# Patient Record
Sex: Male | Born: 1945 | Race: White | Hispanic: No | Marital: Married | Smoking: Never smoker
Health system: Southern US, Community
[De-identification: ages and names within clinical notes are randomized; demographics above are authoritative.]

## PROBLEM LIST (undated history)

## (undated) DIAGNOSIS — R55 Syncope and collapse: Secondary | ICD-10-CM

## (undated) DIAGNOSIS — H269 Unspecified cataract: Secondary | ICD-10-CM

## (undated) DIAGNOSIS — N400 Enlarged prostate without lower urinary tract symptoms: Secondary | ICD-10-CM

## (undated) DIAGNOSIS — I509 Heart failure, unspecified: Secondary | ICD-10-CM

## (undated) DIAGNOSIS — M1990 Unspecified osteoarthritis, unspecified site: Secondary | ICD-10-CM

## (undated) DIAGNOSIS — I311 Chronic constrictive pericarditis: Secondary | ICD-10-CM

## (undated) DIAGNOSIS — I4819 Other persistent atrial fibrillation: Secondary | ICD-10-CM

## (undated) DIAGNOSIS — C649 Malignant neoplasm of unspecified kidney, except renal pelvis: Secondary | ICD-10-CM

## (undated) DIAGNOSIS — Z905 Acquired absence of kidney: Secondary | ICD-10-CM

## (undated) DIAGNOSIS — C801 Malignant (primary) neoplasm, unspecified: Secondary | ICD-10-CM

## (undated) DIAGNOSIS — K7581 Nonalcoholic steatohepatitis (NASH): Secondary | ICD-10-CM

## (undated) DIAGNOSIS — I2510 Atherosclerotic heart disease of native coronary artery without angina pectoris: Secondary | ICD-10-CM

## (undated) DIAGNOSIS — F329 Major depressive disorder, single episode, unspecified: Secondary | ICD-10-CM

## (undated) DIAGNOSIS — K769 Liver disease, unspecified: Secondary | ICD-10-CM

## (undated) DIAGNOSIS — N189 Chronic kidney disease, unspecified: Secondary | ICD-10-CM

## (undated) DIAGNOSIS — M1711 Unilateral primary osteoarthritis, right knee: Secondary | ICD-10-CM

## (undated) DIAGNOSIS — K7460 Unspecified cirrhosis of liver: Secondary | ICD-10-CM

## (undated) DIAGNOSIS — L570 Actinic keratosis: Secondary | ICD-10-CM

## (undated) DIAGNOSIS — IMO0002 Reserved for concepts with insufficient information to code with codable children: Secondary | ICD-10-CM

## (undated) DIAGNOSIS — R339 Retention of urine, unspecified: Secondary | ICD-10-CM

## (undated) DIAGNOSIS — F32A Depression, unspecified: Secondary | ICD-10-CM

## (undated) DIAGNOSIS — J189 Pneumonia, unspecified organism: Secondary | ICD-10-CM

## (undated) HISTORY — DX: Atherosclerotic heart disease of native coronary artery without angina pectoris: I25.10

## (undated) HISTORY — DX: Heart failure, unspecified: I50.9

## (undated) HISTORY — DX: Benign prostatic hyperplasia without lower urinary tract symptoms: N40.0

## (undated) HISTORY — DX: Nonalcoholic steatohepatitis (NASH): K75.81

## (undated) HISTORY — DX: Reserved for concepts with insufficient information to code with codable children: IMO0002

## (undated) HISTORY — DX: Syncope and collapse: R55

## (undated) HISTORY — PX: OTHER SURGICAL HISTORY: SHX169

## (undated) HISTORY — DX: Unspecified cirrhosis of liver: K74.60

## (undated) HISTORY — DX: Chronic constrictive pericarditis: I31.1

## (undated) HISTORY — DX: Unspecified osteoarthritis, unspecified site: M19.90

## (undated) HISTORY — DX: Major depressive disorder, single episode, unspecified: F32.9

## (undated) HISTORY — DX: Depression, unspecified: F32.A

## (undated) HISTORY — DX: Unspecified cataract: H26.9

## (undated) HISTORY — DX: Other persistent atrial fibrillation: I48.19

## (undated) HISTORY — PX: CARDIAC CATHETERIZATION: SHX172

## (undated) HISTORY — DX: Actinic keratosis: L57.0

---

## 1967-09-06 DIAGNOSIS — I311 Chronic constrictive pericarditis: Secondary | ICD-10-CM

## 1967-09-06 HISTORY — PX: PERICARDIECTOMY: SHX2214

## 1967-09-06 HISTORY — DX: Chronic constrictive pericarditis: I31.1

## 1973-09-05 DIAGNOSIS — C4441 Basal cell carcinoma of skin of scalp and neck: Secondary | ICD-10-CM

## 1973-09-05 HISTORY — DX: Basal cell carcinoma of skin of scalp and neck: C44.41

## 1993-09-05 HISTORY — PX: CHOLECYSTECTOMY: SHX55

## 1994-09-05 HISTORY — PX: CARPAL TUNNEL RELEASE: SHX101

## 1994-09-05 HISTORY — PX: CARPOMETACARPAL JOINT ARTHRODESIS: SUR50

## 1995-09-06 DIAGNOSIS — R55 Syncope and collapse: Secondary | ICD-10-CM

## 1995-09-06 HISTORY — DX: Syncope and collapse: R55

## 1999-09-06 DIAGNOSIS — K7460 Unspecified cirrhosis of liver: Secondary | ICD-10-CM

## 1999-09-06 HISTORY — DX: Unspecified cirrhosis of liver: K74.60

## 2000-01-17 ENCOUNTER — Ambulatory Visit (HOSPITAL_COMMUNITY): Admission: RE | Admit: 2000-01-17 | Discharge: 2000-01-17 | Payer: Self-pay | Admitting: Gastroenterology

## 2000-01-17 ENCOUNTER — Encounter: Payer: Self-pay | Admitting: Gastroenterology

## 2000-02-04 HISTORY — PX: ESOPHAGOGASTRODUODENOSCOPY: SHX1529

## 2000-02-04 HISTORY — PX: COLONOSCOPY: SHX174

## 2000-02-09 ENCOUNTER — Encounter: Payer: Self-pay | Admitting: Gastroenterology

## 2000-02-09 ENCOUNTER — Ambulatory Visit (HOSPITAL_COMMUNITY): Admission: RE | Admit: 2000-02-09 | Discharge: 2000-02-09 | Payer: Self-pay | Admitting: Gastroenterology

## 2000-02-09 ENCOUNTER — Encounter (INDEPENDENT_AMBULATORY_CARE_PROVIDER_SITE_OTHER): Payer: Self-pay | Admitting: Specialist

## 2000-02-16 ENCOUNTER — Other Ambulatory Visit: Admission: RE | Admit: 2000-02-16 | Discharge: 2000-02-16 | Payer: Self-pay | Admitting: Gastroenterology

## 2000-02-16 ENCOUNTER — Encounter (INDEPENDENT_AMBULATORY_CARE_PROVIDER_SITE_OTHER): Payer: Self-pay | Admitting: Specialist

## 2001-03-06 ENCOUNTER — Ambulatory Visit (HOSPITAL_BASED_OUTPATIENT_CLINIC_OR_DEPARTMENT_OTHER): Admission: RE | Admit: 2001-03-06 | Discharge: 2001-03-07 | Payer: Self-pay | Admitting: Orthopedic Surgery

## 2002-04-22 ENCOUNTER — Encounter: Payer: Self-pay | Admitting: Gastroenterology

## 2002-04-22 ENCOUNTER — Encounter: Admission: RE | Admit: 2002-04-22 | Discharge: 2002-04-22 | Payer: Self-pay | Admitting: Gastroenterology

## 2002-11-04 ENCOUNTER — Ambulatory Visit (HOSPITAL_COMMUNITY): Admission: RE | Admit: 2002-11-04 | Discharge: 2002-11-04 | Payer: Self-pay | Admitting: Gastroenterology

## 2002-11-04 ENCOUNTER — Encounter: Payer: Self-pay | Admitting: Gastroenterology

## 2004-12-15 ENCOUNTER — Ambulatory Visit: Payer: Self-pay | Admitting: Gastroenterology

## 2004-12-22 ENCOUNTER — Ambulatory Visit: Payer: Self-pay | Admitting: Internal Medicine

## 2004-12-24 ENCOUNTER — Ambulatory Visit: Payer: Self-pay | Admitting: Gastroenterology

## 2004-12-29 ENCOUNTER — Ambulatory Visit: Payer: Self-pay | Admitting: Family Medicine

## 2005-09-05 HISTORY — PX: HEMORROIDECTOMY: SUR656

## 2005-09-05 HISTORY — PX: KNEE SURGERY: SHX244

## 2006-07-06 ENCOUNTER — Ambulatory Visit: Payer: Self-pay | Admitting: Gastroenterology

## 2007-01-28 ENCOUNTER — Emergency Department: Payer: Self-pay | Admitting: Internal Medicine

## 2008-06-27 ENCOUNTER — Ambulatory Visit: Payer: Self-pay | Admitting: Cardiology

## 2008-07-09 ENCOUNTER — Ambulatory Visit: Payer: Self-pay

## 2008-07-09 ENCOUNTER — Encounter: Payer: Self-pay | Admitting: Cardiology

## 2008-07-09 ENCOUNTER — Ambulatory Visit: Payer: Self-pay | Admitting: Cardiology

## 2008-07-09 LAB — CONVERTED CEMR LAB
ALT: 33 units/L (ref 0–53)
AST: 27 units/L (ref 0–37)
Albumin: 4.2 g/dL (ref 3.5–5.2)
Alkaline Phosphatase: 34 units/L — ABNORMAL LOW (ref 39–117)
Bilirubin, Direct: 0.2 mg/dL (ref 0.0–0.3)
Cholesterol: 177 mg/dL (ref 0–200)
HDL: 34.1 mg/dL — ABNORMAL LOW (ref >39.0)
LDL Cholesterol: 120 mg/dL — ABNORMAL HIGH (ref 0–99)
Total Bilirubin: 1.4 mg/dL — ABNORMAL HIGH (ref 0.3–1.2)
Total CHOL/HDL Ratio: 5.2
Total Protein: 6.8 g/dL (ref 6.0–8.3)
Triglycerides: 114 mg/dL (ref 0–149)
VLDL: 23 mg/dL (ref 0–40)

## 2008-07-24 ENCOUNTER — Ambulatory Visit (HOSPITAL_COMMUNITY): Admission: RE | Admit: 2008-07-24 | Discharge: 2008-07-24 | Payer: Self-pay | Admitting: Orthopedic Surgery

## 2008-12-23 ENCOUNTER — Encounter: Payer: Self-pay | Admitting: Internal Medicine

## 2009-04-16 ENCOUNTER — Encounter (INDEPENDENT_AMBULATORY_CARE_PROVIDER_SITE_OTHER): Payer: Self-pay | Admitting: *Deleted

## 2009-10-22 DIAGNOSIS — R002 Palpitations: Secondary | ICD-10-CM | POA: Insufficient documentation

## 2009-10-22 DIAGNOSIS — I311 Chronic constrictive pericarditis: Secondary | ICD-10-CM | POA: Insufficient documentation

## 2009-11-10 ENCOUNTER — Ambulatory Visit: Payer: Self-pay | Admitting: Cardiology

## 2009-11-10 DIAGNOSIS — G4733 Obstructive sleep apnea (adult) (pediatric): Secondary | ICD-10-CM | POA: Insufficient documentation

## 2009-12-01 ENCOUNTER — Ambulatory Visit: Payer: Self-pay | Admitting: Cardiology

## 2009-12-01 ENCOUNTER — Encounter: Payer: Self-pay | Admitting: Cardiology

## 2009-12-01 ENCOUNTER — Ambulatory Visit: Payer: Self-pay | Admitting: Pulmonary Disease

## 2009-12-01 ENCOUNTER — Ambulatory Visit (HOSPITAL_COMMUNITY): Admission: RE | Admit: 2009-12-01 | Discharge: 2009-12-01 | Payer: Self-pay | Admitting: Cardiology

## 2009-12-01 ENCOUNTER — Ambulatory Visit: Payer: Self-pay

## 2009-12-03 ENCOUNTER — Telehealth (INDEPENDENT_AMBULATORY_CARE_PROVIDER_SITE_OTHER): Payer: Self-pay | Admitting: *Deleted

## 2009-12-08 LAB — CONVERTED CEMR LAB
BUN: 22 mg/dL (ref 6–23)
CO2: 29 meq/L (ref 19–32)
Calcium: 9 mg/dL (ref 8.4–10.5)
Chloride: 106 meq/L (ref 96–112)
Creatinine, Ser: 0.8 mg/dL (ref 0.4–1.5)
GFR calc non Af Amer: 103.59 mL/min (ref >60)
Glucose, Bld: 131 mg/dL — ABNORMAL HIGH (ref 70–99)
Magnesium: 2.4 mg/dL (ref 1.5–2.5)
Potassium: 4.2 meq/L (ref 3.5–5.1)
Sodium: 142 meq/L (ref 135–145)
TSH: 1.12 microintl units/mL (ref 0.35–5.50)

## 2009-12-09 ENCOUNTER — Ambulatory Visit: Payer: Self-pay | Admitting: Cardiology

## 2009-12-09 DIAGNOSIS — I480 Paroxysmal atrial fibrillation: Secondary | ICD-10-CM | POA: Insufficient documentation

## 2009-12-09 DIAGNOSIS — I4891 Unspecified atrial fibrillation: Secondary | ICD-10-CM

## 2009-12-21 ENCOUNTER — Ambulatory Visit (HOSPITAL_BASED_OUTPATIENT_CLINIC_OR_DEPARTMENT_OTHER): Admission: RE | Admit: 2009-12-21 | Discharge: 2009-12-21 | Payer: Self-pay | Admitting: Pulmonary Disease

## 2009-12-21 ENCOUNTER — Encounter: Payer: Self-pay | Admitting: Pulmonary Disease

## 2009-12-30 ENCOUNTER — Ambulatory Visit: Payer: Self-pay | Admitting: Internal Medicine

## 2010-01-02 ENCOUNTER — Ambulatory Visit: Payer: Self-pay | Admitting: Pulmonary Disease

## 2010-01-04 ENCOUNTER — Telehealth (INDEPENDENT_AMBULATORY_CARE_PROVIDER_SITE_OTHER): Payer: Self-pay | Admitting: *Deleted

## 2010-01-07 ENCOUNTER — Ambulatory Visit: Payer: Self-pay | Admitting: Pulmonary Disease

## 2010-01-07 DIAGNOSIS — G2581 Restless legs syndrome: Secondary | ICD-10-CM | POA: Insufficient documentation

## 2010-01-26 ENCOUNTER — Ambulatory Visit: Payer: Self-pay | Admitting: Family Medicine

## 2010-02-03 ENCOUNTER — Ambulatory Visit: Payer: Self-pay | Admitting: Internal Medicine

## 2010-04-12 ENCOUNTER — Encounter (INDEPENDENT_AMBULATORY_CARE_PROVIDER_SITE_OTHER): Payer: Self-pay | Admitting: *Deleted

## 2010-04-28 ENCOUNTER — Ambulatory Visit: Payer: Self-pay | Admitting: Internal Medicine

## 2010-04-28 DIAGNOSIS — IMO0002 Reserved for concepts with insufficient information to code with codable children: Secondary | ICD-10-CM

## 2010-04-28 DIAGNOSIS — I4892 Unspecified atrial flutter: Secondary | ICD-10-CM

## 2010-04-28 HISTORY — DX: Reserved for concepts with insufficient information to code with codable children: IMO0002

## 2010-04-28 HISTORY — DX: Unspecified atrial flutter: I48.92

## 2010-04-29 ENCOUNTER — Encounter: Payer: Self-pay | Admitting: Internal Medicine

## 2010-05-19 ENCOUNTER — Ambulatory Visit: Payer: Self-pay | Admitting: Internal Medicine

## 2010-06-04 ENCOUNTER — Ambulatory Visit: Payer: Self-pay | Admitting: Internal Medicine

## 2010-06-10 ENCOUNTER — Ambulatory Visit: Payer: Self-pay | Admitting: Internal Medicine

## 2010-06-11 LAB — CONVERTED CEMR LAB
BUN: 17 mg/dL (ref 6–23)
Basophils Absolute: 0 10*3/uL (ref 0.0–0.1)
Basophils Relative: 0.3 % (ref 0.0–3.0)
CO2: 25 meq/L (ref 19–32)
Calcium: 9 mg/dL (ref 8.4–10.5)
Chloride: 109 meq/L (ref 96–112)
Creatinine, Ser: 0.8 mg/dL (ref 0.4–1.5)
Eosinophils Absolute: 0.3 10*3/uL (ref 0.0–0.7)
Eosinophils Relative: 2.6 % (ref 0.0–5.0)
GFR calc non Af Amer: 99.12 mL/min (ref >60)
Glucose, Bld: 64 mg/dL — ABNORMAL LOW (ref 70–99)
HCT: 43.2 % (ref 39.0–52.0)
Hemoglobin: 15.4 g/dL (ref 13.0–17.0)
INR: 0.8 (ref 0.8–1.0)
Lymphocytes Relative: 26.4 % (ref 12.0–46.0)
Lymphs Abs: 2.7 10*3/uL (ref 0.7–4.0)
MCHC: 35.5 g/dL (ref 30.0–36.0)
MCV: 91 fL (ref 78.0–100.0)
Monocytes Absolute: 1 10*3/uL (ref 0.1–1.0)
Monocytes Relative: 10.2 % (ref 3.0–12.0)
Neutro Abs: 6.2 10*3/uL (ref 1.4–7.7)
Neutrophils Relative %: 60.5 % (ref 43.0–77.0)
Platelets: 190 10*3/uL (ref 150.0–400.0)
Potassium: 3.9 meq/L (ref 3.5–5.1)
Prothrombin Time: 8.5 s — ABNORMAL LOW (ref 9.7–11.8)
RBC: 4.75 M/uL (ref 4.22–5.81)
RDW: 13.3 % (ref 11.5–14.6)
Sodium: 140 meq/L (ref 135–145)
WBC: 10.2 10*3/uL (ref 4.5–10.5)
aPTT: 28.4 s (ref 21.7–28.8)

## 2010-06-15 ENCOUNTER — Ambulatory Visit: Payer: Self-pay | Admitting: Cardiology

## 2010-06-16 ENCOUNTER — Ambulatory Visit (HOSPITAL_COMMUNITY): Admission: RE | Admit: 2010-06-16 | Discharge: 2010-06-16 | Payer: Self-pay | Admitting: Cardiovascular Disease

## 2010-06-16 ENCOUNTER — Encounter: Payer: Self-pay | Admitting: Cardiology

## 2010-06-17 ENCOUNTER — Ambulatory Visit: Payer: Self-pay | Admitting: Internal Medicine

## 2010-06-17 ENCOUNTER — Ambulatory Visit (HOSPITAL_COMMUNITY): Admission: RE | Admit: 2010-06-17 | Discharge: 2010-06-18 | Payer: Self-pay | Admitting: Internal Medicine

## 2010-07-23 ENCOUNTER — Ambulatory Visit: Payer: Self-pay | Admitting: Internal Medicine

## 2010-08-07 ENCOUNTER — Telehealth: Payer: Self-pay | Admitting: Physician Assistant

## 2010-08-10 ENCOUNTER — Telehealth: Payer: Self-pay | Admitting: Internal Medicine

## 2010-08-16 ENCOUNTER — Encounter: Payer: Self-pay | Admitting: Internal Medicine

## 2010-08-19 ENCOUNTER — Ambulatory Visit: Payer: Self-pay | Admitting: Family Medicine

## 2010-08-19 DIAGNOSIS — R61 Generalized hyperhidrosis: Secondary | ICD-10-CM | POA: Insufficient documentation

## 2010-08-27 ENCOUNTER — Telehealth: Payer: Self-pay | Admitting: Internal Medicine

## 2010-08-31 ENCOUNTER — Ambulatory Visit: Payer: Self-pay | Admitting: Family Medicine

## 2010-08-31 ENCOUNTER — Telehealth: Payer: Self-pay | Admitting: Internal Medicine

## 2010-08-31 LAB — CONVERTED CEMR LAB
ALT: 48 units/L (ref 0–53)
AST: 38 units/L — ABNORMAL HIGH (ref 0–37)
Albumin: 4.2 g/dL (ref 3.5–5.2)
Alkaline Phosphatase: 43 units/L (ref 39–117)
BUN: 25 mg/dL — ABNORMAL HIGH (ref 6–23)
Basophils Absolute: 0 10*3/uL (ref 0.0–0.1)
Basophils Relative: 0.4 % (ref 0.0–3.0)
Bilirubin, Direct: 0.2 mg/dL (ref 0.0–0.3)
CO2: 26 meq/L (ref 19–32)
Calcium: 9.3 mg/dL (ref 8.4–10.5)
Chloride: 108 meq/L (ref 96–112)
Cholesterol: 186 mg/dL (ref 0–200)
Creatinine, Ser: 0.8 mg/dL (ref 0.4–1.5)
Direct LDL: 117.8 mg/dL
Eosinophils Absolute: 0.3 10*3/uL (ref 0.0–0.7)
Eosinophils Relative: 3.8 % (ref 0.0–5.0)
GFR calc non Af Amer: 97.69 mL/min (ref >60.00)
Glucose, Bld: 107 mg/dL — ABNORMAL HIGH (ref 70–99)
HCT: 42 % (ref 39.0–52.0)
HDL: 37.7 mg/dL — ABNORMAL LOW (ref >39.00)
Hemoglobin: 14.6 g/dL (ref 13.0–17.0)
Lymphocytes Relative: 22.5 % (ref 12.0–46.0)
Lymphs Abs: 1.9 10*3/uL (ref 0.7–4.0)
MCHC: 34.7 g/dL (ref 30.0–36.0)
MCV: 90.5 fL (ref 78.0–100.0)
Monocytes Absolute: 0.7 10*3/uL (ref 0.1–1.0)
Monocytes Relative: 8.7 % (ref 3.0–12.0)
Neutro Abs: 5.4 10*3/uL (ref 1.4–7.7)
Neutrophils Relative %: 64.6 % (ref 43.0–77.0)
PSA: 2.03 ng/mL (ref 0.10–4.00)
Platelets: 158 10*3/uL (ref 150.0–400.0)
Potassium: 4.4 meq/L (ref 3.5–5.1)
RBC: 4.64 M/uL (ref 4.22–5.81)
RDW: 13.8 % (ref 11.5–14.6)
Sodium: 142 meq/L (ref 135–145)
TSH: 2.17 microintl units/mL (ref 0.35–5.50)
Total Bilirubin: 1.2 mg/dL (ref 0.3–1.2)
Total CHOL/HDL Ratio: 5
Total Protein: 6.2 g/dL (ref 6.0–8.3)
Triglycerides: 203 mg/dL — ABNORMAL HIGH (ref 0.0–149.0)
VLDL: 40.6 mg/dL — ABNORMAL HIGH (ref 0.0–40.0)
WBC: 8.4 10*3/uL (ref 4.5–10.5)

## 2010-09-02 ENCOUNTER — Ambulatory Visit: Admission: RE | Admit: 2010-09-02 | Discharge: 2010-09-02 | Payer: Self-pay | Source: Home / Self Care

## 2010-09-02 LAB — CONVERTED CEMR LAB: POC INR: 1.4

## 2010-09-08 ENCOUNTER — Ambulatory Visit
Admission: RE | Admit: 2010-09-08 | Discharge: 2010-09-08 | Payer: Self-pay | Source: Home / Self Care | Attending: Internal Medicine | Admitting: Internal Medicine

## 2010-09-08 ENCOUNTER — Ambulatory Visit
Admission: RE | Admit: 2010-09-08 | Discharge: 2010-09-08 | Payer: Self-pay | Source: Home / Self Care | Attending: Family Medicine | Admitting: Family Medicine

## 2010-09-08 ENCOUNTER — Ambulatory Visit: Admit: 2010-09-08 | Payer: Self-pay

## 2010-09-08 ENCOUNTER — Ambulatory Visit: Admission: RE | Admit: 2010-09-08 | Discharge: 2010-09-08 | Payer: Self-pay | Source: Home / Self Care

## 2010-09-08 ENCOUNTER — Telehealth (INDEPENDENT_AMBULATORY_CARE_PROVIDER_SITE_OTHER): Payer: Self-pay

## 2010-09-08 LAB — CONVERTED CEMR LAB: POC INR: 1.7

## 2010-09-09 ENCOUNTER — Encounter: Payer: Self-pay | Admitting: Internal Medicine

## 2010-09-09 ENCOUNTER — Ambulatory Visit: Admission: RE | Admit: 2010-09-09 | Discharge: 2010-09-09 | Payer: Self-pay | Source: Home / Self Care

## 2010-09-09 ENCOUNTER — Encounter (HOSPITAL_COMMUNITY)
Admission: RE | Admit: 2010-09-09 | Discharge: 2010-10-05 | Payer: Self-pay | Source: Home / Self Care | Attending: Internal Medicine | Admitting: Internal Medicine

## 2010-09-09 ENCOUNTER — Ambulatory Visit: Payer: Self-pay | Admitting: Cardiology

## 2010-09-10 ENCOUNTER — Encounter: Payer: Self-pay | Admitting: Internal Medicine

## 2010-09-13 ENCOUNTER — Encounter: Payer: Self-pay | Admitting: Internal Medicine

## 2010-09-14 ENCOUNTER — Encounter (INDEPENDENT_AMBULATORY_CARE_PROVIDER_SITE_OTHER): Payer: Self-pay | Admitting: Pharmacist

## 2010-09-14 ENCOUNTER — Telehealth (INDEPENDENT_AMBULATORY_CARE_PROVIDER_SITE_OTHER): Payer: Self-pay | Admitting: *Deleted

## 2010-09-15 ENCOUNTER — Encounter: Payer: Self-pay | Admitting: Cardiology

## 2010-09-15 ENCOUNTER — Ambulatory Visit: Admit: 2010-09-15 | Payer: Self-pay

## 2010-09-15 DIAGNOSIS — I4819 Other persistent atrial fibrillation: Secondary | ICD-10-CM

## 2010-09-15 HISTORY — DX: Other persistent atrial fibrillation: I48.19

## 2010-09-15 HISTORY — PX: OTHER SURGICAL HISTORY: SHX169

## 2010-09-15 HISTORY — PX: ATRIAL FIBRILLATION ABLATION: EP1191

## 2010-09-15 LAB — CONVERTED CEMR LAB
POC INR: 2.7
Prothrombin Time: 33 s

## 2010-09-16 ENCOUNTER — Encounter: Payer: Self-pay | Admitting: Internal Medicine

## 2010-09-17 ENCOUNTER — Encounter (INDEPENDENT_AMBULATORY_CARE_PROVIDER_SITE_OTHER): Payer: Self-pay | Admitting: *Deleted

## 2010-09-21 ENCOUNTER — Ambulatory Visit: Admission: RE | Admit: 2010-09-21 | Discharge: 2010-09-21 | Payer: Self-pay | Source: Home / Self Care

## 2010-09-21 LAB — CONVERTED CEMR LAB: POC INR: 1.1

## 2010-09-22 ENCOUNTER — Encounter: Payer: Self-pay | Admitting: Internal Medicine

## 2010-09-22 ENCOUNTER — Ambulatory Visit
Admission: RE | Admit: 2010-09-22 | Discharge: 2010-09-22 | Payer: Self-pay | Source: Home / Self Care | Attending: Internal Medicine | Admitting: Internal Medicine

## 2010-09-27 ENCOUNTER — Encounter: Payer: Self-pay | Admitting: Internal Medicine

## 2010-09-28 ENCOUNTER — Ambulatory Visit: Admit: 2010-09-28 | Payer: Self-pay

## 2010-10-07 NOTE — Progress Notes (Signed)
----   Converted from flag ---- ---- 08/27/2010 4:02 PM, Janan Halter, RN, BSN wrote: pt needs to start Coumadin per Dr Charise Killian prior to afib ablation Thanks Claiborne Billings currently on Pradaxa ------------------------------  Phone Note Outgoing Call   Call placed by: Porfirio Oar PharmD,  August 27, 2010 4:21 PM Call placed to: Patient's Wife Summary of Call: Spoke with pt's wife.  Explained pt will need to switch from Pradaxa to Coumadin.  Stated pt will probably wait until beginning of next week to change.  This is okay.  Instructed to take Pradaxa and Coumadin x 3 days then just take Coumadin.  Appt make for 1 week after starting Coumadin.   Initial call taken by: Porfirio Oar PharmD,  August 27, 2010 4:24 PM    New/Updated Medications: WARFARIN SODIUM 5 MG TABS (WARFARIN SODIUM) Use as directed by Anticoagulation Clinic Prescriptions: WARFARIN SODIUM 5 MG TABS (WARFARIN SODIUM) Use as directed by Anticoagulation Clinic  #45 x 1   Entered by:   Porfirio Oar PharmD   Authorized by:   [name], MD   Signed by:   Porfirio Oar PharmD on 08/27/2010   Method used:   Electronically to        [facility] [phone]* (retail)       [address] [place],   [address]       Ph: [phone] or [phone]       Fax: [phone]   RxID:   [id number]   Appended Document:  Per Karen Kays, Dr Omelia Blackwater nurse at [facility] pt is S/C for Ablation on 09/15/10 at [facility]. Please send/ fax all INR's to her at [phone] and to EP group at [phone]

## 2010-10-07 NOTE — Assessment & Plan Note (Signed)
Summary: eval afib   Visit Type:  Follow-up Referring Provider:  Dr. Jenell Milliner Primary Provider:  Teresa Pelton   History of Present Illness: Mr Busler is a pleasant 65 yo WM with a h/o prior surgery for constrictive pericarditis and paroxysmal atrial fibrillation who presents today for Ep consultation regarding his afib.  He reports initially having atrial fibrillation in the late 1980s.  He was placed on coumadin and quinidine after evaluation at [facility].  He did well, without afib for several years.  He then stopped quinidine in the early 1990s.  He reports having increasing frequency and duration of afib.  He feels that his afib has been most noticable over the past 6 months.  He is unaware of triggers or precipitants for his afib.  He does not drink ETOH.  He has been recently tested for sleep apnea.   During his afib, he reports symptoms of nervousness, SOB, palpitations, dizziness, and fatigue.  Episodes typically last several hours.  The patient denies symptoms of chest pain, orthopnea, PND, lower extremity edema,  presyncope, syncope, or neurologic sequela. The patient is tolerating medications without difficulties and is otherwise without complaint today.   Current Medications (verified): 1)  Flomax 0.4 Mg Caps (Tamsulosin Hcl) .Marland Kitchen.. 1 Tab Two Times A Day 2)  Zoloft 50 Mg Tabs (Sertraline Hcl) .Marland Kitchen.. 1 Tab Once Daily 3)  Triazolam 0.25 Mg Tabs (Triazolam) .Marland Kitchen.. 1 Tab At Bedtime 4)  Aspirin 81 Mg Tbec (Aspirin) .... Take One Tablet By Mouth Daily 5)  Diltiazem Hcl Er Beads 240 Mg Xr24h-Cap (Diltiazem Hcl Er Beads) .Marland Kitchen.. 1 Once Daily  Allergies (verified): No Known Drug Allergies  Past History:  Past Medical History: Paroxysmal atrial fibrillation + rheumatic fever as a child CONSTRICTIVE PERICARDITIS (unknown cause) s/p pericardectomy 1969 Depression DJD BPH ?liver cirrhosis diagnosed at time of cholecystectomy recurrent syncope s/p Tilt study by Dr [name] 1997 (likely due to  dysautonomia) Gout  Past Surgical History: Pericardioectomy 1969 at [facility] he had osteoarthritis with left  knee surgery in 2007,  hemorrhoidectomy in 2007, cholecystectomy in 1995,  right thumb surgery for osteoarthritis  Family History: Reviewed history from 10/22/2009 and no changes required.  Positive for his father having a heart attack at age  73.   Social History: Pt lives in [place] with spouse.  He does not smoke.   He does not drink.   He enjoys walking on a regular basis.   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    Review of Systems       All systems are reviewed and negative except as listed in the HPI.   Vital Signs:  Patient profile:   65 year old male Height:      67 inches Weight:      169 pounds BMI:     26.56 Pulse rate:   64 / minute BP sitting:   120 / 70  (left arm)  Vitals Entered By: Margaretmary Bayley CMA (December 30, 2009 2:46 PM)  Physical Exam  General:  Well developed, well nourished, in no acute distress. Head:  normocephalic and atraumatic Eyes:  PERRLA/EOM intact; conjunctiva and lids normal. Mouth:  Teeth, gums and palate normal. Oral mucosa normal. Neck:  Neck supple, no JVD. No masses, thyromegaly or abnormal cervical nodes. Lungs:  Clear bilaterally to auscultation and percussion. Heart:  Non-displaced PMI, chest non-tender; regular rate and rhythm, S1, S2 without murmurs, rubs or gallops. Carotid upstroke normal,  no bruit. Normal abdominal aortic size, no bruits. Femorals normal pulses, no bruits. Pedals normal pulses. No edema, no varicosities. Abdomen:  Bowel sounds positive; abdomen soft and non-tender without masses, organomegaly, or hernias noted. No hepatosplenomegaly. Msk:  Back normal, normal gait. Muscle strength and tone normal. Pulses:  pulses normal in all 4 extremities Extremities:  No clubbing or cyanosis. Neurologic:  Alert and oriented x 3. Skin:  Intact without lesions or  rashes. Cervical Nodes:  no significant adenopathy Psych:  Normal affect.   Stress Echocardiogram  Procedure date:  07/09/2008  Findings:        CONCLUSIONS    SUMMARY   Duration of exercise was 9 min and 15 sec. Target heart rate was   achieved. There was resting hypertension with a hypertensive blood   pressure response to stress. There was no chest pain during stress.   The stress ECG was negative for ischemia. Estimated left ventricular   ejection fraction was in the range of 55 % to 65 %. There was no   diagnostic evidence for left ventricular regional wall motion   abnormalities at peak stress.    ---------------------------------------------------------------    Prepared and Electronically Authenticated by    Rozann Lesches M.D.   Confirmed July 09, 2008 16:54:03   [place] of Abdomen  Procedure date:  01/17/2000  Findings:      [place] [employer]  COMPLETE ABDOMINAL ULTRASOUND:   NO PRIOR STUDIES.   THE AORTA DEMONSTRATES A NORMAL TAPERING COURSE.  LIVER   MEASURES 13.1 CM IN LENGTH AND THE SPLEEN MEASURES 14 CM   IN LENGTH.  NO INTRAHEPATIC BILE DUCT DILATATION.  THE   PATIENT HAS HAD A PRIOR CHOLECYSTECTOMY.  COMMON BILE DUCT   MEASURES 3.7 MM, WHICH IS WITHIN NORMAL LIMITS.  NO   DEFINITE ASCITES IS IDENTIFIED.   THE PANCREAS APPEARS UNREMARKABLE.   RIGHT KIDNEY MEASURES 10.6 CM IN GREATEST LENGTH.  LEFT   KIDNEY MEASURES 12.2 CM IN GREATEST LENGTH.  NO EVIDENCE   OF RENAL STONES, MASSES OR HYDRONEPHROSIS.  THE LIVER IS   SOMEWHAT ECHOGENIC, LIKELY REFLECTING FATTY INFILTRATION.   IMPRESSION:  1.  SLIGHTLY ECHOGENIC LIVER, LIKELY   SECONDARY TO DIFFUSE FATTY INFILTRATION.   2.  I DO NOT DEMONSTRATE A NODULE AT THE LIVER CONTOUR AT   THIS TIME.  NO DEFINITE VARICES.   [place] of Abdomen  Procedure date:  01/17/2000  Findings:       ABDOMINAL VASCULAR  ULTRASOUND:   MAIN PORTAL VEIN MEASURES 11.9 CM IN DIAMETER.  THERE IS   HEPATOPETAL FLOW,  AND NO EVIDENCE OF THROMBUS.  FLOW RATE   IS 20 CM/SEC. IN THE PROXIMAL AND MID PORTAL VEIN AND 20.8   CM/SEC. IN THE DISTAL MAIN PORTAL VEIN.  RIGHT AND LEFT   PORTAL VEINS DEMONSTRATE EXPECTED HEPATOPETAL FLOW.   SPLENIC VEIN IS VISUALIZED AND DEMONSTRATES ANTEGRADE   FLOW.  SPLENIC VEIN HILUM APPEARS NORMAL.  NO VARICES OR   ASCITES IDENTIFIED.   THE RIGHT, LEFT AND MIDDLE HEPATIC VEINS DEMONSTRATE   HEPATOFUGAL FLOW AND TRIPHASIC WAVE FORMS.   COMMON HEPATIC ARTERY AND IVC APPEAR UNREMARKABLE.   IMPRESSION:   NO ULTRASONOGRAPHIC EVIDENCE OF THROMBUS OR VARICES.  FLOW   SIGNAL IN THE PORTAL AND HEPATIC VENOUS SYSTEMS APPEARS   WITHIN NORMAL LIMITS.   TRANSCRIBED DATE:  RXA  BWT  Read TG:[id number] D.   Janeece Fitting, M.D.  01/17/00   Tilt Table Test  Procedure date:  12/05/1994  Findings:      Impression: Positive head-up tilt table test with a vasodepressor and cardioinhibitory component requiring isoproterenol.  Jolyn Nap, MD    Echocardiogram  Procedure date:  12/01/2009  Findings:       Study Conclusions    - Left ventricle: The cavity size was normal. Wall thickness was     normal. Systolic function was normal. The estimated ejection     fraction was 60%. Wall motion was normal; there were no regional     wall motion abnormalities. Features are consistent with a     pseudonormal left ventricular filling pattern, with concomitant     abnormal relaxation and increased filling pressure (grade 2     diastolic dysfunction).   - Aortic valve: There was no stenosis.   - Mitral valve: Mild regurgitation.   - Left atrium: The atrium was moderately dilated. LA volume 75 mL.   - Pulmonary arteries: PA peak pressure: 38m Hg (S).   - Inferior vena cava: The vessel was normal in size; the     respirophasic diameter changes were in the normal range (= 50%);     findings are consistent with normal central venous pressure.    Impressions:    - Normal LV size and systolic function, EF 637% Moderate diastolic     dysfunction. Moderate left atrial enlargement by atrial volume.     There is no interventricular interdependence noted and the IVC is     not dilated, which make constrictive pericarditis less likely. The     RV appears normal in size and systolic function. No pulmonary     hypertension.   Transthoracic echocardiography. M-mode, complete 2D, spectral   Doppler, and color Doppler. Height: Height: 170.2cm. Height: 67in.   Weight: Weight: 77.6kg. Weight: 170.6lb. Body mass index: BMI:   26.8kg/m^2. Body surface area: BSA: 1.867m. Blood pressure: 130/80.   Patient status: Outpatient. [facility]       Impression & Recommendations:  Problem # 1:  ATRIAL FIBRILLATION (ICD-427.31) Mr LoHaslamas sypmtomatic paroxysmal atrial fibrillation.  Therapeutic strategies for afib including medicine and ablation were discussed in detail with the patient today. Risk, benefits, and alternatives to EP study and radiofrequency ablation for afib were also discussed in detail today.  At this point, we will try medical therapy.  He will continue cardizem for rate control.  In addition, we will add flecainide 10069mwo times a day.  He will return in 4 wks.  If flecainide has been effective, we will then plan GXT myoview.  The patients CHADS2 score is 0.  He is appropriately anticoagulated with ASA 325m53mily in accordance with AHA/ACC guidelines. Given his h/o rheumatic fever, constrictive pericarditis, and moderate LA enlargement, our ability to maintain sinus rhythm longterm may be limited.  Problem # 2:  OBSTRUCTIVE SLEEP APNEA (ICD-327.23) results of sleep study are pending treatment of sleep apnea will be important to maintaining sinus rhythm  Patient Instructions: 1)  Your physician has recommended you make the following change in your medication: Start flecainide 100mg44m times a day . 2)  Your  physician recommends that you schedule a follow-up appointment in: 4 weeks with Dr AllreRayann Hemancriptions: FLECAINIDE ACETATE 100 MG TABS (FLECAINIDE ACETATE) one by mouth two times a day  #60 x 6   Entered by:   KellyJanan Halter BSN  Authorized by:   [name], MD   Signed by:   Janan Halter, RN, BSN on 12/30/2009   Method used:   Electronically to        [facility] [place] (retail)       [address] [place], [place]  [address]       Ph: [phone]       Fax: [phone]   RxID:   [id number]

## 2010-10-07 NOTE — Progress Notes (Signed)
Summary: Pt wife request call  Phone Note Call from Patient Call back at [phone]   Caller: Spouse/ Patsy Summary of Call: Pt wife request call Initial call taken by: Delsa Sale,  August 27, 2010 10:51 AM  Follow-up for Phone Call        will order the test for after Christmas Janan Halter, RN, BSN  August 27, 2010 4:01 PM

## 2010-10-07 NOTE — Progress Notes (Signed)
Summary: Nuc. Pre-Procedure  Phone Note Outgoing Call Call back at [facility] Phone [phone]   Call placed by: Irean Hong, RN,  September 08, 2010 3:28 PM Summary of Call: Left message with information on Myoview Information Sheet (see scanned document for details).      Nuclear Med Background Indications for Stress Test: Evaluation for Ischemia  Indications Comments: Pending ablation 09/15/10 (AFIB) by Dr. Drema Pry [phone].  History: Ablation, Echo  History Comments: 07/09/08 Stress Echo: NEG, EF=55-65%. 10/11 Echo:EF=55%. 10/11 Ablation for atrial Flutter. Hx. AFIB.  Symptoms: Fatigue    Nuclear Pre-Procedure Cardiac Risk Factors: Family History - CAD, Hypertension, Lipids Height (in): 67  Nuclear Med Study Referring MD:  Allred

## 2010-10-07 NOTE — Letter (Signed)
Summary: [facility]   Imported By: Marilynne Drivers 09/20/2010 15:16:04  _____________________________________________________________________  External Attachment:    Type:   Image     Comment:   External Document

## 2010-10-07 NOTE — Assessment & Plan Note (Signed)
Summary: CPX/DLO   Vital Signs:  Patient profile:   65 year old male Weight:      178.25 pounds Temp:     97.4 degrees F oral Pulse rate:   68 / minute Pulse rhythm:   regular BP sitting:   106 / 60  (left arm) Cuff size:   large  Vitals Entered By: Sydell Axon LPN (September 08, 2010 11:55 AM) CC: 30 Minute checkup, had a colonoscopy about 3 years ago per patient and wife  Vision Screening:Left eye with correction: 20 / 30 Right eye with correction: 20 / 25 Both eyes with correction: 20 / 25       25db HL: Left  500 hz: 25db 1000 hz: 25db 2000 hz: No Response 4000 hz: No Response Right  500 hz: 25db 1000 hz: 25db 2000 hz: No Response 4000 hz: No Response    History of Present Illness: Pty here with wife for Comp Exam. He is now on Coumadin altho subtherapeutic. He is looking to have ablation done next Wed, the second one he has had. This will be done at [facility]. He will have a TEE done as well.  He still has congestion at night with laying down. He continues with coughing and clearing his throat all the time. Nasal congestion the worse.  He asked about Cialis.  He continues to have heart palps an has night sweats.  He is bloated by evening..he has lots of gas as well.   Preventive Screening-Counseling & Management  Alcohol-Tobacco     Alcohol drinks/day: 0     Smoking Status: never  Caffeine-Diet-Exercise     Caffeine use/day: 1  Rare Coca cola     Does Patient Exercise: yes     Type of exercise: walking and tennis     Exercise (avg: min/session): <30 of walking and 2 hrs with tennis.  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 1 Tab Two Times A Day 2)  Diltiazem Hcl Er Beads 240 Mg Xr24h-Cap (Diltiazem Hcl Er Beads) .Marland Kitchen.. 1 By Mouth Two Times A Day 3)  Requip 0.5 Mg  Tabs (Ropinirole Hcl) .... As Directed 4)  Zoloft 100 Mg Tabs (Sertraline Hcl) .... Take One By Mouth Daily 5)  Pradaxa 150 Mg Caps (Dabigatran Etexilate Mesylate) .... One By Mouth Two Times A Day 6)  Metoprolol Tartrate 25 Mg Tabs (Metoprolol Tartrate) .... Take One Tablet By Mouth Three Times Daily 7)  Ambien 5 Mg Tabs (Zolpidem Tartrate) .... One By Mouth At Bedtime 8)  Warfarin Sodium 5 Mg Tabs (Warfarin Sodium) .... Use As Directed By Anticoagulation Clinic  Current Medications (verified): 1)  Flomax 0.4 Mg Caps (Tamsulosin Hcl) .Marland Kitchen.. 1 Tab Two Times A Day 2)  Diltiazem Hcl Er Beads 240 Mg Xr24h-Cap (Diltiazem Hcl Er Beads) .Marland Kitchen.. 1 By Mouth Two Times A Day 3)  Requip 0.5 Mg  Tabs (Ropinirole Hcl) .... As Directed 4)  Zoloft 100 Mg Tabs (Sertraline Hcl) .... Take One By Mouth Daily 5)  Metoprolol Tartrate 25 Mg Tabs (Metoprolol Tartrate) .... Take  One Tablet By Mouth Three Times Daily 6)  Warfarin Sodium 5 Mg Tabs (Warfarin Sodium) .... Use As Directed By Anticoagulation Clinic  Allergies: No Known Drug Allergies  Past History:  Past Medical History: Last updated: 07/23/2010 Persistent atrial fibrillation typical appearing atrial flutter dx 04/28/10 s/p CTI ablation 10/11 + rheumatic fever as a child CONSTRICTIVE PERICARDITIS (unknown cause) s/p pericardectomy 1969 Depression DJD BPH ?liver cirrhosis diagnosed at time of cholecystectomy recurrent syncope s/p Tilt study by Dr Graciela Husbands 1997 (likely due to dysautonomia) Gout  Past Surgical History: Last updated: 12/30/2009 Pericardioectomy [facility] at [facility] he had osteoarthritis with left  knee surgery in 2007,  hemorrhoidectomy in 2007, cholecystectomy in  1995,  right thumb surgery for osteoarthritis  Family History: Last updated: 09/08/2010 Father dec 52 MI (Etoh and smoker) Mother A 62 Htn Sister A 71 Brother A 59 Positive for his father having a heart attack at age  3.   Social History: Last updated: 12/30/2009 Pt lives in [place] with spouse.  He does not smoke.   He does not drink.   He enjoys walking on a regular basis.   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    Risk Factors: Alcohol Use: 0 (09/08/2010) Caffeine Use: 1  Rare Coca cola (09/08/2010) Exercise: yes (09/08/2010)  Risk Factors: Smoking Status: never (09/08/2010)  Family History: Father dec 52 MI (Etoh and smoker) Mother A 31 Htn Sister A 40 Brother A 59 Positive for his father having a heart attack at age  65.   Social History: Smoking Status:  never Caffeine use/day:  1  Rare Coca cola Does Patient Exercise:  yes  Review of Systems General:  Complains of fatigue and sweats; denies chills, fever, weakness, and weight loss; see HPI. Eyes:  Denies blurring, discharge, and eye pain; wears glasses and early cataracts. ENT:  Complains of decreased hearing; denies ear discharge and earache. CV:  Complains of fainting, fatigue, palpitations, and swelling of hands; denies chest pain or discomfort and swelling of feet; swelling of face and abd also.  Has h/o cardiac syncope.Marland Kitchen Resp:  Complains of shortness of breath; denies cough and wheezing; mild probbalby cardiac.. GI:  Complains of diarrhea and nausea; denies abdominal pain, bloody stools, change in bowel habits, constipation, dark tarry stools, excessive appetite, gas, hemorrhoids, indigestion, loss of appetite, vomiting, vomiting blood, and yellowish skin color; mild diarrhea and nausea. Has signif hemms.. GU:  Complains of nocturia and urinary frequency; denies discharge and dysuria; takes Flomax.. MS:  Complains of joint pain and low back pain; denies muscle aches,  cramps, and stiffness. Derm:  Complains of dryness and itching; denies rash. Neuro:  Denies numbness, poor balance, tingling, and tremors.  Physical Exam  General:  Well-developed,well-nourished,in no acute distress; alert,appropriate and cooperative throughout examination, does not seem  congested. Head:  Normocephalic and atraumatic without obvious abnormalities. No apparent alopecia but mild  balding. Sinuses NT. Eyes:  Conjunctiva clear bilaterally.  Ears:  External ear exam shows no significant lesions or deformities.  Otoscopic examination reveals clear canals, tympanic membranes are intact bilaterally without bulging, retraction, inflammation or discharge. Hearing is grossly normal bilaterally. Nose:  External nasal examination shows no deformity or inflammation. Nasal mucosa are pink and moist without lesions or exudates. Swollen membranes on the left. Mouth:  Oral mucosa and oropharynx without lesions or exudates.  Teeth in good repair. Neck:  Neck supple, no JVD. No masses, thyromegaly or abnormal cervical nodes. Chest  Wall:  No deformities, masses, tenderness or gynecomastia noted. Breasts:  No masses or gynecomastia noted Lungs:  Clear bilaterally to auscultation and percussion. Heart:  Non-displaced PMI, chest non-tender, irregular rate and rhythm, S1, S2 without murmurs, rubs or gallops. Carotid upstroke normal, no bruit. Normal abdominal aortic size, no bruits. Femorals normal pulses, no bruits. Pedals normal pulses. No edema, no varicosities. Abdomen:  Bowel sounds positive,abdomen soft and non-tender without masses, organomegaly or hernias noted. Minimally tender over the RUQ, no bruits heard. Rectal:   Normal sphincter tone. No rectal masses or tenderness. G neg. Mildly tender and inflamed small ext hemms. Genitalia:  Testes bilaterally descended without nodularity, tenderness or masses. No scrotal masses or lesions. No penis lesions or urethral discharge. Prostate:  Prostate  gland firm and smooth, no enlargement, nodularity, tenderness, mass, asymmetry or induration. 20 gms. Msk:  No deformity or scoliosis noted of thoracic or lumbar spine.   Pulses:  R and L carotid,radial,femoral,dorsalis pedis and posterior tibial pulses are full and equal bilaterally Extremities:  No clubbing, cyanosis, edema, or deformity noted with normal full range of motion of all joints.   Neurologic:  No cranial nerve deficits noted. Station and gait are normal. Sensory, motor and coordinative functions appear intact. Skin:  Intact without suspicious lesions or rashes Cervical Nodes:  No lymphadenopathy noted Inguinal Nodes:  No significant adenopathy Psych:  Cognition and judgment appear intact. Alert and cooperative with normal attention span and concentration. No apparent delusions, illusions, hallucinations   Impression & Recommendations:  Problem # 1:  PREVENTIVE HEALTH CARE (ICD-V70.0) Assessment Comment Only  I have personally reviewed the Medicare Annual Wellness questionnaire and have noted 1.   The patient's medical and social history 2.   Their use of alcohol, tobacco or illicit drugs 3.   Their current medications and supplements 4.   The patient's functional ability including ADL's, fall risks, home safety risks and hearing or visual             impairment. Has known hearing impairment. Discussed seeing Audiometry. 5.   Diet and physical activities 6.   Evidence for depression or mood disorders   Orders: Welcome to [employer], Physical [phone])  Problem # 2:  SPECIAL SCREENING MALIGNANT NEOPLASM OF PROSTATE (ICD-V76.44) Assessment: Unchanged Stable exam and PSA.  Problem # 3:  NIGHT SWEATS (ICD-780.8) Assessment: Improved Slightly improved with better palp control.   Problem # 4:  RESTLESS LEGS SYNDROME (ICD-333.94) Assessment: Unchanged Does not have real problems with this and stopped Requip as it was not making a difference.  Problem # 5:  ATRIAL  FIBRILLATION (ICD-427.31) Assessment: Unchanged  To have ablation done next week, presuming Coumadin therapeutic. Will need TEE prior according to note. Discussed. His updated medication list for this problem includes:    Diltiazem Hcl Er Beads 240 Mg Xr24h-cap (Diltiazem hcl er beads) .Marland Kitchen... 1 by mouth two times a day    Metoprolol Tartrate 25 Mg Tabs (Metoprolol tartrate) .Marland Kitchen... Take one tablet by mouth three times daily    Warfarin Sodium 5 Mg Tabs (Warfarin sodium) ..... Use as directed by anticoagulation clinic  Reviewed the following: PT: 8.5 (06/10/2010)   INR: 0.8 ratio (06/10/2010) Coumadin Dose (weekly): 35 mg (09/08/2010) Prior Coumadin Dose (weekly): 35 mg (09/08/2010) Next Protime: 09/15/2010 (dated on 09/08/2010)  Problem # 6:  OBSTRUCTIVE SLEEP APNEA (ICD-327.23) Assessment: Unchanged Sleeping fitfully but sounds like more from congestion at night than from restless legs.Discussed simple trmts to help with congestion. See instructions.  Complete  Medication List: 1)  Flomax 0.4 Mg Caps (Tamsulosin hcl) .Marland Kitchen.. 1 tab two times a day 2)  Diltiazem Hcl Er Beads 240 Mg Xr24h-cap (Diltiazem hcl er beads) .Marland Kitchen.. 1 by mouth two times a day 3)  Requip 0.5 Mg Tabs (Ropinirole hcl) .... As directed 4)  Zoloft 100 Mg Tabs (Sertraline hcl) .... Take one by mouth daily 5)  Metoprolol Tartrate 25 Mg Tabs (Metoprolol tartrate) .... Take one tablet by mouth three times daily 6)  Warfarin Sodium 5 Mg Tabs (Warfarin sodium) .... Use as directed by anticoagulation clinic  Other Orders: Audiometry [phone]) Vision Screening ([address]) Prescription Created Electronically [phone])  Patient Instructions: 1)  Cont Guaifenesin 2)  Take Tyl 500mg  2 three times a day  3)  Use nasal saline as discussed. 4)  Start Zyrtec 10mg  (Cetirizine) OTC at night. 5)  Try Eucerin Cream for dry skin. 6)  Needs immunization updating after ablation. Prescriptions: DILTIAZEM HCL ER BEADS 240 MG XR24H-CAP (DILTIAZEM HCL  ER BEADS) 1 by mouth two times a day  #60 x 3   Entered and Authorized by:   Shaune Leeks MD   Signed by:   Shaune Leeks MD on 09/08/2010   Method used:   Electronically to        [facility] [phone]* (retail)       [address] [place], [place]  [address]       Ph: [phone]       Fax: [phone]   RxID:   [id number]    Orders Added: 1)  Audiometry [92552] 2)  Vision Screening [phone] 3)  Welcome to Medicare, Physical [G0402] 4)  Prescription Created Electronically [phone]    Current Allergies (reviewed today): No known allergies

## 2010-10-07 NOTE — Medication Information (Signed)
Summary: Coumadin Clinic  Anticoagulant Therapy  Managed by: Bethena Midget, RN, BSN Referring MD: Hillis Range, MD PCP: Laurita Quint Supervising MD: Cassell Clement, MD Indication 1: Atrial Fibrillation Lab Used: [facility]  [place] Site: [address] PT 33.0 INR POC 2.7 INR RANGE 2.0-3.0     Recent/future hospitalizations: yes       Details: pt pending ablation at [facility] on 09/15/10      Comments: INR was 3.0 on 09/14/10 from pre-op labs [facility] faxed results.   Allergies: 1)  Penicillin  Anticoagulation Management History:      His anticoagulation is being managed by telephone today.  Negative risk factors for bleeding include an age less than 63 years old.  The bleeding index is 'low risk'.  Negative CHADS2 values include Age > 22 years old.  His last INR was 0.8 ratio.  Prothrombin time is 33.0.  Anticoagulation responsible provider: Cassell Clement, MD.  INR POC: 2.7.    Anticoagulation Management Assessment/Plan:      The patient's current anticoagulation dose is Warfarin sodium 5 mg tabs: Use as directed by Anticoagulation Clinic.  The target INR is 2.0-3.0.  The next INR is due 09/21/2010.  Anticoagulation instructions were given to spouse.  Results were reviewed/authorized by Bethena Midget, RN, BSN.  He was notified by Bethena Midget, RN, BSN.         Prior Anticoagulation Instructions: INR 1.7 Coumadin 6mg  taoday  Then Coumadin 5mg  = 1 tab each day   Current Anticoagulation Instructions: 09/17/10:  Spoke with wife and she states that pt received Lovenox on Monday and Tuesday per [facility]. Ablation was on Wed, Discharged on Thurs. [facility] discharged pt home per wife on 2.5mg s daily and recheck on Tues., thus appt made.

## 2010-10-07 NOTE — Progress Notes (Signed)
  Recieved Request for Records from [facility] to [facility] for processing [facility]  December 03, 2009 11:03 AM

## 2010-10-07 NOTE — Assessment & Plan Note (Signed)
Summary: Cardiology Nuclear Testing  Nuclear Med Background Indications for Stress Test: Evaluation for Ischemia  Indications Comments: Pending ablation 09/15/10 by Dr. Florene Route at [facility] [phone].  History: Ablation, Echo, GXT, Heart Catheterization  History Comments: 10/11 Echo:EF=55%; 10/11 Ablation for atrial flutter; h/o afib.  Symptoms: Chest Pressure, Chest Tightness, DOE, Fatigue, Palpitations, Rapid HR  Symptoms Comments: Last episode of CP:2 weeks ago.   Nuclear Pre-Procedure Cardiac Risk Factors: Family History - CAD, Hypertension, Lipids Caffeine/Decaff Intake: None NPO After: 7:00 PM Lungs: Clear.  O2 Sat 97% on RA. IV 0.9% NS with Angio Cath: 20g     IV Site: R Hand IV Started by: Irven Baltimore, RN Chest Size (in) 42     Height (in): 67 Weight (lb): 172 BMI: 27.04 Tech Comments: Held metoprolol this am.  Nuclear Med Study 1 or 2 day study:  1 day     Stress Test Type:  Treadmill/Lexiscan Reading MD:  Dorris Carnes, MD     Referring MD:  [name], MD Resting Radionuclide:  Technetium 71mTetrofosmin     Resting Radionuclide Dose:  11.0 mCi  Stress Radionuclide:  Technetium 912metrofosmin     Stress Radionuclide Dose:  33.0 mCi   Stress Protocol Exercise Time (min):  2:00 min     Max HR:  136 bpm     Predicted Max HR:  15514pm  Max Systolic BP: 92 mm Hg     Percent Max HR:  87.18 %Rate Pressure Product:  [id number]: 0.4 mg   Stress Test Technologist:  ShValetta FullerCMA-N     Nuclear Technologist:  ToAnnye RuskCNMT  Rest Procedure  Myocardial perfusion imaging was performed at rest 45 minutes following the intravenous administration of Technetium 9918mtrofosmin.  Stress Procedure  The patient received IV Lexiscan 0.4 mg over 15-seconds with concurrent low level exercise and then Technetium 66m51mrofosmin was injected at 30-seconds.  There were no significant changes with Lexiscan.  Quantitative spect images were obtained after a 45 minute  delay.  QPS Raw Data Images:  Images were motion corrected.  Soft tissue (diaphragm) underlies heart. Stress Images:  Normal homogeneous uptake in all areas of the myocardium.  Note RV is prominent. Rest Images:  Normal homogeneous uptake in all areas of the myocardium. Subtraction (SDS):  No evidence of ischemia. Transient Ischemic Dilatation:  0.90  (Normal <1.22)  Lung/Heart Ratio:  0.32  (Normal <0.45)  Quantitative Gated Spect Images QGS cine images:  not gated   Overall Impression  Exercise Capacity: Lexiscan with low level exercise BP Response: Normal blood pressure response. Clinical Symptoms: Mild chest pressure ECG Impression: No significant ST segment change suggestive of ischemia. Overall Impression: Normal perfusion.  NO ischemia or scar.  Images not gated.  Note RV appears prominent.  Appended Document: Cardiology Nuclear Testing please inform pt of results and [facility] Document: Cardiology Nuclear Testing sent to Dr BahnOmelia Blackwater

## 2010-10-07 NOTE — Procedures (Signed)
Summary: Holter and Event  Holter and Event   Imported By: Erle Crocker 01/06/2010 08:47:13  _____________________________________________________________________  External Attachment:    Type:   Image     Comment:   External Document

## 2010-10-07 NOTE — Assessment & Plan Note (Signed)
Summary: consult for possible osa   Copy to:  Dr. Jenell Milliner Primary Provider/Referring Provider:  Teresa Pelton  CC:  Sleep Consult.  Marland Kitchen  History of Present Illness: The pt is a 65y/o male who I have been asked to see for possible osa.  He has been noted to have loud snoring, pauses in his breathing during sleep, as well as gasping episodes according to his wife.  He typically goes to bed btw 11-12am, and arises at 9am to start his day.  He has frequent awakenings during the night, and describes nonrestorative sleep in the am's.  He does have sleep pressure with periods of inactivity during the day such as reading and watching tv.  He takes afternoon naps everday, but does not feel refreshed afterwards.  He has no issues with sleepiness while driving.  Of note, his weight is neutral over the past 2 years, and his epworth score is elevated at 12.  Current Medications (verified): 1)  Flomax 0.4 Mg Caps (Tamsulosin Hcl) .Marland Kitchen.. 1 Tab Two Times A Day 2)  Zoloft 50 Mg Tabs (Sertraline Hcl) .Marland Kitchen.. 1 Tab Once Daily 3)  Triazolam 0.25 Mg Tabs (Triazolam) .Marland Kitchen.. 1 Tab At Bedtime  Allergies (verified): No Known Drug Allergies  Past History:  Past Medical History: Reviewed history from 10/22/2009 and no changes required. CORONARY ARTERY DISEASE, PREMATURE, FAMILY HX (ICD-V17.3) PALPITATIONS (ICD-785.1) CONSTRICTIVE PERICARDITIS (ICD-423.2)  Past Surgical History: Reviewed history from 10/22/2009 and no changes required. Other than his heart, he had osteoarthritis with left  knee surgery in 2007,  hemorrhoidectomy in 2007, cholecystectomy in 1995,  right thumb surgery for osteoarthritis as well.   Family History: Reviewed history from 10/22/2009 and no changes required.  Positive for his father having a heart attack at age  28.   Social History: Reviewed history from 10/22/2009 and no changes required.  He does not smoke.   He does not drink.   He enjoys walking on a regular basis.   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   He is married.  2 children  Review of Systems       The patient complains of shortness of breath at rest, irregular heartbeats, anxiety, depression, and joint stiffness or pain.  The patient denies shortness of breath with activity, productive cough, non-productive cough, coughing up blood, chest pain, acid heartburn, indigestion, loss of appetite, weight change, abdominal pain, difficulty swallowing, sore throat, tooth/dental problems, headaches, nasal congestion/difficulty breathing through nose, sneezing, itching, ear ache, hand/feet swelling, rash, change in color of mucus, and fever.    Vital Signs:  Patient profile:   65 year old male Height:      67 inches Weight:      174.25 pounds BMI:     27.39 O2 Sat:      97 % on Room air Temp:     97.4 degrees F oral Pulse rate:   67 / minute BP sitting:   114 / 60  (left arm) Cuff size:   regular  Vitals Entered By: Raymondo Band RN (December 01, 2009 9:15 AM)  O2 Flow:  Room air CC: Sleep Consult.   Comments Medications reviewed with patient Daytime contact number verified with patient. Raymondo Band RN  December 01, 2009 9:16 AM    Physical Exam  General:  wd male in nad Eyes:  PERRLA and EOMI.   Nose:  mild septal deviation to left with mild narrowing Mouth:  moderate elongation of soft palate and uvula.  Mild soft tissue narrowing.   Neck:  no jvd, tmg, LN Lungs:  clear to auscultation Heart:  rrr, no mrg Abdomen:  soft and nontender, bs+ Extremities:  no edema, pulses intact distally no cyanosis Neurologic:  alert and oriented, moves all 4.   Impression & Recommendations:  Problem # 1:  OBSTRUCTIVE SLEEP APNEA (ICD-327.23) the pt's history is very suggestive of osa.  His wife notes loud snoring with pauses, and he has nonrestorative sleep.  He has definite abnormal sleepiness during day with inactivity.  He does not have the typical body habitus for osa, but does have a smallish  mandible that is recessed.  I think there is enough suspicion to justify proceeding with a sleep study.  I have also discussed with him the CV effects of untreated osa.  Other Orders: Consultation Level IV (22026) Sleep Disorder Referral (Sleep Disorder)  Patient Instructions: 1)  will set up for sleep study, and arrange f/u once results are available.

## 2010-10-07 NOTE — Progress Notes (Signed)
Summary: question re meds and appt/stress test  Phone Note Call from Patient Call back at [phone]   Caller: Patient Reason for Call: Talk to Nurse Summary of Call: pt [facility] called and stated they wanted the pt to see the dr on thursday and to get the stress done on thursday as well. pt has question re meds as well. Initial call taken by: Regan Lemming,  August 31, 2010 3:49 PM  Follow-up for Phone Call        will change  stress test  to 10/03/09 at 8:00am  Will let pt know tomorow Janan Halter, RN, BSN  August 31, 2010 5:40 PM Pt states he is scheduled for his ablation on 09/15/10 Janan Halter, RN, BSN  August 31, 2010 6:35 PM Spoke with Mariann Laster at [facility] the only thing that needs to be done on Thurs is his INR  We will keep his stress test on 09/09/10   Janan Halter, RN, BSN  September 01, 2010 10:12 AM  Additional Follow-up for Phone Call Additional follow up Details #1::        Need to set up PFT's w/ [facility] per Dr Omelia Blackwater  on same order as stress test Janan Halter, RN, BSN  September 01, 2010 10:17 AM

## 2010-10-07 NOTE — Assessment & Plan Note (Signed)
Summary: ROV AFIB ./CY PER DR Sarika Baldini   Visit Type:  rov Referring Provider:  Dr. Valera Castle Primary Provider:  Laurita Quint  CC:  pt states he felt like has been racing on and off..some dizzines/lightheadness...sob during heart racing...denies any edema.  History of Present Illness: Ernest Bell returns today for his palpitations and presyncope.  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  His life net monitor demonstrated paroxysmal atrial fibrillation of 270 beats per minute.  His happening about twice a week. Yesterday it happened all day. He has had to lay down to avoid getting lightheaded and dizzy.  He has no known drug allergies.  He was told by the surgeon at the time of his pericardiectomy that he had calcification that might cause problems in the future with his heart rhythm.  By CHADS2 criteria he is low risk for stroke.  Current Medications (verified): 1)  Flomax 0.4 Mg Caps (Tamsulosin Hcl) .Marland Kitchen.. 1 Tab Two Times A Day 2)  Zoloft 50 Mg Tabs (Sertraline Hcl) .Marland Kitchen.. 1 Tab Once Daily 3)  Triazolam 0.25 Mg Tabs (Triazolam) .Marland Kitchen.. 1 Tab At Bedtime  Allergies (verified): No Known Drug Allergies  Past History:  Past Surgical History: Last updated: 10/22/2009 Other than his heart, he had osteoarthritis with left  knee surgery in 2007,  hemorrhoidectomy in 2007, cholecystectomy in 1995,  right thumb surgery for osteoarthritis as well.   Review of Systems       negative other than history of present illness  Vital Signs:  Patient profile:   65 year old male Height:      67 inches Weight:      168 pounds Pulse rate:   62 / minute Pulse rhythm:   regular BP sitting:   126 / 76  (left arm) Cuff size:   large  Vitals Entered By: Danielle Rankin, CMA (December 09, 2009 3:26 PM)  Physical Exam  General:  Well developed, well nourished, in no acute distress. Head:  normocephalic and atraumatic Eyes:  PERRLA/EOM  intact; conjunctiva and lids normal. Chest Ernest Bell:  no deformities or breast masses noted Lungs:  Clear bilaterally to auscultation and percussion. Heart:  Non-displaced PMI, chest non-tender; regular rate and rhythm, S1, S2 without murmurs, rubs or gallops. Carotid upstroke normal, no bruit. Normal abdominal aortic size, no bruits. Femorals normal pulses, no bruits. Pedals normal pulses. No edema, no varicosities. Msk:  Back normal, normal gait. Muscle strength and tone normal. Pulses:  pulses normal in all 4 extremities Extremities:  No clubbing or cyanosis. Neurologic:  Alert and oriented x 3. Skin:  Intact without lesions or rashes. Psych:  Normal affect.   Problems:  Medical Problems Added: 1)  Dx of Atrial Fibrillation  (ICD-427.31)  Impression & Recommendations:  Problem # 1:  ATRIAL FIBRILLATION (ICD-427.31) Assessment New  He clearly has symptomatic paroxysmal atrial fibrillation. He has a low risk for thromboembolic stroke. Recent 2-D echocardiogram was fairly benign except for mild mitral regurgitation and a moderately dilated left atrium. He probably has some calcification scar tissue from his previous constrictive pericarditis and pericardiectomy.  I will start him on a rate slowing drug with diltiazem extended release 240 mg a day. Will start aspirin 325 mg per day. I'll arrange for him to receive a definitive consultation and treatment from Dr. Johney Frame of our electrophysiology division. All questions of his and his daughter answered.  His updated medication list for this problem includes:    Ecotrin  325 Mg Tbec (Aspirin) .Marland Kitchen... 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) Assessment: Improved  Patient Instructions: 1)  Your physician recommends that you schedule a follow-up appointment in: NEXT AVAILABLE WITH DR Johney Frame 2)  Your physician has recommended you make the following change in your medication: START DILTIAZEM ER 240 MG EVERY DAY 3)  ASPIRIN  325 MG EVERY DAY Prescriptions: DILTIAZEM HCL ER BEADS 240 MG XR24H-CAP (DILTIAZEM HCL ER BEADS) 1 once daily  #30 x 11   Entered by:   Scherrie Bateman, LPN   Authorized by:   Gaylord Shih, MD, [facility]   Signed by:   Scherrie Bateman, LPN on 16/06/9603   Method used:   Electronically to        [facility] [phone]* (retail)       [address] [place], [place]  [address]       Ph: [phone]       Fax: [phone]   RxID:   [id number]

## 2010-10-07 NOTE — Medication Information (Signed)
Summary: pending ablation/sp/ ok per tiffany myoview at 8:30/SL  Anticoagulant Therapy  Managed by: Freddrick March, RN, BSN Referring MD: Rayann Heman PCP: Teresa Pelton Supervising MD: Johnsie Cancel MD, Collier Salina Indication 1: Atrial Fibrillation Lab Used: [facility] [place] Site: [place] INR POC 1.4 INR RANGE 2.0-3.0  Dietary changes: no    Health status changes: no    Bleeding/hemorrhagic complications: no    Recent/future hospitalizations: no    Any changes in medication regimen? yes       Details: Changing from Pradaxa to Coumadin.    Recent/future dental: no  Any missed doses?: no       Is patient compliant with meds? yes      Comments: Last dose of Pradaxa 3 days ago, started on Coumadin 2 days ago 16m daily.    Allergies: No Known Drug Allergies  Anticoagulation Management History:      The patient comes in today for his initial visit for anticoagulation therapy.  Negative risk factors for bleeding include an age less than 617years old.  The bleeding index is 'low risk'.  Negative CHADS2 values include Age > 738years old.  His last INR was 0.8 ratio.  Anticoagulation responsible provider: NJohnsie CancelMD, PCollier Salina  INR POC: 1.4.  Cuvette Lot#: [id number]  Exp: 10/2011.    Anticoagulation Management Assessment/Plan:      The patient's current anticoagulation dose is Warfarin sodium 5 mg tabs: Use as directed by Anticoagulation Clinic.  The target INR is 2.0-3.0.  The next INR is due 09/08/2010.  Results were reviewed/authorized by EFreddrick March RN, BSN.  He was notified by EFreddrick MarchRN.         Current Anticoagulation Instructions: INR 1.4  Start taking 512mdaily except 2.75m68mn Saturday and Sunday.  Recheck on Wednesday.

## 2010-10-07 NOTE — Letter (Signed)
Summary: Appointment - Reminder 2  [facility], Main Office  [address]   [place], [place] [address]   Phone: [phone]  Fax: [phone]     April 16, 2009 MRN: [id number]   Ernest Bell [address] [place], [place]  [address]   Dear [name],  Our records indicate that it is time to schedule a follow-up appointment.  Dr.WALL recommended that you follow up with [place] in OCTOBER 2010. It is very important that we reach you to schedule this appointment. We look forward to participating in your health care needs. Please contact us at the number listed above at your earliest convenience to schedule your appointment.  If you are unable to make an appointment at this time, give [place] a call so we can update our records.     Sincerely, GESILA,DAVIS  Glass blower/designer

## 2010-10-07 NOTE — Letter (Signed)
Summary: ELectrophysiology/Ablation Procedure Instructions  [facility] [place]  [address] [place]   [place], [place] [address]   Phone: [phone]  Fax: [phone]     Electrophysiology/Ablation Procedure Instructions    You are scheduled for a(n) ablation on 06/17/10 at 7:30am with Dr. Rayann Heman.  1.  Please come to the [place] [facility] at 5:30am on the day of your procedure.  2.  Come prepared to stay overnight.   Please bring your insurance cards and a list of your medications.  3.  Come to the [place] office on 06/10/10  for lab work.  .  You do not have to be fasting.  4.  Do not have anything to eat or drink after midnight the night before your procedure.  5.  Do NOT take these medications for the morning of your procedure unless otherwise instructed: Flomax,Diltiazem,Metoprolol.  All of your remaining medications may be taken with a small amount of water.  6.  Educational material received:   Ablation   * Occasionally, EP studies and ablations can become lengthy.  Please make your family aware of this before your procedure starts.  Average time ranges from 2-8 hours for EP studies/ablations.  Your physician will locate your family after the procedure with the results.  * If you have any questions after you get home, please call the office at [phone]. Leonia Reader  TEE--06/16/10 with Dr Johnsie Cancel  on 06/16/10 at 11:00am  Check in at Short Stay at [facility] at 10:00am  Nothing to eat or drink after midnight the night prior to the procedure  You must have someone drive you home

## 2010-10-07 NOTE — Progress Notes (Signed)
Summary: need to sched ov with KC [id number]  ---- Converted from flag ---- ---- 01/04/2010 9:17 AM, Arman Filter LPN wrote: pt needs ov with kc to discuss sleep study results. ------------------------------  [id number].   Aundra Millet Calyn Rubi LPN  Jan 04, 453 9:26 AM   [facility].  Aundra Millet Tiari Andringa LPN  Jan 06, 980 3:37 PM   pt scheduled to see [facility] 01-07-2010 at 9:15am.  Arman Filter LPN  Jan 06, 1913 1:45 PM

## 2010-10-07 NOTE — Letter (Signed)
Summary: Nadara Eaton letter  [place] [facility]  [address] [place], [place] [address]   Phone: [phone]  Fax: [phone]       04/12/2010 MRN: [id number]  BASHEER MOLCHAN [address] [place], [place]  [address]  Dear Mr. LAURIE,  [facility], and [facility] announce the retirement of Arta Silence, M.D., from full-time practice at the [facility] office effective March 04, 2010 and his plans of returning part-time.  It is important to Dr. Hetty Ely and to our practice that you understand that [facility] has seven physicians in our office for your health care needs.  We will continue to offer the same exceptional care that you have today.    Dr. Hetty Ely has spoken to many of you about his plans for retirement and returning part-time in the fall.   We will continue to work with you through the transition to schedule appointments for you in the office and meet the high standards that [place] is committed to.   Again, it is with great pleasure that we share the news that Dr. Hetty Ely will return to [facility] in October of 2011 with a reduced schedule.    If you have any questions, or would like to request an appointment with one of our physicians, please call us at [phone] and press the option for Scheduling an appointment.  We take pleasure in providing you with excellent patient care and look forward to seeing you at your next office visit.  Our [facility] Physicians are:  Tillman Abide, M.D. Laurita Quint, M.D. Roxy Manns, M.D. Kerby Nora, M.D. Hannah Beat, M.D. Ruthe Mannan, M.D. We proudly welcomed Raechel Ache, M.D. and Eustaquio Boyden, M.D. to the practice in July/August 2011.  Sincerely,  [facility]

## 2010-10-07 NOTE — Assessment & Plan Note (Signed)
Summary: 8 [email or web address]   Visit Type:  Follow-up Referring Provider:  Dr. Jenell Milliner Primary Provider:  Teresa Pelton   History of Present Illness: The patient presents today for routine electrophysiology followup. He reports doing very well since his afib ablation at [facility] 09/15/10.  He denies procedure related complications.  He is maintaining sinus at this point.  The patient denies symptoms of palpitations, chest pain, shortness of breath, orthopnea, PND, lower extremity edema, dizziness, presyncope, syncope, or neurologic sequela. The patient is tolerating medications without difficulties and is otherwise without complaint today.   Current Medications (verified): 1)  Flomax 0.4 Mg Caps (Tamsulosin Hcl) .Marland Kitchen.. 1 Tab Two Times A Day 2)  Zoloft 100 Mg Tabs (Sertraline Hcl) .... Take One By Mouth Daily 3)  Metoprolol Tartrate 25 Mg Tabs (Metoprolol Tartrate) .... Take One Tablet By Mouth Twice Times Daily 4)  Warfarin Sodium 5 Mg Tabs (Warfarin Sodium) .... Use As Directed By Anticoagulation Clinic 5)  Diltiazem Hcl 30 Mg Tabs (Diltiazem Hcl) .... Take Only If Hr >100 6)  Flecainide Acetate 50 Mg Tabs (Flecainide Acetate) .... Take 1 Tab Two Times A Day 7)  Prilosec 20 Mg Cpdr (Omeprazole) .... Take 1 Two Times A Day For 30 Days.  Allergies (verified): 1)  Penicillin  Past History:  Past Medical History: Persistent atrial fibrillation s/p PVI (Cryo) at [facility] 09/15/10 typical appearing atrial flutter dx 04/28/10 s/p CTI ablation 10/11 CONSTRICTIVE PERICARDITIS (unknown cause) s/p pericardectomy 1969 Depression DJD BPH ?liver cirrhosis diagnosed at time of cholecystectomy recurrent syncope s/p Tilt study by Dr [name] 1997 (likely due to dysautonomia) Gout  Past Surgical History: [facility]ardioectomy [facility] at [facility] he had osteoarthritis with left  knee surgery in 2007,  hemorrhoidectomy in 2007, cholecystectomy in 1995,  right thumb surgery for osteoarthritis afib  ablation (Cryo) at [facility] 09/15/10 CTI ablatioin at [facility] 04/28/10  Social History: Reviewed history from 12/30/2009 and no changes required. Pt lives in [place] with spouse.  He does not smoke.   He does not drink.   He enjoys walking on a regular basis.   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    Vital Signs:  Patient profile:   65 year old male Height:      67 inches Weight:      174 pounds Pulse rate:   68 / minute BP sitting:   110 / 78  (left arm)  Vitals Entered By: Talbert Nan, CMA (September 22, 2010 2:41 PM)  Physical Exam  General:  Well developed, well nourished, in no acute distress. Head:  normocephalic and atraumatic Eyes:  PERRLA/EOM intact; conjunctiva and lids normal. Mouth:  Teeth, gums and palate normal. Oral mucosa normal. Neck:  supple Lungs:  Clear bilaterally to auscultation and percussion. Heart:  RRR, no m/r/g Abdomen:  Bowel sounds positive; abdomen soft and non-tender without masses, organomegaly, or hernias noted. No hepatosplenomegaly. Msk:  Back normal, normal gait. Muscle strength and tone normal. Extremities:  No clubbing or cyanosis. mild ecchymosis L groin, no bruits Neurologic:  Alert and oriented x 3. CNII-XII intact, strength/sensation are intact Skin:  Intact without lesions or rashes.  small L groin ecchymosis Psych:  Normal affect.   EKG  Procedure date:  09/22/2010  Findings:      sinus bradycardia 59 bpm, PR 180, QRS 76, Qtc 447, nonspecific ST/T changes  Impression & Recommendations:  Problem # 1:  ATRIAL FIBRILLATION (ICD-427.31) doing well s/p ablation unfortunately  his INR has become quite labile.  His INR yesterday was 1.1  This is quite concerning only 1 week post ablation.  I will therefore stop coumadin today and restart pradaxa 124m two times a day. He will follow-up as scheduled with Dr BOmelia Blackwater  I will see him again in 3 months.  Patient Instructions: 1)  Your physician  wants you to follow-up in: 3 months with  DrAllred  You will receive a reminder letter in the mail two months in advance. If you don't receive a letter, please call our office to schedule the follow-up appointment. 2)  Your physician has recommended you make the following change in your medication: stop Coumadin and restart Pradaxa 1559mtwo times a day Prescriptions: PRADAXA 150 MG CAPS (DABIGATRAN ETEXILATE MESYLATE) one by mouth bid  #60 x 3   Entered by:   KeJanan HalterRN, BSN   Authorized by:   JaThompson GrayerMD   Signed by:   KeJanan HalterRN, BSN on 09/22/2010   Method used:   Electronically [facility]        [facility]       [address]  [id number]     Ph: [phone]     Fax: [phone] RxID:   [id number]

## 2010-10-07 NOTE — Miscellaneous (Signed)
  Clinical Lists Changes  Medications: Removed medication of DILTIAZEM HCL ER BEADS 240 MG XR24H-CAP (DILTIAZEM HCL ER BEADS) 1 by mouth two times a day Added new medication of DILTIAZEM HCL 30 MG TABS (DILTIAZEM HCL) Take only if HR >100 Added new medication of FLECAINIDE ACETATE 50 MG TABS (FLECAINIDE ACETATE) Take 1 tab two times a day Added new medication of PRILOSEC 20 MG CPDR (OMEPRAZOLE) Take 1 two times a day for 30 days.

## 2010-10-07 NOTE — Progress Notes (Signed)
  Phone Note Call from Patient   Summary of Call: pt's wife called today...since recent D/C'ing of her husband's flecainide, his HR has increased to as high as 130 range. pt otherwise asymptomatic...in fact, he went hunting earlier this AM.  I advised her to increase his Lopressor to 25 mg three times a day, starting this afternoon. she is to call me tomorrow, if his HR is persistently elevated, or he becomes symptomatic. Initial call taken by: Nelida Meuse, PA-C,  August 07, 2010 3:19 PM

## 2010-10-07 NOTE — Assessment & Plan Note (Signed)
Summary: 2wk f/u ok per kelly to wait until 05-19-10 use np slot/sl   Visit Type:  2 wk f/u Referring Provider:  Dr. Jenell Milliner Primary Provider:  Teresa Pelton  CC:  afib.  History of Present Illness: Ernest Bell continues to have symptomatic atrial arrhythmias.  He reports SOB, fatigue, and decreased exercise tolerance.  This is much worse than when I saw him several weeks ago.  He feels that he is "out of rhythm" most of the time.  He denies CP, presyncope, or syncope.  Current Medications (verified): 1)  Flomax 0.4 Mg Caps (Tamsulosin Hcl) .Marland Kitchen.. 1 Tab Two Times A Day 2)  Bayer Aspirin 325 Mg Tabs (Aspirin) .... Take 1 Tablet By Mouth Once A Day 3)  Diltiazem Hcl Er Beads 240 Mg Xr24h-Cap (Diltiazem Hcl Er Beads) .Marland Kitchen.. 1 By Mouth Two Times A Day 4)  Flecainide Acetate 100 Mg Tabs (Flecainide Acetate) .... One By Mouth Two Times A Day 5)  Requip 0.5 Mg  Tabs (Ropinirole Hcl) .... As Directed 6)  Zoloft 100 Mg Tabs (Sertraline Hcl) .... Take One By Mouth Daily  Allergies (verified): No Known Drug Allergies  Past History:  Past Medical History: Persistent atrial fibrillation typical appearing atrial flutter dx 04/28/10 + rheumatic fever as a child CONSTRICTIVE PERICARDITIS (unknown cause) s/p pericardectomy 1969 Depression DJD BPH ?liver cirrhosis diagnosed at time of cholecystectomy recurrent syncope s/p Tilt study by Dr [name] 1997 (likely due to dysautonomia) Gout  Past Surgical History: Reviewed history from 12/30/2009 and no changes required. Pericardioectomy 1969 at [facility] he had osteoarthritis with left  knee surgery in 2007,  hemorrhoidectomy in 2007, cholecystectomy in 1995,  right thumb surgery for osteoarthritis  Family History: Reviewed history from 10/22/2009 and no changes required.  Positive for his father having a heart attack at age  36.   Social History: Reviewed history from 12/30/2009 and no changes required. Pt lives in [place]  with spouse.  He does not smoke.   He does not drink.   He enjoys walking on a regular basis.   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    Review of Systems       All systems are reviewed and negative except as listed in the HPI.   Vital Signs:  Patient profile:   65 year old male Height:      67 inches Weight:      177 pounds BMI:     27.82 Pulse rate:   144 / minute Pulse rhythm:   irregular BP sitting:   112 / 72  (left arm) Cuff size:   large  Vitals Entered By: Julaine Hua, CMA (May 19, 2010 11:49 AM)  Physical Exam  General:  Well developed, well nourished, in no acute distress. Head:  normocephalic and atraumatic Eyes:  PERRLA/EOM intact; conjunctiva and lids normal. Mouth:  Teeth, gums and palate normal. Oral mucosa normal. Neck:  supple, JVP 8cm Lungs:  Clear bilaterally to auscultation and percussion. Heart:  iRRR, no m/r/g Abdomen:  Bowel sounds positive; abdomen soft and non-tender without masses, organomegaly, or hernias noted. No hepatosplenomegaly. Msk:  Back normal, normal gait. Muscle strength and tone normal. Pulses:  pulses normal in all 4 extremities Extremities:  No clubbing or cyanosis. Neurologic:  Alert and oriented x 3. Skin:  Intact without lesions or rashes. Cervical Nodes:  no significant adenopathy Psych:  Normal affect.   EKG  Procedure date:  05/19/2010  Findings:  afib,  V rate 140s the atrial rhythm at times appears organized, but is likely afib and not atrial flutter today  Impression & Recommendations:  Problem # 1:  ATRIAL FIBRILLATION (ICD-427.31) The patient has symptomatic persistent afib and typical appearing atrial flutter.  He  has failed medical therapy with flecainide and cardizem.  He has a h/o rheumatic fever as a child,and moderate LA enlargement.  Therapeutic strategies for afib and atrial flutter including medicine and ablation were discussed in detail with the patient today.  Risk, benefits, and alternatives to EP study and radiofrequency ablation were also discussed in detail today. These risks include but are not limited to stroke, bleeding, vascular damage, tamponade, perforation, damage to the esophagus, lungs, and other structures, pulmonary vein stenosis, worsening renal function, and death. The patient understands these risk and wishes to consider ablation.  We will initiate pradaxa 13m  two times a day today.  Once his INR has been therapeutic for 4 weeks, we will proceed with ablation. I have added metoprolol today for rhythm control and will have the patient return for follow-up in 2 weeks.  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  Catheter ablation is a medical procedure used to treat some cardiac arrhythmias (irregular heartbeats). During catheter ablation, a long, thin, flexible tube is put into a blood vessel in your groin (upper thigh), or neck. This tube is called an ablation catheter. It is then guided to your heart through the blood vessel. Radiofrequency waves destroy small areas of heart tissue where abnormal heartbeats may cause an arrhythmia to start.  Please see the instruction sheet given to you today. Prescriptions: METOPROLOL TARTRATE 25 MG TABS (METOPROLOL TARTRATE) Take one tablet by mouth twice a day  #60 x 3   Entered by:   KelJanan HalterN, BSN   Authorized by:   JamThompson GrayerD   Signed by:   KelJanan HalterN, BSN on 05/19/2010   Method used:    [facility]        [id number]       [address]  [id number]    Ph: [phone]    Fax: [phone]:  :   [id number] 150 MG CAPS (DABIGATRAN ETEXILATE MESYLATE) one by mouth two times a day  #60 x 3   Entered by:   KelJanan HalterN, BSN   Authorized by:   JamThompson GrayerD   Signed by:   KelJanan HalterN, BSN on 05/19/2010   Method used:   [facility]ronically [facility]        [facility]       [address]    [place]  [id number]    Ph: [phone]  Fax: [phone]   RxID:   [id number]

## 2010-10-07 NOTE — Assessment & Plan Note (Signed)
Summary: 2wk f/u/sl   Visit Type:  Follow-up Referring Provider:  Dr. Jenell Milliner Primary Provider:  Teresa Pelton   History of Present Illness: Ernest Bell is a pleasant 65 yo WM with a h/o prior surgery for constrictive pericarditis and paroxysmal atrial fibrillation who presents today for Ep follow-up of his afib and recently discovered atrial flutter.  He reports initially having atrial fibrillation in the late 1980s.  He was placed on coumadin and quinidine after evaluation at [facility].  He did well, without afib for several years.  He then stopped quinidine in the early 1990s.  He reports having increasing frequency and duration of afib.  He feels that his afib has been most noticable over the past 6 months.  He is unaware of triggers or precipitants for his afib.  He does not drink ETOH.  He has been recently tested for sleep apnea.   He has recently failed medical therapy with flecainide. During his afib, he reports symptoms of nervousness, SOB, palpitations, dizziness, and fatigue.  Episodes typically last several hours.  The patient denies symptoms of chest pain, orthopnea, PND, lower extremity edema,  presyncope, syncope, or neurologic sequela. The patient is tolerating medications without difficulties and is otherwise without complaint today.   Current Medications (verified): 1)  Flomax 0.4 Mg Caps (Tamsulosin Hcl) .Marland Kitchen.. 1 Tab Two Times A Day 2)  Diltiazem Hcl Er Beads 240 Mg Xr24h-Cap (Diltiazem Hcl Er Beads) .Marland Kitchen.. 1 By Mouth Two Times A Day 3)  Flecainide Acetate 100 Mg Tabs (Flecainide Acetate) .... One By Mouth Two Times A Day 4)  Requip 0.5 Mg  Tabs (Ropinirole Hcl) .... As Directed 5)  Zoloft 100 Mg Tabs (Sertraline Hcl) .... Take One By Mouth Daily 6)  Pradaxa 150 Mg Caps (Dabigatran Etexilate Mesylate) .... One By Mouth Two Times A Day 7)  Metoprolol Tartrate 25 Mg Tabs (Metoprolol Tartrate) .... Take One Tablet By Mouth Twice A Day  Allergies (verified): No Known Drug  Allergies  Past History:  Past Medical History: Reviewed history from 05/19/2010 and no changes required. Persistent atrial fibrillation typical appearing atrial flutter dx 04/28/10 + rheumatic fever as a child CONSTRICTIVE PERICARDITIS (unknown cause) s/p pericardectomy 1969 Depression DJD BPH ?liver cirrhosis diagnosed at time of cholecystectomy recurrent syncope s/p Tilt study by Dr [name] 1997 (likely due to dysautonomia) Gout  Past Surgical History: Reviewed history from 12/30/2009 and no changes required. Pericardioectomy 1969 at [facility] he had osteoarthritis with left  knee surgery in 2007,  hemorrhoidectomy in 2007, cholecystectomy in 1995,  right thumb surgery for osteoarthritis  Social History: Reviewed history from 12/30/2009 and no changes required. Pt lives in [place] with spouse.  He does not smoke.   He does not drink.   He enjoys walking on a regular basis.   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    Review of Systems       All systems are reviewed and negative except as listed in the HPI.   Vital Signs:  Patient profile:   65 year old male Height:      67 inches Weight:      177 pounds BMI:     27.82 Pulse rate:   80 / minute BP sitting:   90 / 70  (right arm)  Vitals Entered By: Margaretmary Bayley CMA (June 04, 2010 10:54 AM)  Physical Exam  General:  Well developed, well nourished, in no acute distress. Head:  normocephalic  and atraumatic Eyes:  PERRLA/EOM intact; conjunctiva and lids normal. Mouth:  Teeth, gums and palate normal. Oral mucosa normal. Neck:  supple, JVP 8cm Lungs:  Clear bilaterally to auscultation and percussion. Heart:  iRRR, no m/r/g Abdomen:  Bowel sounds positive; abdomen soft and non-tender without masses, organomegaly, or hernias noted. No hepatosplenomegaly. Msk:  Back normal, normal gait. Muscle strength and tone normal. Pulses:  pulses normal in all 4  extremities Extremities:  No clubbing or cyanosis. Neurologic:  Alert and oriented x 3. Skin:  Intact without lesions or rashes. Cervical Nodes:  no significant adenopathy Psych:  Normal affect.   Echocardiogram  Procedure date:  12/01/2009  Findings:       Study Conclusions    - Left ventricle: The cavity size was normal. Wall thickness was     normal. Systolic function was normal. The estimated ejection     fraction was 60%. Wall motion was normal; there were no regional     wall motion abnormalities. Features are consistent with a     pseudonormal left ventricular filling pattern, with concomitant     abnormal relaxation and increased filling pressure (grade 2     diastolic dysfunction).   - Aortic valve: There was no stenosis.   - Mitral valve: Mild regurgitation.   - Left atrium: The atrium was moderately dilated. LA volume 75 mL.   - Pulmonary arteries: PA peak pressure: 21m Hg (S).   - Inferior vena cava: The vessel was normal in size; the     respirophasic diameter changes were in the normal range (= 50%);     findings are consistent with normal central venous pressure.   Impressions:    - Normal LV size and systolic function, EF 677% Moderate diastolic     dysfunction. Moderate left atrial enlargement by atrial volume.     There is no interventricular interdependence noted and the IVC is     not dilated, which make constrictive pericarditis less likely. The     RV appears normal in size and systolic function. No pulmonary     hypertension.   Transthoracic echocardiography. M-mode, complete 2D, spectral   Doppler, and color Doppler. Height: Height: 170.2cm. Height: 67in.   Weight: Weight: 77.6kg. Weight: 170.6lb. Body mass index: BMI:   26.8kg/m^2. Body surface area: BSA: 1.856m. Blood pressure: 130/80.   Patient status: Outpatient. Location: [facility]     Signed by CaJulaine HuaCMA on 12/09/2009 at 8:32  AM  ________________________________________________________________________     Sleep Study  Procedure date:  12/21/2009  Findings:       OXYGEN DATA:  There was O2 desaturation as low as 84% transiently with   the patient's obstructive events.      CARDIAC DATA:  Rare PVC noted, but no clinically significant arrhythmia   seen.      MOVEMENT-PARASOMNIA:  The patient was found to have large numbers of leg   jerks with only a rare arousal or awakening.  There were no obvious   behavioral abnormalities.      IMPRESSIONS-RECOMMENDATIONS:   1. Mild obstructive sleep apnea/hypopnea syndrome with an apnea-       hypopnea index of 14 events per hour and oxygen desaturation as low       as 84%.  Treatment for this degree of sleep apnea can include a       trial of weight loss alone, upper airway surgery, dental appliance,       and also continuous  positive airway pressure.  Clinical       correlation is suggested.   2. Rare premature ventricular contraction noted, but no clinically       significant arrhythmia seen.               Kathee Delton, MD,FCCP    Impression & Recommendations:  Problem # 1:  ATRIAL FIBRILLATION (ICD-427.31) The patient has symptomatic persistent afib and typical appearing atrial flutter.  He  has failed medical therapy with flecainide, quinidine, and cardizem.  He has a h/o rheumatic fever as a child,and moderate LA enlargement.  Therapeutic strategies for afib and atrial flutter including medicine and ablation were discussed in detail with the patient and his spouse today. Risk, benefits, and alternatives to EP study and radiofrequency ablation were also discussed in detail today. These risks include but are not limited to stroke, bleeding, vascular damage, tamponade, perforation, damage to the esophagus, lungs, and other structures, pulmonary vein stenosis, worsening renal function, and death. The patient understands these risk and wishes to proceed with  ablation.  We will proceed to ablation at the next available time.  Problem # 2:  OBSTRUCTIVE SLEEP APNEA (ICD-327.23)  stable  Problem # 3:  CONSTRICTIVE PERICARDITIS (ICD-423.2)  remote stable

## 2010-10-07 NOTE — Letter (Signed)
Summary: Referral letter/[facility]  Referral letter/[facility]   Imported By: Lester Hermiston 01/26/2009 07:53:23  _____________________________________________________________________  External Attachment:    Type:   Image     Comment:   External Document

## 2010-10-07 NOTE — Assessment & Plan Note (Signed)
Summary: f2y   Visit Type:  2 yr f/u Primary Provider:  Laurita Quint  CC:  chest discomfort/sob...denies any edema.  History of Present Illness: Ernest Bell comes in today for increased palpitations. These occur spontaneously, feel like his heart skips a beat that has a heartbeat, and associated with some dull chest discomfort. They can last up to half an hour.  They're not related to exertion. He has had some dizzy spells but no syncope.  Notably, he had a stress echocardiogram in November of 2009. This was unremarkable with good exercise tolerance, EF of 65%, no evidence of ischemia.  He is not a caffeine user. He exercises on a regular basis. He uses no other stimulants. He is not a smoker.  He has not had recent blood work.  His wife describes gurgling in his sleep and he stops breathing. In going on for a long time "" It has not been formally evaluated.  Current Medications (verified): 1)  Flomax 0.4 Mg Caps (Tamsulosin Hcl) .Marland Kitchen.. 1 Tab Two Times A Day 2)  Zoloft 50 Mg Tabs (Sertraline Hcl) .Marland Kitchen.. 1 Tab Once Daily 3)  Wellbutrin 75 Mg Tabs (Bupropion Hcl) .Marland Kitchen.. 1 Tab Once Daily 4)  Triazolam 0.25 Mg Tabs (Triazolam) .Marland Kitchen.. 1 Tab At Bedtime  Allergies (verified): No Known Drug Allergies  Past History:  Past Medical History: Last updated: 10/22/2009 CORONARY ARTERY DISEASE, PREMATURE, FAMILY HX (ICD-V17.3) PALPITATIONS (ICD-785.1) CONSTRICTIVE PERICARDITIS (ICD-423.2)  Past Surgical History: Last updated: 10/22/2009 Other than his heart, he had osteoarthritis with left  knee surgery in 2007,  hemorrhoidectomy in 2007, cholecystectomy in 1995,  right thumb surgery for osteoarthritis as well.   Family History: Last updated: 10/22/2009  Positive for his father having a heart attack at age  7.   Social History: Last updated: 10/22/2009  He does not smoke.   He does not drink.   He enjoys walking on a regular basis.   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   He  is married.   Review of Systems       negative other than history of present illness  Vital Signs:  Patient profile:   65 year old male Height:      67 inches Weight:      171 pounds BMI:     26.88 Pulse rate:   71 / minute Pulse rhythm:   irregular BP sitting:   122 / 80  (left arm) Cuff size:   large  Vitals Entered By: Danielle Rankin, CMA (November 10, 2009 4:20 PM)  Physical Exam  General:  Well developed, well nourished, in no acute distress. Head:  normocephalic and atraumatic Eyes:  PERRLA/EOM intact; conjunctiva and lids normal. Neck:  Neck supple, no JVD. No masses, thyromegaly or abnormal cervical nodes. Chest Shamia Uppal:  stabile median sternotomy Lungs:  Clear bilaterally to auscultation and percussion. Heart:  nondisplaced PMI, regular rate and rhythm, normal S1-S2 Abdomen:  Bowel sounds positive; abdomen soft and non-tender without masses, organomegaly, or hernias noted. No hepatosplenomegaly. Msk:  Back normal, normal gait. Muscle strength and tone normal. Pulses:  pulses normal in all 4 extremities Extremities:  No clubbing or cyanosis. Neurologic:  Alert and oriented x 3. Skin:  Intact without lesions or rashes. Psych:  Normal affect.   EKG  Procedure date:  11/10/2009  Findings:      normal sinus rhythm, normal EKG  Impression & Recommendations:  Problem # 1:  PALPITATIONS (ICD-785.1) Assessment Deteriorated  His palpitations have  increased. They sound like premature beats but would need to rule out any Atrial arrhythmia. We will obtain an event recorder. We will obtain a set of electrolytes, magnesium, and TSH. Also obtain a 2-D echocardiogram to followup on LV  systolic function and any evidence of diastolic dysfunction with his history of constrictive pericarditis  Orders: EKG w/ Interpretation (93000) Echocardiogram (Echo) Event (Event)  Problem # 2:  CORONARY ARTERY DISEASE, PREMATURE, FAMILY HX (ICD-V17.3) Assessment: Unchanged  Problem # 3:   CONSTRICTIVE PERICARDITIS (ICD-423.2) Assessment: Improved  Problem # 4:  OBSTRUCTIVE SLEEP APNEA (ICD-327.23) Assessment: New  Orders: Pulmonary Referral (Pulmonary)  Patient Instructions: 1)  Your physician recommends that you schedule a follow-up appointment in: YEAR WITH DR Norton Bivins 2)  Your physician recommends that you return for lab work OZ:HYQM MAG TSH 785.1 DAY OF ECHO AND MONITOR 3)  Your physician recommends that you continue on your current medications as directed. Please refer to the Current Medication list given to you today. 4)  Your physician has requested that you have an echocardiogram.  Echocardiography is a painless test that uses sound waves to create images of your heart. It provides your doctor with information about the size and shape of your heart and how well your heart's chambers and valves are working.  This procedure takes approximately one hour. There are no restrictions for this procedure. 5)  Your physician has recommended that you wear an event monitor.  Event monitors are medical devices that record the heart's electrical activity. Doctors most often use these monitors to diagnose arrhythmias. Arrhythmias are problems with the speed or rhythm of the heartbeat. The monitor is a small, portable device. You can wear one while you do your normal daily activities. This is usually used to diagnose what is causing palpitations/syncope (passing out). 6)  You have been referred toPULMONARY TO R/O SLEEP APNEA

## 2010-10-07 NOTE — Letter (Signed)
Summary: [facility] New Patient Report   [facility] Elect[facility]iology New Patient Report   Imported By: Sallee Provencal 09/07/2010 12:22:53  _____________________________________________________________________  External Attachment:    Type:   Image     Comment:   External Document

## 2010-10-07 NOTE — Assessment & Plan Note (Signed)
Summary: sinus infection/alc   Vital Signs:  Patient profile:   65 year old male Weight:      176.25 pounds Temp:     98.2 degrees F oral Pulse rate:   64 / minute Pulse rhythm:   regular BP sitting:   100 / 60  (left arm) Cuff size:   large  Vitals Entered By: Sydell Axon LPN (August 19, 2010 2:47 PM) CC: ? Sinus infection, head is stopped up at night, cough and sinus drainage   History of Present Illness: Pt here for sinus problems. He is constantly clearing his throat and can't breathe through nose at night. He has tried something over the counter.  He has been to [facility] He does not sneeze much and has no itchy eyes. He also has night sweats, was told at [facility] to ask his  PCD. He denies fever or chills, headache, ear pain, ST. He does have runny nose and cough, principally at night, productive of white to clear sputum. He has not been seen here in at least 3 years except for congestion in May.Marland Kitchen He recently had Ablation which did not work and will be repeated.  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 1 Tab Two Times A Day 2)  Diltiazem Hcl Er Beads 240 Mg Xr24h-Cap (Diltiazem Hcl Er Beads) .Marland Kitchen.. 1 By Mouth Two Times A Day 3)  Flecainide Acetate 100 Mg Tabs (Flecainide Acetate) .... One By Mouth Two Times A Day 4)  Requip 0.5 Mg  Tabs (Ropinirole Hcl) .... As Directed 5)  Zoloft 100 Mg Tabs (Sertraline Hcl) .... Take One By Mouth Daily 6)  Pradaxa 150 Mg Caps (Dabigatran Etexilate Mesylate) .... One By Mouth Two Times A Day 7)  Metoprolol Tartrate 25 Mg Tabs (Metoprolol Tartrate) .... Take One Tablet By Mouth Three Times Daily 8)  Ambien 5 Mg  Tabs (Zolpidem Tartrate) .... One By Mouth At Bedtime  Current Medications (verified): 1)  Flomax 0.4 Mg Caps (Tamsulosin Hcl) .Marland Kitchen.. 1 Tab Two Times A Day 2)  Diltiazem Hcl Er Beads 240 Mg Xr24h-Cap (Diltiazem Hcl Er Beads) .Marland Kitchen.. 1 By Mouth Two Times A Day 3)  Requip 0.5 Mg  Tabs (Ropinirole Hcl) .... As Directed 4)  Zoloft 100 Mg Tabs (Sertraline Hcl) .... Take One By Mouth Daily 5)  Pradaxa 150 Mg Caps (Dabigatran Etexilate Mesylate) .... One By Mouth Two Times A Day 6)  Metoprolol Tartrate 25 Mg Tabs (Metoprolol Tartrate) .... Take One Tablet By Mouth Three Times Daily 7)  Ambien 5 Mg Tabs (Zolpidem Tartrate) .... One By Mouth At Bedtime  Allergies: No Known Drug Allergies  Physical Exam  General:  Well-developed,well-nourished,in no acute distress; alert,appropriate and cooperative throughout examination, does not seem  congested. Head:  Normocephalic and atraumatic without obvious abnormalities. No apparent alopecia but mild  balding. Sinuses NT. Eyes:  Conjunctiva clear bilaterally.  Ears:  External ear exam shows no significant lesions or deformities.  Otoscopic examination reveals clear canals, tympanic membranes are intact bilaterally without bulging, retraction, inflammation or discharge. Hearing is grossly normal bilaterally. Nose:  External nasal examination shows no deformity or inflammation. Nasal mucosa are pink and moist without lesions  or exudates. Mouth:  Oral mucosa and oropharynx without lesions or exudates.  Teeth in good repair. Neck:  Neck supple, no JVD. No masses, thyromegaly or abnormal cervical nodes. Lungs:  Clear bilaterally to auscultation and percussion. Heart:  Non-displaced PMI, chest non-tender; regular rate and rhythm, S1, S2 without murmurs, rubs or gallops. Carotid upstroke normal, no bruit. Normal abdominal aortic size, no bruits. Femorals normal pulses, no bruits. Pedals normal pulses. No edema, no varicosities.   Impression &  Recommendations:  Problem # 1:  SINUSITIS - ACUTE-NOS (ICD-461.9) Assessment New Sounds like chronic inflammation. No S/S of bacterial infection. See instructions. His tonsils are larger than average for age (most 65 year old tonsils are atrophied to nothing) but are not big enough to cause sxs in my opinion.  Problem # 2:  NIGHT SWEATS (ICD-780.8) Assessment: New Will check CBC and testosterone level   Problem # 3:  RESTLESS LEGS SYNDROME (ICD-333.94) Assessment: Unchanged On Requip. Continue.  Problem # 4:  ATRIAL FIBRILLATION (ICD-427.31) Assessment: Unchanged  To have repeat Ablation. The following medications were removed from the medication list:    Flecainide Acetate 100 Mg Tabs (Flecainide acetate) ..... One by mouth two times a day His updated medication list for this problem includes:    Diltiazem Hcl Er Beads 240 Mg Xr24h-cap (Diltiazem hcl er beads) .Marland Kitchen... 1 by mouth two times a day    Metoprolol Tartrate 25 Mg Tabs (Metoprolol tartrate) .Marland Kitchen... Take one tablet by mouth three times daily  Reviewed the following: PT: 8.5 (06/10/2010)   INR: 0.8 ratio (06/10/2010)  Complete Medication List: 1)  Flomax 0.4 Mg Caps (Tamsulosin hcl) .Marland Kitchen.. 1 tab two times a day 2)  Diltiazem Hcl Er Beads 240 Mg Xr24h-cap (Diltiazem hcl er beads) .Marland Kitchen.. 1 by mouth two times a day 3)  Requip 0.5 Mg Tabs (Ropinirole hcl) .... As directed 4)  Zoloft 100 Mg Tabs (Sertraline hcl) .... Take one by mouth daily 5)  Pradaxa 150 Mg Caps (Dabigatran etexilate mesylate) .... One by mouth two times a day 6)  Metoprolol Tartrate 25 Mg Tabs (Metoprolol tartrate) .... Take one tablet by mouth three times daily 7)  Ambien 5 Mg Tabs (Zolpidem tartrate) .... One by mouth at bedtime  Patient Instructions: 1)  RTC for Comp Exam...use 2 appts first week of New Year, labs prior. 2)  Take Guaifenesin by going to [facility] or [facility] and getting MUCOUS RELIEF EXPECTORANT (400mg ), take 11/2 tabs by mouth  AM and NOON. 3)  Drink lots of fluids anytime taking Guaifenesin.  4)  Take Aleve 2 tabs after brfst and supper. 5)  If still having discharge after two weeks, add Claritin, Allegra or Zyrtec (generic fine) 6)  He asked about B12...will check via lab. 7)  Has night sweats and will check CBC for exam when scheduled.   Orders Added: 1)  Est. Patient Level III [16109]    Current Allergies (reviewed today): No known allergies

## 2010-10-07 NOTE — Assessment & Plan Note (Signed)
Summary: Congestion, cough, ST /lsf   Vital Signs:  Patient profile:   65 year old male Height:      67 inches Weight:      170.75 pounds Temp:     98.2 degrees F oral Pulse rate:   84 / minute Pulse rhythm:   regular BP sitting:   120 / 80  (left arm) Cuff size:   regular  Vitals Entered By: Sydell Axon LPN (Jan 26, 2010 2:33 PM) CC: Productive cough/yellow, chest congestion and sore throat   History of Present Illness: Pt here for congestion and coughing which began Sat. He has no fever but some chills, no ear pain or headache, he has rhinitis that is yellowish, ST, he has cough that is productive of yellow sputum, no SOB, no V, some nausea. He has taken Nyquil.  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 1 Tab Two Times A Day 2)  Zoloft 50 Mg Tabs (Sertraline Hcl) .Marland Kitchen.. 1 Tab Once Daily 3)  Triazolam 0.25 Mg Tabs (Triazolam) .Marland Kitchen.. 1 Tab At Bedtime 4)  Bayer Aspirin 325 Mg Tabs (Aspirin) .... Take 1 Tablet By Mouth Once A Day 5)  Diltiazem Hcl Er Beads 240 Mg Xr24h-Cap (Diltiazem Hcl Er Beads) .Marland Kitchen.. 1 Once Daily 6)  Flecainide Acetate 100 Mg Tabs (Flecainide Acetate) .... One By Mouth Two Times A Day 7)  Requip 0.5 Mg  Tabs (Ropinirole Hcl) .... As Directed  Allergies: No Known Drug Allergies  Physical Exam  General:  Well-developed,well-nourished,in no acute distress; alert,appropriate and cooperative throughout examination, minimally congested. Head:  Normocephalic and atraumatic without obvious abnormalities. No apparent alopecia but mild  balding. Sinuses slightly tender max. Eyes:  Conjunctiva clear bilaterally.  Ears:  External ear  exam shows no significant lesions or deformities.  Otoscopic examination reveals clear canals, tympanic membranes are intact bilaterally without bulging, retraction, inflammation or discharge. Hearing is grossly normal bilaterally. Nose:  External nasal examination shows no deformity or inflammation. Nasal mucosa are pink and moist without lesions or exudates.Mild inflamm bilat. Mouth:  Oral mucosa and oropharynx without lesions or exudates.  Teeth in good repair. Neck:  Neck supple, no JVD. No masses, thyromegaly or abnormal cervical nodes. Lungs:  Clear bilaterally to auscultation and percussion. Heart:  Non-displaced PMI, chest non-tender; regular rate and rhythm, S1, S2 without murmurs, rubs or gallops. Carotid upstroke normal, no bruit. Normal abdominal aortic size, no bruits. Femorals normal pulses, no bruits. Pedals normal pulses. No edema, no varicosities.   Impression & Recommendations:  Problem # 1:  URI (ICD-465.9) Assessment New  See instructions. His updated medication list for this problem includes:    Bayer Aspirin 325 Mg Tabs (Aspirin) .Marland Kitchen... Take 1 tablet by mouth once a day  Instructed on symptomatic treatment. Call if symptoms persist or worsen.   Complete Medication List: 1)  Flomax 0.4 Mg Caps (Tamsulosin hcl) .Marland Kitchen.. 1 tab two times a day 2)  Bayer Aspirin 325 Mg Tabs (Aspirin) .... Take 1 tablet by mouth once a day 3)  Diltiazem Hcl Er Beads 240 Mg Xr24h-cap (Diltiazem hcl er beads) .Marland Kitchen.. 1 once daily 4)  Flecainide Acetate 100 Mg Tabs (Flecainide acetate) .Marland KitchenMarland KitchenMarland Kitchen  One by mouth two times a day 5)  Requip 0.5 Mg Tabs (Ropinirole hcl) .... As directed 6)  Zoloft 100 Mg Tabs (Sertraline hcl) .... Take one by mouth daily 7)  Amoxicillin 500 Mg Caps (Amoxicillin) .... 2 tabs by mouth two times a day  Other Orders: Tdap => 42yrs IM (16109) Admin 1st Vaccine (60454)  Patient Instructions: 1)  Take Guaifenesin by [facility] to [facility] or [facility] and getting MUCOUS  RELIEF EXPECTORANT (400mg ), take 11/2 tabs by mouth AM and NOON. 2)  Drink lots of fluids anytime taking Guaifenesin.  3)  Keep lozenge in mouth while awake. 4)  Take Tyl Es 2 tabs three times a day. 5)  If worse or no impr by end of next week, start Anmox 6)  Tdap today. Prescriptions: AMOXICILLIN 500 MG CAPS (AMOXICILLIN) 2 tabs by mouth two times a day  #40 x 0   Entered and Authorized by:   Shaune Leeks MD   Signed by:   Shaune Leeks MD on 01/26/2010   Method used:   Print then Give to Patient   RxID:   [id number]   Current Allergies (reviewed today): No known allergies    Tetanus/Td Vaccine    Vaccine Type: Tdap    Site: left deltoid    Mfr: GlaxoSmithKline    Dose: 0.5 ml    Route: IM    Given by: Sydell Axon LPN    Exp. Date: 11/28/2011    Lot #: [id number]    VIS given: 07/24/07 version given Jan 26, 2010.

## 2010-10-07 NOTE — Medication Information (Signed)
Summary: rov/sp  Anticoagulant Therapy  Managed by: Leota Sauers, PharmD, BCPS, CPP Referring MD: Allred PCP: Laurita Quint Supervising MD: Cassell Clement, MD Indication 1: Atrial Fibrillation Lab Used: [facility] [place] Site: [address] INR POC 1.7 INR RANGE 2.0-3.0  Dietary changes: no    Health status changes: no    Bleeding/hemorrhagic complications: no    Recent/future hospitalizations: yes       Details: plan for an ablation in near future at [facility]  Any changes in medication regimen? no    Recent/future dental: no  Any missed doses?: no       Is patient compliant with meds? yes       Current Medications (verified): 1)  Flomax 0.4 Mg Caps (Tamsulosin Hcl) .Marland Kitchen.. 1 Tab Two Times A Day 2)  Diltiazem Hcl Er Beads 240 Mg Xr24h-Cap (Diltiazem Hcl Er Beads) .Marland Kitchen.. 1 By Mouth Two Times A Day 3)  Requip 0.5 Mg  Tabs (Ropinirole Hcl) .... As Directed 4)  Zoloft 100 Mg Tabs (Sertraline Hcl) .... Take One By Mouth Daily 5)  Pradaxa 150 Mg Caps (Dabigatran Etexilate Mesylate) .... One By Mouth Two Times A Day 6)  Metoprolol Tartrate 25 Mg Tabs (Metoprolol Tartrate) .... Take One Tablet By Mouth Three Times Daily 7)  Ambien 5 Mg Tabs (Zolpidem Tartrate) .... One By Mouth At Bedtime 8)  Warfarin Sodium 5 Mg Tabs (Warfarin Sodium) .... Use As Directed By Anticoagulation Clinic  Allergies (verified): No Known Drug Allergies  Anticoagulation Management History:      The patient is taking warfarin and comes in today for a routine follow up visit.  Negative risk factors for bleeding include an age less than 83 years old.  The bleeding index is 'low risk'.  Negative CHADS2 values include Age > 35 years old.  His last INR was 0.8 ratio.  Anticoagulation responsible provider: Cassell Clement, MD.  INR POC: 1.7.  Exp: 10/2011.    Anticoagulation Management Assessment/Plan:      The patient's current anticoagulation dose is Warfarin sodium 5 mg tabs: Use as directed by  Anticoagulation Clinic.  The target INR is 2.0-3.0.  The next INR is due 09/15/2010.  Results were reviewed/authorized by Leota Sauers, PharmD, BCPS, CPP.         Prior Anticoagulation Instructions: INR 1.4  Start taking 5mg  daily except 2.5mg  on Saturday and Sunday.  Recheck on Wednesday.    Current Anticoagulation Instructions: INR 1.7 Coumadin 6mg  taoday  Then Coumadin 5mg  = 1 tab each day

## 2010-10-07 NOTE — Medication Information (Signed)
Summary: rov/tm  Anticoagulant Therapy  Managed by: Porfirio Oar, PharmD Referring MD: [name], MD PCP: Teresa Pelton Supervising MD: Stanford Breed MD, Aaron Edelman Indication 1: Atrial Fibrillation Lab Used: [facility]  [place] Site: [facility] INR POC 1.1 INR RANGE 2.0-3.0  Dietary changes: no    Health status changes: no    Bleeding/hemorrhagic complications: no    Recent/future hospitalizations: yes       Details: Ablation 1/11  Any changes in medication regimen? no    Recent/future dental: no  Any missed doses?: no       Is patient compliant with meds? yes       Allergies: 1)  Penicillin  Anticoagulation Management History:      The patient is taking warfarin and comes in today for a routine follow up visit.  Negative risk factors for bleeding include an age less than 27 years old.  The bleeding index is 'low risk'.  Negative CHADS2 values include Age > 80 years old.  His last INR was 0.8 ratio.  Anticoagulation responsible provider: Stanford Breed MD, Aaron Edelman.  INR POC: 1.1.  Cuvette Lot#: [id number].  Exp: 09/2011.    Anticoagulation Management Assessment/Plan:      The patient's current anticoagulation dose is Warfarin sodium 5 mg tabs: Use as directed by Anticoagulation Clinic.  The target INR is 2.0-3.0.  The next INR is due 09/28/2010.  Anticoagulation instructions were given to spouse.  Results were reviewed/authorized by Porfirio Oar, PharmD.  He was notified by Nilda Simmer, PharmD Candidate .         Prior Anticoagulation Instructions: 09/17/10:  Spoke with wife and she states that pt received Lovenox on Monday and Tuesday per [facility]. Ablation was on Wed, Discharged on Thurs. [facility] discharged pt home per wife on 2.30ms daily and recheck on Tues., thus appt made.   Current Anticoagulation Instructions: INR 1.1  Coumadin 5 mg tablets - Resume 1 tablet per day

## 2010-10-07 NOTE — Medication Information (Signed)
Summary: Coumadin Clinic  Anticoagulant Therapy  Managed by: Inactive Referring MD: [name], MD PCP: Teresa Pelton Supervising MD: Stanford Breed MD, Aaron Edelman Indication 1: Atrial Fibrillation Lab Used: [facility]  [place] Site: [place] INR RANGE 2.0-3.0          Comments: on 09/22/10 Dr Rayann Heman stopped coumadin.   Allergies: 1)  Penicillin  Anticoagulation Management History:      Negative risk factors for bleeding include an age less than 43 years old.  The bleeding index is 'low risk'.  Negative CHADS2 values include Age > 83 years old.  His last INR was 0.8 ratio.  Anticoagulation responsible provider: Stanford Breed MD, Aaron Edelman.  Exp: 09/2011.    Anticoagulation Management Assessment/Plan:      The target INR is 2.0-3.0.  The next INR is due 09/28/2010.  Anticoagulation instructions were given to spouse.  Results were reviewed/authorized by Inactive.         Prior Anticoagulation Instructions: INR 1.1  Coumadin 5 mg tablets - Resume 1 tablet per day  Appended Document: Coumadin Clinic pt now on pradaxa 140m two times a day

## 2010-10-07 NOTE — Assessment & Plan Note (Signed)
Summary: eph/post ablation/amber   Visit Type:  Follow-up Referring Provider:  Dr. Jenell Milliner Primary Provider:  Teresa Pelton   History of Present Illness: Mr Lady is a pleasant 65 yo WM with a h/o rheumatic fever and subsequent constrictive pericarditis requiring pericardiectomy who presents for follow-up after his recent atrial flutter ablation.  He states that post CTI ablation, he remained in sinus for 3 days.  He unfortunately, returned to afib.  He continues to have fatigue during episodes of afib.  He denies symptoms of chest pain, shortness of breath, orthopnea, PND, lower extremity edema, dizziness, presyncope, syncope, or neurologic sequela.  He denies complications post ablation.  The patient is tolerating medications without difficulties and is otherwise without complaint today.   Current Medications (verified): 1)  Flomax 0.4 Mg Caps (Tamsulosin Hcl) .Marland Kitchen.. 1 Tab Two Times A Day 2)  Diltiazem Hcl Er Beads 240 Mg Xr24h-Cap (Diltiazem Hcl Er Beads) .Marland Kitchen.. 1 By Mouth Two Times A Day 3)  Flecainide Acetate 100 Mg Tabs (Flecainide Acetate) .... One By Mouth Two Times A Day 4)  Requip 0.5 Mg  Tabs (Ropinirole Hcl) .... As Directed 5)  Zoloft 100 Mg Tabs (Sertraline Hcl) .... Take One By Mouth Daily 6)  Pradaxa 150 Mg Caps (Dabigatran Etexilate Mesylate) .... One By Mouth Two Times A Day 7)  Metoprolol Tartrate 25 Mg Tabs (Metoprolol Tartrate) .... Take One Tablet By Mouth Twice A Day  Allergies (verified): No Known Drug Allergies  Past History:  Past Medical History: Persistent atrial fibrillation typical appearing atrial flutter dx 04/28/10 s/p CTI ablation 10/11 + rheumatic fever as a child CONSTRICTIVE PERICARDITIS (unknown cause) s/p pericardectomy 1969 Depression DJD BPH ?liver cirrhosis diagnosed at time of cholecystectomy recurrent syncope s/p Tilt study by Dr [name] 1997 (likely due to dysautonomia) Gout  Past Surgical History: Reviewed history from 12/30/2009 and  no changes required. Pericardioectomy [facility] at [facility] he had osteoarthritis with left  knee surgery in 2007,  hemorrhoidectomy in 2007, cholecystectomy in 1995,  right thumb surgery for osteoarthritis  Family History: Reviewed history from 10/22/2009 and no changes required.  Positive for his father having a heart attack at age  39.   Social History: Reviewed history from 12/30/2009 and no changes required. Pt lives in [place] with spouse.  He does not smoke.   He does not drink.   He enjoys walking on a regular basis.   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    Review of Systems       All systems are reviewed and negative except as listed in the HPI.   The patient has also been having significant difficulty sleeping over the past few nights.  He states that he has been unable to fall asleep until 4am the past few nights.  Vital Signs:  Patient profile:   65 year old male Height:      67 inches Weight:      177 pounds BMI:     27.82 Pulse rate:   105 / minute BP sitting:   112 / 75  (right arm)  Vitals Entered By: Margaretmary Bayley CMA (July 23, 2010 11:54 AM)  Physical Exam  General:  Well developed, well nourished, in no acute distress. Head:  normocephalic and atraumatic Eyes:  PERRLA/EOM intact; conjunctiva and lids normal. Mouth:  Teeth, gums and palate normal. Oral mucosa normal. Neck:  supple, JVP 8cm Lungs:  Clear bilaterally to auscultation and percussion. Heart:  iRRR, no m/r/g Abdomen:  Bowel sounds positive; abdomen soft and non-tender without masses, organomegaly, or hernias noted. No hepatosplenomegaly. Msk:  Back normal, normal gait. Muscle strength and tone normal. Pulses:  pulses normal in all 4 extremities Extremities:  No clubbing or cyanosis. Neurologic:  Alert and oriented x 3. Skin:  Intact without lesions or rashes. Psych:  Normal affect.   Echocardiogram  Procedure date:   12/01/2009  Findings:       Study Conclusions    - Left ventricle: The cavity size was normal. Wall thickness was     normal. Systolic function was normal. The estimated ejection     fraction was 60%. Wall motion was normal; there were no regional     wall motion abnormalities. Features are consistent with a     pseudonormal left ventricular filling pattern, with concomitant     abnormal relaxation and increased filling pressure (grade 2     diastolic dysfunction).   - Aortic valve: There was no stenosis.   - Mitral valve: Mild regurgitation.   - Left atrium: The atrium was moderately dilated. LA volume 75 mL.   - Pulmonary arteries: PA peak pressure: 43m Hg (S).   - Inferior vena cava: The vessel was normal in size; the     respirophasic diameter changes were in the normal range (= 50%);     findings are consistent with normal central venous pressure.   Impressions:    - Normal LV size and systolic function, EF 600% Moderate diastolic     dysfunction. Moderate left atrial enlargement by atrial volume.     There is no interventricular interdependence noted and the IVC is     not dilated, which make constrictive pericarditis less likely. The     RV appears normal in size and systolic function. No pulmonary     hypertension.   Transthoracic echocardiography. M-mode, complete 2D, spectral   Doppler, and color Doppler. Height: Height: 170.2cm. Height: 67in.   Weight: Weight: 77.6kg. Weight: 170.6lb. Body mass index: BMI:   26.8kg/m^2. Body surface area: BSA: 1.848m. Blood pressure: 130/80.   Patient status: Outpatient. Location: [facility]     Signed by CaJulaine HuaCMA on 12/09/2009 at 8:32 AM  ________________________________________________________________________     Sleep Study  Procedure date:  12/21/2009  Findings:       OXYGEN DATA:  There was O2 desaturation as low as 84% transiently with   the patient's obstructive events.      CARDIAC DATA:  Rare PVC  noted, but no clinically significant arrhythmia   seen.      MOVEMENT-PARASOMNIA:  The patient was found to have large numbers of leg   jerks with only a rare arousal or awakening.  There were no obvious   behavioral abnormalities.      IMPRESSIONS-RECOMMENDATIONS:   1. Mild obstructive sleep apnea/hypopnea syndrome with an apnea-       hypopnea index of 14 events per hour and oxygen desaturation as low       as 84%.  Treatment for this degree of sleep apnea can include a       trial of weight loss alone, upper airway surgery, dental appliance,       and also continuous positive airway pressure.  Clinical       correlation is suggested.   2. Rare premature ventricular contraction noted, but no clinically       significant arrhythmia seen.  EKG  Procedure date:  07/24/2010  Findings:      afib,  V rates 105, nonspecific ST/T changes  Impression & Recommendations:  Problem # 1:  ATRIAL FIBRILLATION (ICD-427.31) Mr Corp presents today for EP follow-up after his recent cavo tricuspid isthmus ablation for atrial flutter.  He has unfortunately returned to afib despite medical therapy with flecainide.  He has also previously failed medical therapy with quinidine and AV nodal agents. Therapeutic strategies for afib including medicine and ablation were discussed in detail with the patient today.  I have offered tikosyn vs afib ablation.  I had initially planned pulmonary vein ablation at the time of his atrial flutter ablation, however, he was found to have a distorted left atrium and abnormal fossa ovale position.  I therefore felt it most prudent to not performe afib ablation at that time. Given his moderate LA enlargment, prior pericarditis with pericardiectomy, and also his h/o rheumatic fever, I am concerned that his anticipated success rate from ablation would be rather low and would almost certainly require multiple procedures.  After a long discussion about options  today, the patient and I agree that he should see Dr Saddie Benders at [facility] for his opinion and expertise with similar patients.  I will therefore make arrangements for the patient to be seen by Dr Omelia Blackwater at the next available time.  Problem # 2:  RESTLESS LEGS SYNDROME (ICD-333.94) The patient has known RLS and has been having progressive difficulty with sleep.  I have given him a 30 day trial of ambient but have encouraged him to follow-up with his PCP for further management of insomnia.  We discussed lifestyle modification to improve sleep also today.  In addition, I have reviewed the above sleep study from 4/11.  Problem # 3:  OBSTRUCTIVE SLEEP APNEA (ICD-327.23) mild by sleep study 4/11  Other Orders: Misc. Referral (Misc. Ref)  Patient Instructions: 1)  Your physician recommends that you schedule a follow-up appointment in: 8 weeks with Dr Rayann Heman 2)  You have been referred to Dr Saddie Benders at [facility] for afib ablation 3)  Givin 30 pills of Ambien take one nightly as needed  NO REFILLS Prescriptions: AMBIEN 5 MG TABS (ZOLPIDEM TARTRATE) one by mouth at bedtime  #30 x 0   Entered by:   Janan Halter, RN, BSN   Authorized by:   [name], MD   Signed by:   Janan Halter, RN, BSN on 07/23/2010   Method used:   Print then Give to Patient   RxID:   [id number]

## 2010-10-07 NOTE — Assessment & Plan Note (Signed)
Summary: per check out/ok per kelly/saf   Visit Type:  Follow-up Referring Provider:  Dr. Jenell Milliner Primary Provider:  Teresa Pelton   History of Present Illness: The patient presents today for routine electrophysiology followup. He reports doing very well since last being seen in our clinic.  His afib has significantly improved with flecainide.  He previoulsy had near daily afib, but has had only 1 30 minute episode of afib since last being seen in our clinic. The patient denies symptoms of palpitations, chest pain, shortness of breath, orthopnea, PND, lower extremity edema, dizziness, presyncope, syncope, or neurologic sequela. The patient is tolerating medications without difficulties and is otherwise without complaint today.   Current Medications (verified): 1)  Flomax 0.4 Mg Caps (Tamsulosin Hcl) .Marland Kitchen.. 1 Tab Two Times A Day 2)  Bayer Aspirin 325 Mg Tabs (Aspirin) .... Take 1 Tablet By Mouth Once A Day 3)  Diltiazem Hcl Er Beads 240 Mg Xr24h-Cap (Diltiazem Hcl Er Beads) .Marland Kitchen.. 1 Once Daily 4)  Flecainide Acetate 100 Mg Tabs (Flecainide Acetate) .... One By Mouth Two Times A Day 5)  Requip 0.5 Mg  Tabs (Ropinirole Hcl) .... As Directed 6)  Zoloft 100 Mg Tabs (Sertraline Hcl) .... Take One By Mouth Daily  Allergies (verified): No Known Drug Allergies  Past History:  Past Medical History: Reviewed history from 12/30/2009 and no changes required. Paroxysmal atrial fibrillation + rheumatic fever as a child CONSTRICTIVE PERICARDITIS (unknown cause) s/p pericardectomy 1969 Depression DJD BPH ?liver cirrhosis diagnosed at time of cholecystectomy recurrent syncope s/p Tilt study by Dr [name] 1997 (likely due to dysautonomia) Gout  Past Surgical History: Reviewed history from 12/30/2009 and no changes required. Pericardioectomy 1969 at [facility] he had osteoarthritis with left  knee surgery in 2007,  hemorrhoidectomy in 2007, cholecystectomy in 1995,  right thumb  surgery for osteoarthritis  Social History: Reviewed history from 12/30/2009 and no changes required. Pt lives in [place] with spouse.  He does not smoke.   He does not drink.   He enjoys walking on a regular basis.   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    Vital Signs:  Patient profile:   65 year old male Height:      67 inches Weight:      171 pounds BMI:     26.88 Pulse rate:   68 / minute BP sitting:   102 / 60  (left arm)  Vitals Entered By: Margaretmary Bayley CMA (February 03, 2010 10:08 AM)  Physical Exam  General:  Well developed, well nourished, in no acute distress. Head:  normocephalic and atraumatic Eyes:  PERRLA/EOM intact; conjunctiva and lids normal. Mouth:  Teeth, gums and palate normal. Oral mucosa normal. Neck:  supple, JVP 8cm Lungs:  Clear bilaterally to auscultation and percussion. Heart:  Non-displaced PMI, chest non-tender; regular rate and rhythm, S1, S2 without murmurs, rubs or gallops. Carotid upstroke normal, no bruit. Normal abdominal aortic size, no bruits. Femorals normal pulses, no bruits. Pedals normal pulses. No edema, no varicosities. Abdomen:  Bowel sounds positive; abdomen soft and non-tender without masses, organomegaly, or hernias noted. No hepatosplenomegaly. Msk:  Back normal, normal gait. Muscle strength and tone normal. Pulses:  pulses normal in all 4 extremities Extremities:  No clubbing or cyanosis. Neurologic:  Alert and oriented x 3.   EKG  Procedure date:  02/03/2010  Findings:      sinus rhythm 70 bpm, otherwise normal ekg  Impression &  Recommendations:  Problem # 1:  ATRIAL FIBRILLATION (ICD-427.31) doing well with flecainide will obtain a GXT to evaluate for exercise induced arrhythmias continue cardizem and ASA 330m daily (CHADs2 is 0)  Pt has h/o dysautonomia.  I have encouraged adequate hydration.  May consider finasteride over flomax if he has further symptoms of dysautonomia.  Will  defer this decision to PCP at this time.  Other Orders: Treadmill (Treadmill)  Patient Instructions: 1)  Your physician recommends that you schedule a follow-up appointment in: 4-6 weeks GXT for Flecainide with Dr ARayann Heman2)  Your physician has requested that you have an exercise tolerance test.  For further information please visit [email or web address]  Please also follow instruction sheet, as given.

## 2010-10-07 NOTE — Progress Notes (Signed)
  12,Stress,Echo faxed to [facility] @ [phone] [facility]  September 14, 2010 12:59 PM

## 2010-10-07 NOTE — Assessment & Plan Note (Signed)
Summary: rov to review sleep study   Copy to:  Dr. Valera Castle Primary Provider/Referring Provider:  Laurita Quint  CC:  Pt is here for a f/u appt to discuss sleep study results.  .  History of Present Illness: the pt comes in today for discussion of his recent sleep study.  He was found to have mild osa, with AHI of 14/hr, and desat to  84%.  He was also found to have large numbers of leg jerks, but very few resulted in sleep disruption.  I have reviewed the study with him in detail, and answered all of his questions.    Current Medications (verified): 1)  Flomax 0.4 Mg Caps (Tamsulosin Hcl) .Marland Kitchen.. 1 Tab Two Times A Day 2)  Zoloft 50 Mg Tabs (Sertraline Hcl) .Marland Kitchen.. 1 Tab Once Daily 3)  Triazolam 0.25 Mg Tabs (Triazolam) .Marland Kitchen.. 1 Tab At Bedtime 4)  Bayer Aspirin 325 Mg Tabs (Aspirin) .... Take 1 Tablet By Mouth Once A Day 5)  Diltiazem Hcl Er Beads 240 Mg Xr24h-Cap (Diltiazem Hcl Er Beads) .Marland Kitchen.. 1 Once Daily 6)  Flecainide Acetate 100 Mg Tabs (Flecainide Acetate) .... One By Mouth Two Times A Day  Allergies (verified): No Known Drug Allergies  Vital Signs:  Patient profile:   65 year old male Height:      67 inches Weight:      175 pounds BMI:     27.51 O2 Sat:      98 % on Room air Temp:     97.6 degrees F oral Pulse rate:   124 / minute BP sitting:   104 / 70  (left arm) Cuff size:   regular  Vitals Entered By: Arman Filter LPN (Jan 07, 8118 9:50 AM)  O2 Flow:  Room air CC: Pt is here for a f/u appt to discuss sleep study results.   Comments Medications reviewed with patient Arman Filter LPN  Jan 07, 1477 9:54 AM    Physical Exam  General:  wd male in nad   Impression & Recommendations:  Problem # 1:  OBSTRUCTIVE SLEEP APNEA (ICD-327.23) the pt does have mild osa, but it is unclear how much this is contributing to his symptoms vs. his leg movements.  The severity of his sleep apnea is not a CV risk factor for him, and his level of desaturation is minimal and  transient.  I have asked him to work on modest weight loss.  He can consider treatment of his osa with cpap or dental appliance if he does not respond to treatment of his leg movements.  Time spent with pt today was  Problem # 2:  RESTLESS LEGS SYNDROME (ICD-333.94) the pt has a very large number of leg jerks noted on his sleep study, but it is unclear whether this represents a primary movement disorder of sleep.  Given his sleep issues, I would like to emperically treat with a dopamine agonist and see how he does.  This may be more of an issue for him than his mild sleep disordered breathing.  Medications Added to Medication List This Visit: 1)  Bayer Aspirin 325 Mg Tabs (Aspirin) .... Take 1 tablet by mouth once a day 2)  Requip 0.5 Mg Tabs (Ropinirole hcl) .... As directed  Patient Instructions: 1)  start requip 0.5mg  one after dinner each night for the first week, then increase to 2 tabs after dinner. 2)  please call me in 3 weeks with response to the requip.  If  still not happy with sleep or your alertness during the day, would treat with cpap or dental appliance. 3)  work on weight loss.  Prescriptions: REQUIP 0.5 MG  TABS (ROPINIROLE HCL) as directed  #60 x 6   Entered and Authorized by:   Barbaraann Share MD   Signed by:   Barbaraann Share MD on 01/07/2010   Method used:   Print then Give to Patient   RxID:   [id number] REQUIP 0.5 MG  TABS (ROPINIROLE HCL) as directed  #60 x 6   Entered and Authorized by:   Barbaraann Share MD   Signed by:   Barbaraann Share MD on 01/07/2010   Method used:   Print then Give to Patient   RxID:   [id number]   Appended Document: Orders Update    Clinical Lists Changes  Orders: Added new Service order of Est. Patient Level III (44010) - Signed

## 2010-10-07 NOTE — Progress Notes (Signed)
Summary: calling about medication  Phone Note Call from Patient Call back at Home Phone [phone]   Caller: Spouse/patsy Summary of Call: Pt wife calling regarding pt medication Initial call taken by: Delsa Sale,  August 10, 2010 11:35 AM  Follow-up for Phone Call        see note from Beverly, RN, BSN  August 10, 2010 3:07 PM    New/Updated Medications: METOPROLOL TARTRATE 25 MG TABS (METOPROLOL TARTRATE) Take one tablet by mouth three times daily Prescriptions: METOPROLOL TARTRATE 25 MG TABS (METOPROLOL TARTRATE) Take one tablet by mouth three times daily  #90 x 3   Entered by:   Janan Halter, RN, BSN   Authorized by:   [name], MD   Signed by:   Janan Halter, RN, BSN on 08/10/2010   Method used:   Electronically to        [facility] [place] (retail)       [address] [place], [place]  [address]       Ph: [phone]       Fax: [phone]   RxID:   [id number]

## 2010-10-21 NOTE — Letter (Signed)
Summary: [facility] for [facility] for Atrial Fibrillation   Imported By: Sallee Provencal 10/13/2010 15:08:38  _____________________________________________________________________  External Attachment:    Type:   Image     Comment:   External Document

## 2010-10-21 NOTE — Consult Note (Signed)
Summary: Electrophysiology New Patient   Electrophysiology New Patient   Imported By: Sallee Provencal 10/14/2010 10:02:33  _____________________________________________________________________  External Attachment:    Type:   Image     Comment:   External Document

## 2010-10-27 NOTE — Letter (Signed)
Summary: [facility] - Discharge Instructions  [facility] - Discharge Instructions   Imported By: Marilynne Drivers 10/18/2010 15:12:39  _____________________________________________________________________  External Attachment:    Type:   Image     Comment:   External Document

## 2010-11-12 ENCOUNTER — Encounter: Payer: Self-pay | Admitting: Family Medicine

## 2010-11-17 LAB — MRSA PCR SCREENING: MRSA by PCR: NEGATIVE

## 2010-11-22 ENCOUNTER — Encounter: Payer: Self-pay | Admitting: Family Medicine

## 2010-11-25 ENCOUNTER — Ambulatory Visit (INDEPENDENT_AMBULATORY_CARE_PROVIDER_SITE_OTHER): Payer: Medicare Other | Admitting: Family Medicine

## 2010-11-25 ENCOUNTER — Encounter: Payer: Self-pay | Admitting: Family Medicine

## 2010-11-25 DIAGNOSIS — R61 Generalized hyperhidrosis: Secondary | ICD-10-CM

## 2010-11-25 DIAGNOSIS — I4891 Unspecified atrial fibrillation: Secondary | ICD-10-CM

## 2010-11-25 LAB — CBC
HCT: 41.8 % (ref 39.0–52.0)
Hemoglobin: 14.6 g/dL (ref 13.0–17.0)
MCH: 30.5 pg (ref 26.0–34.0)
MCHC: 34.9 g/dL (ref 30.0–36.0)
MCV: 87.4 fL (ref 78.0–100.0)
Platelets: 183 10*3/uL (ref 150–400)
RBC: 4.78 MIL/uL (ref 4.22–5.81)
RDW: 13.4 % (ref 11.5–15.5)
WBC: 7.9 10*3/uL (ref 4.0–10.5)

## 2010-11-25 NOTE — Progress Notes (Signed)
  Subjective:    Patient ID: Ernest Bell, male    DOB: 07-Mar-1946, 65 y.o.   MRN: [id number]  HPI Pt here with his wife. He has been seen and treated at [facility] for arrythmias with EP studies and ablation tx for Afib, from which he has reportedly done well. On his last exam he had bloodwork done which showed an elevated total bilirubin, all other liver parameters nml..  Review of Systems Noncontributory except per HPI     Objective:   Physical Exam GEN: nad, alert and oriented HEENT: mucous membranes moist NECK: supple w/o LA CV: rrr.  no murmur PULM: ctab, no inc wob ABD: soft, +bs EXT: no edema SKIN: no acute rash        Assessment & Plan:  Hyperbilirubinema

## 2010-11-26 ENCOUNTER — Encounter: Payer: Self-pay | Admitting: Family Medicine

## 2010-11-26 LAB — HEPATIC FUNCTION PANEL
ALT: 32 U/L (ref 0–53)
AST: 27 U/L (ref 0–37)
Albumin: 4.3 g/dL (ref 3.5–5.2)
Alkaline Phosphatase: 52 U/L (ref 39–117)
Bilirubin, Direct: 0.2 mg/dL (ref 0.0–0.3)
Total Bilirubin: 1.8 mg/dL — ABNORMAL HIGH (ref 0.3–1.2)
Total Protein: 6.4 g/dL (ref 6.0–8.3)

## 2010-11-26 NOTE — Assessment & Plan Note (Signed)
New finding on last set of labs at [facility]. He had no changes so wonder if this is some kind of lab error. Will repeat CBC, Hepatic and now fractionate the Bili result. Await labs. May need referral to GI for eval if again returns high.

## 2010-11-26 NOTE — Assessment & Plan Note (Signed)
Less of a problem at this point. Will continue to follow.

## 2010-11-26 NOTE — Assessment & Plan Note (Signed)
Afib well controlled since his ablation. Is looking to get off Flecainide. Feels well but still has his fatigue he has had since his Afib started.

## 2010-11-26 NOTE — Patient Instructions (Signed)
Bloodwork today and await results. May need referral to GI.

## 2010-12-01 NOTE — Progress Notes (Signed)
Addended by: Laurita Quint on: 12/01/2010 01:00 PM   Modules accepted: Orders

## 2010-12-02 NOTE — Letter (Signed)
Summary: Dr.William Cockfield,[facility],Note  Dr.William Cockfield,[facility],Note   Imported By: Beau Fanny 11/19/2010 11:25:43  _____________________________________________________________________  External Attachment:    Type:   Image     Comment:   External Document  Appended Document: Dr.William Cockfield,[facility],Note    Clinical Lists Changes  Observations: Added new observation of PAST SURG HX: Pericardioectomy 1969 at [facility] he had osteoarthritis with left  knee surgery in 2007,  hemorrhoidectomy in 2007, cholecystectomy in 1995,  right thumb surgery for osteoarthritis CTI ablatioin at [facility] 04/28/10 afib ablation (Cryo) at [facility] 09/15/10 (11/22/2010 11:07)       Past Surgical History:    Pericardioectomy 1969 at [facility]    he had osteoarthritis with left  knee surgery in 2007,     hemorrhoidectomy in 2007,    cholecystectomy in 1995,     right thumb surgery for osteoarthritis    CTI ablatioin at [facility] 04/28/10    afib ablation (Cryo) at [facility] 09/15/10

## 2011-01-18 NOTE — Assessment & Plan Note (Signed)
[place] OFFICE NOTE   HAAKON, TITSWORTH                       MRN:          [id number]  DATE:06/27/2008                            DOB:          1946/01/25    [name] is a 65 year old gentleman that I have not seen for  followup in about 4 years.   I last saw on May 31, 2004.  He has a history of constrictive  pericarditis status post pericardial stripping at [facility] in 1966.  Echocardiogram here has been stable with no evidence of any scar tissue  restrictive physiology.  He also had a history of neurocardiogenic  syncope with both of vasodepressor and cardioinhibitory response by tilt  table, December 05, 1994.  He responded beautifully to Zoloft per Ernest Landry  Bell's recommendation.   He has a history of tachy palpitations particularly when he lies down,  he is extra fatigue, lays on his left side.   His biggest complaint was about 3 weeks ago while walking around the  golf with his grandson.  He began to have some chest tightness and some  weakness.  He went over and got into the shade.  He felt better.  He has  had some chest tightness off and on for the last several months, but  mostly not with exertion.   He has never had his cholesterol checked that he knows of.  He does not  smoke, do not have diabetes.   PAST MEDICAL HISTORY:  Other than the above, he has no history of dye  allergy.  He has no allergy to other medications.  He does not smoke.  He does not drink.   He enjoys walking on a regular basis.   MEDICATIONS:  1. Flomax 0.4 two per day.  2. Zoloft 50 mg a day.  3. Voltaren gel daily.  4. Xanax 0.5 mg t.i.d.  5. Depakote 750 mg a day.   SURGICAL HISTORY:  Other than his heart, he had osteoarthritis with left  knee surgery in 2007, hemorrhoidectomy in 2007, cholecystectomy in 1995,  right thumb surgery for osteoarthritis as well.   FAMILY HISTORY:  Positive for  his father having a heart attack at age  39.   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   He is married.   REVIEW OF SYSTEMS:  He has a history of clinical depression, non-  alcoholic cirrhosis, follow with Dr. Verl Bell, portal vein  restriction or clot, and short-term memory loss.   Rest of his review of systems are negative in the HPI.   PHYSICAL EXAMINATION:  VITAL SIGNS:  His blood pressure is 130/70, his  pulse 67 and regular, his weight is 173, height 5 feet 6-1/2 inches.  HEENT:  Unremarkable.  He has a beard.  Carotid upstrokes were equal  bilaterally without bruits.  There is no sign of elevated JVP with  normal venous waves and collapse with inspiration.  Thyroid is not  enlarged.  Trachea is midline.  LUNGS:  Clear to auscultation and percussion.  HEART:  Nondisplaced PMI, normal S1 and S2.  There is no pericardial  knock, no right ventricular lift.  ABDOMINAL:  Soft, good bowel sounds, no midline bruit.  No hepatomegaly.  EXTREMITIES:  No cyanosis, clubbing, or edema.  Pulses are present.  NEURO:  Intact.  SKIN:  Unremarkable.   EKG is essentially normal.   [name] seems to be doing well from his previous cardiac and  cardiovascular history, however, I am concerned about his exertional  tightness on the golf course with presyncope.   PLAN:  1. Exercise rest stress echocardiogram.  2. Check fasting lipids and a CMP on the day of his stress test.     Ernest Moores C. Verl Blalock, MD, [facility]  Electronically Signed    TCW/MedQ  DD: 06/27/2008  DT: 06/28/2008  Job #: [id number]   cc:   Ernest Charon, MD

## 2011-01-21 NOTE — Assessment & Plan Note (Signed)
[place] OFFICE NOTE   Ernest Bell, Ernest Bell                       MRN:          [id number]  DATE:07/06/2006                            DOB:          Dec 08, 1945    Ernest Bell has rectal pain, refractory to sitz baths and Preparation-H.  He has  2 loose bowel movements a day but otherwise seems to be doing well.  He  really denies any other GI or hepatobiliary complaints, except for the  continued mentation problems.   He has a long history of constrictive pericarditis, chronic passive  congestion of his liver without evidence of significant cirrhosis.  He  recently underwent a thorough laboratory workup in April 2006 which showed  normal stool fat exams, normal serum ammonia, CBC, prothrombin time, iron  studies, thyroid function tests, B-12, and liver function tests.  Serum  albumin was 3.9 g%.   Empiric treatment in the past with pancreatic extracts have not helped his  diarrhea.  He has had no real anorexia or weight loss.  Last CT scan of  the abdomen in April 2006 showed pericardial calcification, a 6 mm right  lower lobe nodule, and a regular outline of the liver with some atrophy, no  hepatic mass.  His mid spleen was mildly enlarged.   Exam today shows, my exam, his mental status to be clear.  I cannot appreciate hepatosplenomegaly, abdominal masses, tenderness, or  ascites.  Weight today was 177 pounds, which is his normal weight, and blood pressure  is 120/72 and pulse was 60 and regular.  Examination of his rectum showed some thrombosed, inflamed external  hemorrhoids.   ASSESSMENT:  1. Thrombosed external hemorrhoids that will need surgical excision.  2. 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  3. Calcific pericarditis.  4. Status post cholecystectomy.  5. Mild dementia of unexplained etiology.  6. Chronic benzodiazepine use for chronic anxiety and  depression along      with chronic Depakote ER 500 mg b.i.d. use.   RECOMMENDATIONS:  1. Referral to surgery for lancing of his hemorrhoids.  2. Repeat basic laboratory screening.  3. We will send a letter to his primary care physician and he probably      should have at least repeat chest x-ray or CT scan of his chest for his      previous pulmonary nodule.     Loralee Pacas. [name], MD, [name], [place]  Electronically Signed    DRP/MedQ  DD: 07/06/2006  DT: 07/06/2006  Job #: [id number]   cc:   Dr. Durel Salts C. Wall, MD, [facility]

## 2011-01-21 NOTE — Op Note (Signed)
Ernest Bell. Ernest Bell  Patient:    Ernest, Bell                       MRN: [id number] Proc. Date: 03/06/01 Adm. Date:  [id number] Attending:  Sypher, Ernest Bell CC:         Ernest Bell, M.D. Ernest Bell  Ernest Bell, M.D. [facility]  Anesthesia Department   Operative Report  PREOPERATIVE DIAGNOSES: 1. Chronic Ernest Bell stage III carpometacarpal arthritis, right thumb. 2. Chronic right carpal tunnel syndrome.  POSTOPERATIVE DIAGNOSES: 1. Chronic Ernest Bell stage III carpometacarpal arthritis, right thumb. 2. Chronic right carpal tunnel syndrome.  PROCEDURES: 1. Excision of right trapezium with synovectomy and soft tissue interposition    arthroplasty/suspensionplasty. 2. Palmaris longus intermetacarpal ligament reconstruction. 3. Through separate incision, right carpal tunnel release.  SURGEON:  Ernest Bell. Sypher, Ernest Bell., M.D.  ASSISTANT:  Ernest Bell, P.A.  ANESTHESIA:  Axillary block supervised by the anesthesiologist, Ernest Bell. Ernest Bell, M.D.  INDICATIONS:  Ernest Bell is a 65 year old man who has had chronic right hand numbness and pain at the base of his thumb.  In 1997 he was evaluated for possible carpal tunnel syndrome and had electrodiagnostic studies performed by Dr. Rosiland Bell, which confirmed bilateral median neuropathy.  He returns at this time for hand surgery consult due to chronic pain at the base of his thumb.  Clinical examination revealed signs of severe CMC arthritis with Ernest Bell stage III changes on his plain films.  He continued to show signs of carpal tunnel syndrome.  We recommended proceeding with reconstruction of his right thumb with soft tissue interposition arthroplasty and intermetacarpal ligament reconstruction with a free tendon graft and right carpal tunnel release.  Prior to surgery, questions were invited and answered.  DESCRIPTION OF PROCEDURE:  Demorio Seeley was brought to the operating room and placed in the  supine position upon the operating table.  Following axillary block in the holding area, anesthesia was satisfactory in the right arm.  The arm was prepped with Betadine soap and solution and sterilely draped. Ernest Bell 1 g was administered as an IV prophylactic antibiotic.  Following exsanguination of the limb with an Ernest Bell bandage, the arterial tourniquet was inflated to 220 mmHg.  The procedure commenced with a short incision in the line of the ring finger in the palm.  Subcutaneous tissues were carefully divided, revealing the palmar fascia.  This was split longitudinally to reveal the common sensory branch of the median nerve.  This was followed back to the transverse carpal ligament, which was carefully isolated from the median nerve.  The ligament was released on its ulnar border, extending into the distal forearm.  This widely opened the carpal canal.  No masses or other predicaments were noted.  The wound was then repaired with intradermal 3-0 Prolene suture.  Attention was then directed to the base of the thumb.  A curvilinear incision was fashioned directly over the [facility] joint.  Subcutaneous tissues were carefully divided, identifying the interval between the abductor pollicis longus and extensor pollicis brevis tendons.  The trapezium was exposed subperiosteally with an osteotome, followed by morcellization and piecemeal removal of the entire trapezium.  Care was taken to preserve the flexor carpi radialis tendon.  Care was also taken to identify the radial artery dorsally and protect it throughout dissection.  A complete synovectomy was performed, with removal of multiple loose bodies.  The palmaris longus was then harvested through a short transverse palmar incision at  the distal palmar crease with a Ernest Bell tendon stripper.  This was cleared of all soft tissues.  Drill holes were created through the base of the index metacarpal distal to the articular facet for the  thumb metacarpal and to the base of the thumb metacarpal at a 45 degree angle from 1 cm distal to the articular facet on the dorsal surface of the metacarpal to the central portion of the articular surface.  The palmaris longus tendon was then looped over the cortex of the index metacarpal and both tails brought into the cavity created by trapezium resection.  One tendon slip was placed deep to the radial artery and the second through the drill hole.  Both were brought through the bottom of the thumb metacarpal to recreate an intermetacarpal ligament.  One tendon was then sutured to the dorsal incision of the abductor pollicis longus with Ernest Bell weave technique and multiple interrupted sutures of 3-0 Ethibond.  The tendon was then tensioned to properly suspend the thumb.  The free tail that was exiting from the dorsal surface of the thumb metacarpal was then brought back through the abductor pollicis longus tendon slip and secured with sutures of 3-0 Ethibond, followed by passage of the tendon through the index metacarpal and anchoring of the tendon to the extensor carpi radialis brevis dorsally with a Ernest Bell weave.  This led to a very satisfactory suspensionplasty of the thumb metacarpal.  The thumb metacarpal was then pinned to the index and long finger metacarpals with a 0.045 inch Kirschner wire, maintaining proper height of the suspensionplasty.  The wounds were thoroughly lavaged with sterile saline, followed by repair of the wounds with intradermal 3-0 Prolene and Steri-Strips.  The tourniquet was released, and a voluminous gauze dressing was applied with a thumb spica splint and a volar plaster splint maintaining the wrist in a neutral position. There were no apparent complications. DD:  03/06/01 TD:  03/06/01 Job: [id number]

## 2011-07-06 DIAGNOSIS — N400 Enlarged prostate without lower urinary tract symptoms: Secondary | ICD-10-CM | POA: Insufficient documentation

## 2011-07-06 DIAGNOSIS — Z9289 Personal history of other medical treatment: Secondary | ICD-10-CM | POA: Insufficient documentation

## 2011-07-06 DIAGNOSIS — Z87898 Personal history of other specified conditions: Secondary | ICD-10-CM | POA: Insufficient documentation

## 2011-07-06 DIAGNOSIS — Z8679 Personal history of other diseases of the circulatory system: Secondary | ICD-10-CM | POA: Insufficient documentation

## 2011-07-06 DIAGNOSIS — Z9889 Other specified postprocedural states: Secondary | ICD-10-CM | POA: Insufficient documentation

## 2011-07-26 ENCOUNTER — Ambulatory Visit (INDEPENDENT_AMBULATORY_CARE_PROVIDER_SITE_OTHER): Payer: Medicare Other | Admitting: Family Medicine

## 2011-07-26 ENCOUNTER — Encounter: Payer: Self-pay | Admitting: Family Medicine

## 2011-07-26 DIAGNOSIS — Z23 Encounter for immunization: Secondary | ICD-10-CM

## 2011-07-26 DIAGNOSIS — R202 Paresthesia of skin: Secondary | ICD-10-CM

## 2011-07-26 DIAGNOSIS — K6289 Other specified diseases of anus and rectum: Secondary | ICD-10-CM

## 2011-07-26 DIAGNOSIS — R209 Unspecified disturbances of skin sensation: Secondary | ICD-10-CM

## 2011-07-26 DIAGNOSIS — R17 Unspecified jaundice: Secondary | ICD-10-CM

## 2011-07-26 NOTE — Patient Instructions (Addendum)
Refer to surgery for rectal pain/coccyx pain. ?Dr Daphine Deutscher? Look into Zostavax (shingles shot).  Pneumovax today. Zostavax in one month if he is going to get it.

## 2011-07-26 NOTE — Progress Notes (Signed)
Addended by: Arta Silence on: 07/26/2011 03:15 PM   Modules accepted: Orders

## 2011-07-26 NOTE — Progress Notes (Signed)
Addended by: Ellamae Sia on: 07/26/2011 02:56 PM   Modules accepted: Orders

## 2011-07-26 NOTE — Assessment & Plan Note (Signed)
Recheck level today. 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

## 2011-07-26 NOTE — Assessment & Plan Note (Signed)
Could be from chronic back pain. Could be from rectal fistula affecting the local nerves radiating to the feet. Could be B12 deficiency. Will check today via lab. Will report labs via phone tree.

## 2011-07-26 NOTE — Assessment & Plan Note (Signed)
Rectal exam signif for 12 o'clock pain with rectal exam, no fluctulance or swelling felt. Will have him seen by surgery for eval.
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
## 2011-07-27 LAB — CBC WITH DIFFERENTIAL/PLATELET
Basophils Absolute: 0 10*3/uL (ref 0.0–0.1)
Basophils Relative: 0.5 % (ref 0.0–3.0)
Eosinophils Absolute: 0.2 10*3/uL (ref 0.0–0.7)
Eosinophils Relative: 2.6 % (ref 0.0–5.0)
HCT: 42.2 % (ref 39.0–52.0)
Hemoglobin: 14.5 g/dL (ref 13.0–17.0)
Lymphocytes Relative: 23.8 % (ref 12.0–46.0)
Lymphs Abs: 2 10*3/uL (ref 0.7–4.0)
MCHC: 34.5 g/dL (ref 30.0–36.0)
MCV: 92.3 fl (ref 78.0–100.0)
Monocytes Absolute: 0.7 10*3/uL (ref 0.1–1.0)
Monocytes Relative: 8.6 % (ref 3.0–12.0)
Neutro Abs: 5.4 10*3/uL (ref 1.4–7.7)
Neutrophils Relative %: 64.5 % (ref 43.0–77.0)
Platelets: 171 10*3/uL (ref 150.0–400.0)
RBC: 4.57 Mil/uL (ref 4.22–5.81)
RDW: 12.9 % (ref 11.5–14.6)
WBC: 8.3 10*3/uL (ref 4.5–10.5)

## 2011-07-27 LAB — HEPATIC FUNCTION PANEL
ALT: 27 U/L (ref 0–53)
AST: 24 U/L (ref 0–37)
Albumin: 4.3 g/dL (ref 3.5–5.2)
Alkaline Phosphatase: 60 U/L (ref 39–117)
Bilirubin, Direct: 0.2 mg/dL (ref 0.0–0.3)
Total Bilirubin: 2.3 mg/dL — ABNORMAL HIGH (ref 0.3–1.2)
Total Protein: 6.6 g/dL (ref 6.0–8.3)

## 2011-07-29 LAB — VITAMIN B12: Vitamin B-12: 361 pg/mL (ref 211–911)

## 2011-08-18 ENCOUNTER — Ambulatory Visit (INDEPENDENT_AMBULATORY_CARE_PROVIDER_SITE_OTHER): Payer: Medicare Other | Admitting: Surgery

## 2011-08-23 ENCOUNTER — Telehealth (INDEPENDENT_AMBULATORY_CARE_PROVIDER_SITE_OTHER): Payer: Self-pay

## 2011-08-23 ENCOUNTER — Telehealth: Payer: Self-pay | Admitting: Internal Medicine

## 2011-08-23 NOTE — Telephone Encounter (Signed)
Patient called and stated that he was referred to Dr. Daphine Deutscher @ [facility] and he doesn't have an appointment until jan. 11 and also they stated that he may need a MRI  And so he is still in a lot of pain and wanted to know what to do.

## 2011-08-23 NOTE — Telephone Encounter (Signed)
Could you please see if someone in Dr Ermalene Searing office could see this pt in the next few days. 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 He is not scheduled to be seen by Dr Daphine Deutscher until mid January.

## 2011-08-23 NOTE — Telephone Encounter (Signed)
error 

## 2011-08-24 ENCOUNTER — Other Ambulatory Visit: Payer: Self-pay | Admitting: Family Medicine

## 2011-08-24 DIAGNOSIS — K6289 Other specified diseases of anus and rectum: Secondary | ICD-10-CM

## 2011-08-25 NOTE — Telephone Encounter (Signed)
FYI, Dr Chauncey Cruel, Dr Hassell Done will see the Mr Matheson this Friday 08/26/2011 at 2:30pm!

## 2011-08-25 NOTE — Telephone Encounter (Signed)
Thank you :)

## 2011-08-26 ENCOUNTER — Ambulatory Visit (INDEPENDENT_AMBULATORY_CARE_PROVIDER_SITE_OTHER): Payer: Medicare Other | Admitting: Surgery

## 2011-08-26 ENCOUNTER — Encounter (INDEPENDENT_AMBULATORY_CARE_PROVIDER_SITE_OTHER): Payer: Self-pay | Admitting: Surgery

## 2011-08-26 VITALS — BP 122/72 | HR 60 | Temp 97.8°F | Resp 18 | Ht 66.0 in | Wt 168.4 lb

## 2011-08-26 DIAGNOSIS — K6289 Other specified diseases of anus and rectum: Secondary | ICD-10-CM

## 2011-08-26 NOTE — Progress Notes (Signed)
Ernest Bell comes in with a several month history of perirectal pain. He denies any bleeding or pain with BMs. He says it hurts to sit on his bottom.  Anal exam revealed the presence of some high external/internal hemorrhoids which are not prolapsed. I did an anoscopy on him and I can see these hemorrhoids but they don't appear to be thrombosed and there really looked to be that bad. I did not see a fissure. On palpation of the peri-anal region the left side seemed to be more full and also more tender to deep palpation. I could not say he doesn't have a neoplastic process.  Will order MR of pelvis. We'll see back after MRI has been completed

## 2011-08-26 NOTE — Progress Notes (Signed)
Addended by: Latricia Heft on: 08/26/2011 04:29 PM   Modules accepted: Orders

## 2011-08-26 NOTE — Patient Instructions (Signed)
Have MR of pelvis and return to see Dr. Hassell Done

## 2011-08-26 NOTE — Progress Notes (Signed)
Addended by: Latricia Heft on: 08/26/2011 04:21 PM   Modules accepted: Orders

## 2011-09-05 ENCOUNTER — Ambulatory Visit
Admission: RE | Admit: 2011-09-05 | Discharge: 2011-09-05 | Disposition: A | Discharge status: Home / Self Care | Payer: Medicare Other | Source: Ambulatory Visit | Attending: Surgery | Admitting: Surgery

## 2011-09-05 DIAGNOSIS — K6289 Other specified diseases of anus and rectum: Secondary | ICD-10-CM

## 2011-09-05 MED ORDER — GADOBENATE DIMEGLUMINE 529 MG/ML IV SOLN
15.0000 mL | Freq: Once | INTRAVENOUS | Status: AC | PRN
  Administered 2011-09-05: 15 mL via INTRAVENOUS

## 2011-09-16 ENCOUNTER — Ambulatory Visit (INDEPENDENT_AMBULATORY_CARE_PROVIDER_SITE_OTHER): Payer: Medicare Other | Admitting: Surgery

## 2011-10-19 ENCOUNTER — Encounter: Payer: Self-pay | Admitting: Family Medicine

## 2011-10-19 ENCOUNTER — Ambulatory Visit (INDEPENDENT_AMBULATORY_CARE_PROVIDER_SITE_OTHER): Payer: Medicare Other | Admitting: Family Medicine

## 2011-10-19 DIAGNOSIS — H5789 Other specified disorders of eye and adnexa: Secondary | ICD-10-CM

## 2011-10-19 DIAGNOSIS — J40 Bronchitis, not specified as acute or chronic: Secondary | ICD-10-CM | POA: Insufficient documentation

## 2011-10-19 DIAGNOSIS — H538 Other visual disturbances: Secondary | ICD-10-CM

## 2011-10-19 MED ORDER — AZITHROMYCIN 250 MG PO TABS: ORAL_TABLET | ORAL | Status: AC

## 2011-10-19 NOTE — Assessment & Plan Note (Addendum)
One enlarged/inflammed blood vessel lateral left bulbar conjunctiva. Treat with continuous lubricating eye drops. snellen ok

## 2011-10-19 NOTE — Assessment & Plan Note (Signed)
Given preceded by prior URTI infection, will cover for bacterial sinusitis with azithromycin. Lungs clear today, good O2 sat. Red flags to return discussed. Push fluids and rest, continue OTC remedies.

## 2011-10-19 NOTE — Progress Notes (Signed)
  Subjective:    Patient ID: Ernest Bell, male    DOB: 16-Dec-1945, 66 y.o.   MRN: [id number]  HPI CC: congestion  6d h/o chest congestion, coughing productive of clear/yellow mucous, ST.  Was sick for 10 days prior to this second illness.  (felt better for 4 days).  Also left eye staying red.  Recently changed glasses.  Mild blurry vision on left.  Snellen 20/40 R, L, B.  Feels sand gritty sensation left eye.  So far has tried visine, renew lubricating eye drops, OTC cough med, OTC alka seltzer cold tablets.  No fever, chills, abd pain, n/v, HA, ear pain or tooth pain.  No chest pain or SOB.  No sick contacts at home,  No smokers at home.  No h/o asthma, COPD.  H/o persistent afib s/p ablation, recently d/c flecainide and pradaxa by [facility].  Has f/u with them 12/2011.  Review of Systems Per HPI    Objective:   Physical Exam  Constitutional: He appears well-developed and well-nourished. No distress.  HENT:  Head: Normocephalic and atraumatic.  Right Ear: Hearing, tympanic membrane, external ear and ear canal normal.  Left Ear: Hearing, tympanic membrane, external ear and ear canal normal.  Nose: Nose normal. No mucosal edema or rhinorrhea. Right sinus exhibits no maxillary sinus tenderness and no frontal sinus tenderness. Left sinus exhibits no maxillary sinus tenderness and no frontal sinus tenderness.  Mouth/Throat: Uvula is midline, oropharynx is clear and moist and mucous membranes are normal. No oropharyngeal exudate, posterior oropharyngeal edema, posterior oropharyngeal erythema or tonsillar abscesses.  Eyes: EOM and lids are normal. Pupils are equal, round, and reactive to light. No foreign body present in the right eye. No foreign body present in the left eye. Right conjunctiva is not injected. Right conjunctiva has no hemorrhage. Left conjunctiva is injected. Left conjunctiva has no hemorrhage. No scleral icterus.         L bulbar conjunctival injection more on lateral side    Neck: Normal range of motion. Neck supple.  Cardiovascular: Normal rate, regular rhythm, normal heart sounds and intact distal pulses.   No murmur heard. Pulmonary/Chest: Effort normal and breath sounds normal. No respiratory distress. He has no wheezes. He has no rales.  Lymphadenopathy:    He has no cervical adenopathy.  Skin: Skin is warm and dry. No rash noted.      Assessment & Plan:

## 2011-10-19 NOTE — Patient Instructions (Addendum)
Sounds like you have developed bronchitis. Use medication as prescribed: zpack. Push fluids and plenty of rest. May use simple mucinex to break up and mobilize mjucous - drink plenty of water with this. For left eye -you do have larger blood vessel which may be irritating eye.  Continue regular use of lubricating eye drops and should improve with time.  Update me if worsening. Please return if you are not improving as expected, or if you have high fevers (>101.5) or difficulty swallowing or worsening productive cough. Call clinic with questions.  Good to see you today.

## 2011-11-14 ENCOUNTER — Telehealth: Payer: Self-pay | Admitting: Pulmonary Disease

## 2011-11-14 NOTE — Telephone Encounter (Signed)
Spoke to this company and told them they would need to speak with our medical records department since the pt is no longer being seen here at our office for his sleep problems--per the lady i spoke with the pt is not interested in using a cpap machine and she wanted a copy of the pt's old sleep study and again i advised pt to go through medical records and also told her she would need a signed release from the pt to proceed with this request  i also gave her the phone number to contact medical records directly

## 2011-12-12 ENCOUNTER — Encounter: Payer: Self-pay | Admitting: Pulmonary Disease

## 2011-12-12 ENCOUNTER — Ambulatory Visit (INDEPENDENT_AMBULATORY_CARE_PROVIDER_SITE_OTHER): Payer: Medicare Other | Admitting: Pulmonary Disease

## 2011-12-12 VITALS — BP 140/72 | HR 71 | Temp 97.8°F | Ht 67.0 in | Wt 167.2 lb

## 2011-12-12 DIAGNOSIS — G2581 Restless legs syndrome: Secondary | ICD-10-CM

## 2011-12-12 DIAGNOSIS — G4733 Obstructive sleep apnea (adult) (pediatric): Secondary | ICD-10-CM

## 2011-12-12 NOTE — Assessment & Plan Note (Signed)
The patient continues to have significant snoring, witnessed apneas during the night, and daytime sleepiness that did not improve with treatment for restless leg syndrome.  He would like to treat his known mild sleep apnea more aggressively, and I discussed with him a dental appliance versus CPAP.  He would like to start with CPAP and see how he responds. I will set the patient up on cpap at a moderate pressure level to allow for desensitization, and will troubleshoot the device over the next 4-6weeks if needed.  The pt is to call me if having issues with tolerance.  Will then optimize the pressure once patient is able to wear cpap on a consistent basis.

## 2011-12-12 NOTE — Assessment & Plan Note (Signed)
The patient currently is not on any dopamine agonist, and does not feel that his legs are bothering his sleep.

## 2011-12-12 NOTE — Patient Instructions (Signed)
Will start you on cpap as a trial.  Please call if having tolerance issues. followup with me in 5 weeks.

## 2011-12-12 NOTE — Progress Notes (Signed)
  Subjective:    Patient ID: Ernest Bell, male    DOB: 11-30-1945, 66 y.o.   MRN: [id number]  HPI The patient comes in today for reassessment of his obstructive sleep apnea.  He has not been seen since 2011, where he was found to have mild OSA and also the suggestion of RLS.  He was treated with a dopamine agonist, but did not see a big difference.  He has been off this medication, and doesn't feel that his legs are bothering him currently.   Review of Systems  Constitutional: Negative for fever and unexpected weight change.  HENT: Negative for ear pain, nosebleeds, congestion, sore throat, rhinorrhea, sneezing, trouble swallowing, dental problem, postnasal drip and sinus pressure.   Eyes: Negative for redness and itching.  Respiratory: Negative for cough, chest tightness, shortness of breath and wheezing.   Cardiovascular: Negative for palpitations and leg swelling.  Gastrointestinal: Negative for nausea and vomiting.  Genitourinary: Negative for dysuria.  Musculoskeletal: Negative for joint swelling.  Skin: Negative for rash.  Neurological: Negative for headaches.  Hematological: Does not bruise/bleed easily.  Psychiatric/Behavioral: Negative for dysphoric mood. The patient is not nervous/anxious.        Objective:   Physical Exam Well-developed male in no acute distress Nose without purulence or discharge noted Chest clear to auscultation Cardiac exam with regular rate and rhythm Lower extremities without edema, no cyanosis Awake, but appears sleepy, moves all 4 extremities.       Assessment & Plan:

## 2011-12-16 NOTE — Progress Notes (Signed)
Close per Dr.  Glori Bickers

## 2011-12-21 ENCOUNTER — Telehealth: Payer: Self-pay | Admitting: Pulmonary Disease

## 2011-12-21 NOTE — Telephone Encounter (Signed)
Pt was seen by [facility] on 12/12/11 and order was sent to [facility] to start pt up on cpap.  Pt calling today stating they still haven't received a call from [facility] regarding set up of cpap.  PCCs, please advise.  Thank you!!

## 2011-12-22 NOTE — Telephone Encounter (Signed)
Called and spoke with [place] at [facility]. She is checking on status of order and will return my call. Rhonda J Cobb

## 2011-12-22 NOTE — Telephone Encounter (Signed)
lmom for pt with rhonda's info, also stated we would forward to dr clance and await his response once he answers we will contact patient.

## 2011-12-22 NOTE — Telephone Encounter (Signed)
Spoke with [place]. Per Medicare guidelines, Medicare will not accept a sleep study older than one year. Patient will either have to repeat sleep study or home study. Please advise.

## 2011-12-23 NOTE — Telephone Encounter (Signed)
Ernest Bell, please review this with [place] Latino.  We need to stop this silly back and forth over rules that don't exist.

## 2011-12-23 NOTE — Telephone Encounter (Signed)
Since when?? That is ridiculous. Please contact another dme and ask them the same question.

## 2011-12-23 NOTE — Telephone Encounter (Signed)
Per [facility] there is no expiration date on sleep studies with Medicare. D/C order with [facility] and sent order to [facility] to provide s9 escape/auto with h/h at 8 cm, mask of choice.  Called and spoke with pt's wife and advised her that sleep study would not have to be repeated. According to [facility], they would be able to furnish CPAP off of the 2011 sleep study and that referral has been faxed to [facility]. Wife advised that [facility] should contact them today or Monday to arrange set up. If she had any additional concerns or problems to please give [place] a call.

## 2011-12-23 NOTE — Telephone Encounter (Signed)
[place] with [facility] and she will check with Billing and Reimbursement specialist and return my call.

## 2011-12-26 NOTE — Telephone Encounter (Signed)
Will sign off and print copy for [employer].

## 2012-01-24 ENCOUNTER — Ambulatory Visit: Payer: Medicare Other | Admitting: Pulmonary Disease

## 2012-01-25 ENCOUNTER — Ambulatory Visit: Payer: Medicare Other | Admitting: Pulmonary Disease

## 2012-04-19 ENCOUNTER — Other Ambulatory Visit (HOSPITAL_COMMUNITY): Payer: Self-pay | Admitting: Psychiatry

## 2012-04-19 DIAGNOSIS — F0390 Unspecified dementia without behavioral disturbance: Secondary | ICD-10-CM

## 2012-04-27 ENCOUNTER — Ambulatory Visit (HOSPITAL_COMMUNITY)
Admission: RE | Admit: 2012-04-27 | Discharge: 2012-04-27 | Disposition: A | Discharge status: Home / Self Care | Payer: Medicare Other | Source: Ambulatory Visit | Attending: Psychiatry | Admitting: Psychiatry

## 2012-04-27 DIAGNOSIS — G319 Degenerative disease of nervous system, unspecified: Secondary | ICD-10-CM | POA: Insufficient documentation

## 2012-04-27 DIAGNOSIS — G939 Disorder of brain, unspecified: Secondary | ICD-10-CM | POA: Insufficient documentation

## 2012-04-27 DIAGNOSIS — F0390 Unspecified dementia without behavioral disturbance: Secondary | ICD-10-CM | POA: Insufficient documentation

## 2012-05-21 ENCOUNTER — Encounter: Payer: Self-pay | Admitting: Internal Medicine

## 2012-05-21 ENCOUNTER — Ambulatory Visit (INDEPENDENT_AMBULATORY_CARE_PROVIDER_SITE_OTHER): Payer: Medicare Other | Admitting: Internal Medicine

## 2012-05-21 VITALS — BP 94/50 | HR 72 | Ht 67.0 in | Wt 169.0 lb

## 2012-05-21 DIAGNOSIS — E785 Hyperlipidemia, unspecified: Secondary | ICD-10-CM

## 2012-05-21 DIAGNOSIS — I4891 Unspecified atrial fibrillation: Secondary | ICD-10-CM

## 2012-05-21 DIAGNOSIS — I311 Chronic constrictive pericarditis: Secondary | ICD-10-CM

## 2012-05-21 NOTE — Patient Instructions (Addendum)
Your physician wants you to follow-up in: 12 months with Dr Jacquiline Doe will receive a reminder letter in the mail two months in advance. If you don't receive a letter, please call our office to schedule the follow-up appointment.   Labs on Thurs in [place]

## 2012-05-21 NOTE — Progress Notes (Signed)
PCP: Ria Bush, MD  Ernest Bell is a 66 y.o. male who presents today for routine electrophysiology followup.  Since last being seen in our clinic, the patient reports doing very well.  Today, he denies symptoms of palpitations, chest pain, shortness of breath,  lower extremity edema, dizziness, presyncope, or syncope.  The patient is otherwise without complaint today.   Past Medical History  Diagnosis Date  . Persistent atrial fibrillation 09/15/10    S/P PVI (Cryo) at [facility]  . Atrial fib/flutter, transient 04/28/10    typical appearing atrial flutter dx 04/28/10 s/p CTI ablation 10/11  . 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 Depression   . DJD (degenerative joint disease)   . BPH (benign prostatic hyperplasia)   . Liver cirrhosis     ? at time of cholecystectomy  . Syncope 1997    recurrent S/P tilt study by Dr. [name] (like due to dysautonomia)  . Gout    Past Surgical History  Procedure Date  . Pericardioectomy 1969    at [facility]  . Knee surgery 2007    he had osteoarthritis with left knee surgery  . Hemorroidectomy 2007  . Cholecystectomy 1995  . Right thumb surger     for osteoarthritis  . Afib abation 09/15/10    Cryo at [place], CTI ablation at [facility] 04/28/10    Current Outpatient Prescriptions  Medication Sig Dispense Refill  . aspirin 81 MG tablet Take 81 mg by mouth daily.      Marland Kitchen buPROPion (WELLBUTRIN XL) 150 MG 24 hr tablet Take 300 mg by mouth daily.       . sertraline (ZOLOFT) 100 MG tablet Take 100 mg by mouth daily.        . Tamsulosin HCl (FLOMAX) 0.4 MG CAPS Take by mouth 2 (two) times daily.          Physical Exam: Filed Vitals:   05/21/12 1412  BP: 94/50  Pulse: 72  Height: 5' 7"  (1.702 m)  Weight: 169 lb (76.658 kg)  SpO2: 97%    GEN- The patient is well appearing, alert and oriented x 3 today.   Head- normocephalic, atraumatic Eyes-  Sclera clear, conjunctiva pink Ears- hearing  intact Oropharynx- clear Lungs- Clear to ausculation bilaterally, normal work of breathing Heart- Regular rate and rhythm, no murmurs, rubs or gallops, PMI not laterally displaced GI- soft, NT, ND, + BS Extremities- no clubbing, cyanosis, or edema  ekg today reveals sinus rhythm 72 bpm, PR 172, QRS 74, Qtc 422, otherwise normal ekg  Assessment and Plan:  1. Atrial fibrillation Doing well s/p ablation at [facility] Continue ASA CHADS2 score is 0  2. Prior constrictive pericarditis Asymptomatic  3. HL Repeat fasting lipids  Return in 1year

## 2012-05-24 ENCOUNTER — Other Ambulatory Visit: Payer: Medicare Other

## 2012-05-24 DIAGNOSIS — E785 Hyperlipidemia, unspecified: Secondary | ICD-10-CM

## 2012-05-25 LAB — CHOLESTEROL, TOTAL: Cholesterol, Total: 205 mg/dL — ABNORMAL HIGH (ref 100–199)

## 2012-06-06 ENCOUNTER — Ambulatory Visit: Payer: Medicare Other | Admitting: Pulmonary Disease

## 2012-06-08 ENCOUNTER — Encounter: Payer: Self-pay | Admitting: Pulmonary Disease

## 2012-06-08 ENCOUNTER — Ambulatory Visit (INDEPENDENT_AMBULATORY_CARE_PROVIDER_SITE_OTHER): Payer: Medicare Other | Admitting: Pulmonary Disease

## 2012-06-08 VITALS — BP 110/72 | HR 68 | Temp 97.7°F | Ht 67.0 in | Wt 167.2 lb

## 2012-06-08 DIAGNOSIS — G2581 Restless legs syndrome: Secondary | ICD-10-CM

## 2012-06-08 DIAGNOSIS — G4733 Obstructive sleep apnea (adult) (pediatric): Secondary | ICD-10-CM

## 2012-06-08 NOTE — Patient Instructions (Addendum)
Will optimize your pressure at home on the auto setting.  Will let you know the results. followup with me in 12 mos, but call if you are having issues with your cpap.

## 2012-06-08 NOTE — Progress Notes (Signed)
  Subjective:    Patient ID: Ernest Bell, male    DOB: 09/07/1945, 66 y.o.   MRN: [id number]  HPI The patient comes in today for followup of his obstructive sleep apnea.  He was started on CPAP in the spring, but failed to followup at the 6 week mark.  He comes in today where he feels he is doing well with CPAP, and denies any mask or pressure issues.  He feels that he is sleeping better, and has improved daytime alertness.  He also feels that his leg movements were noted longer an issue for him.   Review of Systems  Constitutional: Negative for fever and unexpected weight change.  HENT: Negative for ear pain, nosebleeds, congestion, sore throat, rhinorrhea, sneezing, trouble swallowing, dental problem, postnasal drip and sinus pressure.   Eyes: Negative for redness and itching.  Respiratory: Negative for cough, chest tightness, shortness of breath and wheezing.   Cardiovascular: Negative for palpitations and leg swelling.  Gastrointestinal: Negative for nausea and vomiting.  Genitourinary: Negative for dysuria.  Musculoskeletal: Negative for joint swelling.  Skin: Negative for rash.  Neurological: Negative for headaches.  Hematological: Does not bruise/bleed easily.  Psychiatric/Behavioral: Positive for dysphoric mood. The patient is nervous/anxious.        Objective:   Physical Exam Well-developed male in no acute distress No skin breakdown or pressure necrosis from the CPAP mask Lower extremities without edema, no cyanosis Alert and oriented, moves all 4 extremities.       Assessment & Plan:

## 2012-06-08 NOTE — Assessment & Plan Note (Signed)
The patient seems to be doing well on CPAP, but I have reminded him that we need to optimize his pressure.  I have also encouraged him to keep up with his mask changes and supplies.  I will call him with the results of his auto titration, and he will followup with me formally in one year.

## 2012-07-29 ENCOUNTER — Other Ambulatory Visit: Payer: Self-pay | Admitting: Pulmonary Disease

## 2012-07-29 DIAGNOSIS — G4733 Obstructive sleep apnea (adult) (pediatric): Secondary | ICD-10-CM

## 2012-09-06 ENCOUNTER — Telehealth: Payer: Self-pay | Admitting: Internal Medicine

## 2012-09-06 NOTE — Telephone Encounter (Signed)
Spoke to patients wife, she states Ernest Bell has been in and out rhythm for about 3 weeks.  If he is real active he states he feels the pounding  and at night he states he feels the irregular beats.  It feels irregular but not  fast.  C/o fatigue mostly.  He was last seen in 05/2012 and at that point we were going to have him follow up in a year.  He had stopped his Metoprolol on his on at that visit and was kept off per him as he was doing so well.   Will forward to Dr Johney Frame for review and advice as to how quickly patient needs to be seen.

## 2012-09-06 NOTE — Telephone Encounter (Signed)
Pt's wife calling re pt being in a-fib well over a week, wants to see allred, only in 1 day next week and booked, pls call [phone]

## 2012-09-17 NOTE — Telephone Encounter (Signed)
Please schedule follow-up with me at next available time.

## 2012-09-19 ENCOUNTER — Telehealth: Payer: Self-pay | Admitting: Physician Assistant

## 2012-09-19 ENCOUNTER — Ambulatory Visit (INDEPENDENT_AMBULATORY_CARE_PROVIDER_SITE_OTHER): Payer: Medicare Other | Admitting: Internal Medicine

## 2012-09-19 ENCOUNTER — Encounter: Payer: Self-pay | Admitting: Internal Medicine

## 2012-09-19 VITALS — BP 136/89 | HR 55 | Wt 162.6 lb

## 2012-09-19 DIAGNOSIS — I4891 Unspecified atrial fibrillation: Secondary | ICD-10-CM

## 2012-09-19 DIAGNOSIS — I311 Chronic constrictive pericarditis: Secondary | ICD-10-CM

## 2012-09-19 DIAGNOSIS — R002 Palpitations: Secondary | ICD-10-CM

## 2012-09-19 MED ORDER — VERAPAMIL HCL ER 180 MG PO TBCR: 180.0000 mg | EXTENDED_RELEASE_TABLET | Freq: Every day | ORAL | Status: DC

## 2012-09-19 NOTE — Telephone Encounter (Signed)
Entered in error. Message left at office.

## 2012-09-19 NOTE — Patient Instructions (Addendum)
Your physician recommends that you schedule a follow-up appointment in: 6 weeks with Dr Johney Frame  Your physician has requested that you have an echocardiogram. Echocardiography is a painless test that uses sound waves to create images of your heart. It provides your doctor with information about the size and shape of your heart and how well your heart's chambers and valves are working. This procedure takes approximately one hour. There are no restrictions for this procedure.  Your physician has recommended that you wear an event monitor. Event monitors are medical devices that record the heart's electrical activity. Doctors most often [place] these monitors to diagnose arrhythmias. Arrhythmias are problems with the speed or rhythm of the heartbeat. The monitor is a small, portable device. You can wear one while you do your normal daily activities. This is usually used to diagnose what is causing palpitations/syncope (passing out).--4 week [employer]   Your physician has recommended you make the following change in your medication:  1) Verapamil 180mg  daily

## 2012-09-29 ENCOUNTER — Encounter: Payer: Self-pay | Admitting: Internal Medicine

## 2012-09-29 NOTE — Assessment & Plan Note (Signed)
Asymptomatic Echo  (as above)

## 2012-09-29 NOTE — Assessment & Plan Note (Signed)
Ddx includes recurrence of afib, atrial flutter/ atrial tach, pacs, and pvcs. Will place an event monitor to better characterize Restart verapamil and follow

## 2012-09-29 NOTE — Assessment & Plan Note (Signed)
As above CHADS2 score is 0.  Continue ASA Obtain an echo to evaluate for any structural changes and to evaluate the LA size

## 2012-09-29 NOTE — Progress Notes (Signed)
PCP:  Ria Bush, MD  The patient presents today for routine electrophysiology followup.  Since last being seen in our clinic, the patient reports doing reasonably well.  Over the past few months, he has noticed increased palpitations.  These appear to be mostly short lived, lasting several seconds but are occasionally longer.  He is not certain as to whether this feels like his prior afib.  His exercise tolerance remains good.   Today, he denies symptoms of chest pain, shortness of breath, orthopnea, PND, lower extremity edema, dizziness, presyncope, syncope, or neurologic sequela.  The patient feels that he is tolerating medications without difficulties and is otherwise without complaint today.   Past Medical History  Diagnosis Date  . Persistent atrial fibrillation 09/15/10    S/P PVI (Cryo) at [facility]  . Atrial fib/flutter, transient 04/28/10    typical appearing atrial flutter dx 04/28/10 s/p CTI ablation 10/11  . 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 Depression   . DJD (degenerative joint disease)   . BPH (benign prostatic hyperplasia)   . Liver cirrhosis     ? at time of cholecystectomy  . Syncope 1997    recurrent S/P tilt study by Dr. [name] (like due to dysautonomia)  . Gout    Past Surgical History  Procedure Date  . Pericardioectomy 1969    at [facility]  . Knee surgery 2007    he had osteoarthritis with left knee surgery  . Hemorroidectomy 2007  . Cholecystectomy 1995  . Right thumb surger     for osteoarthritis  . Afib abation 09/15/10    Cryo at [place], CTI ablation at [facility] 04/28/10    Current Outpatient Prescriptions  Medication Sig Dispense Refill  . aspirin 81 MG tablet Take 81 mg by mouth daily.      Marland Kitchen buPROPion (WELLBUTRIN XL) 150 MG 24 hr tablet Take 300 mg by mouth daily.       . sertraline (ZOLOFT) 100 MG tablet Take 100 mg by mouth daily.        . Tamsulosin HCl (FLOMAX) 0.4 MG CAPS Take by mouth  2 (two) times daily.        Marland Kitchen zolpidem (AMBIEN) 10 MG tablet at bedtime as needed.       . temazepam (RESTORIL) 15 MG capsule at bedtime as needed.       . verapamil (CALAN-SR) 180 MG CR tablet Take 1 tablet (180 mg total) by mouth at bedtime.  90 tablet  3    Allergies  Allergen Reactions  . Penicillins     REACTION: Rash    History   Social History  . Marital Status: Married    Spouse Name: N/A    Number of Children: N/A  . Years of Education: N/A   Occupational History  . Disabled Education officer, museum     previously worked as a Higher education careers adviser   Social History Main Topics  . Smoking status: Never Smoker   . Smokeless tobacco: Not on file  . Alcohol Use: No  . Drug Use: No  . Sexually Active: Not on file   Other Topics Concern  . Not on file   Social History Narrative   Patient lives in [place] with spouse. He enjoys walking on a regalar basis.    Family History  Problem Relation Age of Onset  . Hypertension Mother   . Alcohol abuse Father     smoker  . Heart disease  Father     MI (smoker)    ROS-  All systems are reviewed and are negative except as outlined in the HPI above   Physical Exam: Filed Vitals:   09/19/12 1610  BP: 136/89  Pulse: 55  Weight: 162 lb 9.6 oz (73.755 kg)    GEN- The patient is well appearing, alert and oriented x 3 today.   Head- normocephalic, atraumatic Eyes-  Sclera clear, conjunctiva pink Ears- hearing intact Oropharynx- clear Neck- supple, no JVP Lymph- no cervical lymphadenopathy Lungs- Clear to ausculation bilaterally, normal work of breathing Heart- Regular rate and rhythm, no murmurs, rubs or gallops, PMI not laterally displaced GI- soft, NT, ND, + BS Extremities- no clubbing, cyanosis, or edema MS- no significant deformity or atrophy Skin- no rash or lesion Psych- euthymic mood, full affect Neuro- strength and sensation are intact  ekg today reveals sinus rhythm 80 bpm, PR 148, QRc 442, nonspecific St/T  changes  Assessment and Plan:

## 2012-10-01 ENCOUNTER — Ambulatory Visit (HOSPITAL_COMMUNITY): Payer: Medicare Other | Attending: Cardiovascular Disease | Admitting: Radiology

## 2012-10-01 ENCOUNTER — Encounter (INDEPENDENT_AMBULATORY_CARE_PROVIDER_SITE_OTHER): Payer: Medicare Other

## 2012-10-01 ENCOUNTER — Telehealth: Payer: Self-pay | Admitting: *Deleted

## 2012-10-01 DIAGNOSIS — I059 Rheumatic mitral valve disease, unspecified: Secondary | ICD-10-CM | POA: Insufficient documentation

## 2012-10-01 DIAGNOSIS — R002 Palpitations: Secondary | ICD-10-CM

## 2012-10-01 DIAGNOSIS — M1990 Unspecified osteoarthritis, unspecified site: Secondary | ICD-10-CM | POA: Insufficient documentation

## 2012-10-01 DIAGNOSIS — I319 Disease of pericardium, unspecified: Secondary | ICD-10-CM | POA: Insufficient documentation

## 2012-10-01 DIAGNOSIS — G4733 Obstructive sleep apnea (adult) (pediatric): Secondary | ICD-10-CM | POA: Insufficient documentation

## 2012-10-01 DIAGNOSIS — I4891 Unspecified atrial fibrillation: Secondary | ICD-10-CM

## 2012-10-01 DIAGNOSIS — I4892 Unspecified atrial flutter: Secondary | ICD-10-CM | POA: Insufficient documentation

## 2012-10-01 DIAGNOSIS — R55 Syncope and collapse: Secondary | ICD-10-CM | POA: Insufficient documentation

## 2012-10-01 NOTE — Addendum Note (Signed)
Addended by: Janan Halter F on: 10/01/2012 02:01 PM   Modules accepted: Orders

## 2012-10-01 NOTE — Telephone Encounter (Signed)
30 day Event monitor placed 10/01/12  TK

## 2012-10-01 NOTE — Progress Notes (Signed)
Echocardiogram performed.  

## 2012-10-04 ENCOUNTER — Other Ambulatory Visit: Payer: Self-pay | Admitting: *Deleted

## 2012-10-04 DIAGNOSIS — I4891 Unspecified atrial fibrillation: Secondary | ICD-10-CM

## 2012-10-04 DIAGNOSIS — R002 Palpitations: Secondary | ICD-10-CM

## 2012-10-25 ENCOUNTER — Telehealth: Payer: Self-pay | Admitting: Internal Medicine

## 2012-10-25 NOTE — Telephone Encounter (Signed)
Spoke with pt's wife, she understands we were in clinic earlier is why we could not call back before. She is aware of the results

## 2012-10-25 NOTE — Telephone Encounter (Signed)
New Problem:    Patient's wife called in wanting to know what the results of her husband's ECHO and Monitor.  Please call back.

## 2012-10-25 NOTE — Telephone Encounter (Signed)
Follow-up:    Patient's wife called in again to follow-up on her initial call.  Please call back.

## 2012-10-31 ENCOUNTER — Encounter: Payer: Self-pay | Admitting: Internal Medicine

## 2012-10-31 ENCOUNTER — Ambulatory Visit (INDEPENDENT_AMBULATORY_CARE_PROVIDER_SITE_OTHER): Payer: Medicare Other | Admitting: Internal Medicine

## 2012-10-31 VITALS — BP 118/72 | HR 58 | Wt 167.8 lb

## 2012-10-31 DIAGNOSIS — I4891 Unspecified atrial fibrillation: Secondary | ICD-10-CM

## 2012-10-31 DIAGNOSIS — R002 Palpitations: Secondary | ICD-10-CM

## 2012-10-31 MED ORDER — FLECAINIDE ACETATE 50 MG PO TABS: 50.0000 mg | ORAL_TABLET | Freq: Two times a day (BID) | ORAL | Status: DC

## 2012-10-31 NOTE — Patient Instructions (Addendum)
Your physician recommends that you schedule a follow-up appointment in: 2 months with Dr Johney Frame  Your physician has recommended you make the following change in your medication:  1) Start Flecainid 50 mg twice daily

## 2012-11-05 NOTE — Progress Notes (Signed)
PCP:  Ria Bush, MD  The patient presents today for routine electrophysiology followup.  Since last being seen in our clinic, the patient reports doing reasonably well.  His exercise tolerance remains good.  He has occasional palpitations.  Event monitor documented frequent PACs but no prolonged afib.   Today, he denies symptoms of chest pain, shortness of breath, orthopnea, PND, lower extremity edema, dizziness, presyncope, syncope, or neurologic sequela.  The patient feels that he is tolerating medications without difficulties and is otherwise without complaint today.   Past Medical History  Diagnosis Date  . Persistent atrial fibrillation 09/15/10    S/P PVI (Cryo) at [facility]  . Atrial fib/flutter, transient 04/28/10    typical appearing atrial flutter dx 04/28/10 s/p CTI ablation 10/11  . 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 Depression   . DJD (degenerative joint disease)   . BPH (benign prostatic hyperplasia)   . Liver cirrhosis     ? at time of cholecystectomy  . Syncope 1997    recurrent S/P tilt study by Dr. [name] (like due to dysautonomia)  . Gout    Past Surgical History  Procedure Laterality Date  . Pericardioectomy  1969    at [facility]  . Knee surgery  2007    he had osteoarthritis with left knee surgery  . Hemorroidectomy  2007  . Cholecystectomy  1995  . Right thumb surger      for osteoarthritis  . Afib abation  09/15/10    Cryo at [place], CTI ablation at [facility] 04/28/10    Current Outpatient Prescriptions  Medication Sig Dispense Refill  . aspirin 81 MG tablet Take 81 mg by mouth daily.      Marland Kitchen buPROPion (WELLBUTRIN XL) 150 MG 24 hr tablet Take 300 mg by mouth daily.       . sertraline (ZOLOFT) 100 MG tablet Take 100 mg by mouth daily.        . Tamsulosin HCl (FLOMAX) 0.4 MG CAPS Take by mouth 2 (two) times daily.        . temazepam (RESTORIL) 15 MG capsule at bedtime as needed.       . verapamil  (CALAN-SR) 180 MG CR tablet Take 1 tablet (180 mg total) by mouth at bedtime.  90 tablet  3  . zolpidem (AMBIEN) 10 MG tablet at bedtime as needed.       . flecainide (TAMBOCOR) 50 MG tablet Take 1 tablet (50 mg total) by mouth 2 (two) times daily.  180 tablet  3   No current facility-administered medications for this visit.    Allergies  Allergen Reactions  . Penicillins     REACTION: Rash    History   Social History  . Marital Status: Married    Spouse Name: N/A    Number of Children: N/A  . Years of Education: N/A   Occupational History  . Disabled Education officer, museum     previously worked as a Higher education careers adviser   Social History Main Topics  . Smoking status: Never Smoker   . Smokeless tobacco: Not on file  . Alcohol Use: No  . Drug Use: No  . Sexually Active: Not on file   Other Topics Concern  . Not on file   Social History Narrative   Patient lives in [place] with spouse. He enjoys walking on a regalar basis.    Family History  Problem Relation Age of Onset  . Hypertension Mother   .  Alcohol abuse Father     smoker  . Heart disease Father     MI (smoker)    ROS-  All systems are reviewed and are negative except as outlined in the HPI above   Physical Exam: Filed Vitals:   10/31/12 1517  BP: 118/72  Pulse: 58  Weight: 167 lb 12.8 oz (76.114 kg)    GEN- The patient is well appearing, alert and oriented x 3 today.   Head- normocephalic, atraumatic Eyes-  Sclera clear, conjunctiva pink Ears- hearing intact Oropharynx- clear Neck- supple, no JVP Lymph- no cervical lymphadenopathy Lungs- Clear to ausculation bilaterally, normal work of breathing Heart- Regular rate and rhythm, no murmurs, rubs or gallops, PMI not laterally displaced GI- soft, NT, ND, + BS Extremities- no clubbing, cyanosis, or edema MS- no significant deformity or atrophy Skin- no rash or lesion Psych- euthymic mood, full affect Neuro- strength and sensation are intact  Event monitor  is reviewed in detail  Assessment and Plan:

## 2012-11-05 NOTE — Assessment & Plan Note (Addendum)
Doing well without recurrence Event monitor reveals frequent PACs but no sustained afib Given very frequent and symptomatic ectopy, will start flecainide 77m BID at this time

## 2012-11-05 NOTE — Assessment & Plan Note (Signed)
As above.

## 2013-01-02 ENCOUNTER — Ambulatory Visit: Payer: Medicare Other | Admitting: Internal Medicine

## 2013-01-14 ENCOUNTER — Ambulatory Visit: Payer: Medicare Other | Admitting: Internal Medicine

## 2013-02-11 ENCOUNTER — Ambulatory Visit: Payer: Medicare Other | Admitting: Internal Medicine

## 2013-02-21 ENCOUNTER — Ambulatory Visit: Payer: Medicare Other | Admitting: Internal Medicine

## 2013-03-25 ENCOUNTER — Emergency Department (HOSPITAL_COMMUNITY): Payer: Medicare Other

## 2013-03-25 ENCOUNTER — Encounter (HOSPITAL_COMMUNITY): Payer: Self-pay

## 2013-03-25 ENCOUNTER — Emergency Department (HOSPITAL_COMMUNITY)
Admission: EM | Admit: 2013-03-25 | Discharge: 2013-03-25 | Disposition: A | Discharge status: Home / Self Care | Payer: Medicare Other | Attending: Emergency Medicine | Admitting: Emergency Medicine

## 2013-03-25 DIAGNOSIS — S2220XA Unspecified fracture of sternum, initial encounter for closed fracture: Secondary | ICD-10-CM | POA: Insufficient documentation

## 2013-03-25 DIAGNOSIS — Z79899 Other long term (current) drug therapy: Secondary | ICD-10-CM | POA: Insufficient documentation

## 2013-03-25 DIAGNOSIS — Z23 Encounter for immunization: Secondary | ICD-10-CM | POA: Insufficient documentation

## 2013-03-25 DIAGNOSIS — Y9389 Activity, other specified: Secondary | ICD-10-CM | POA: Insufficient documentation

## 2013-03-25 DIAGNOSIS — F329 Major depressive disorder, single episode, unspecified: Secondary | ICD-10-CM | POA: Insufficient documentation

## 2013-03-25 DIAGNOSIS — S0990XA Unspecified injury of head, initial encounter: Secondary | ICD-10-CM | POA: Insufficient documentation

## 2013-03-25 DIAGNOSIS — F3289 Other specified depressive episodes: Secondary | ICD-10-CM | POA: Insufficient documentation

## 2013-03-25 DIAGNOSIS — S0001XA Abrasion of scalp, initial encounter: Secondary | ICD-10-CM

## 2013-03-25 DIAGNOSIS — Z8719 Personal history of other diseases of the digestive system: Secondary | ICD-10-CM | POA: Insufficient documentation

## 2013-03-25 DIAGNOSIS — N400 Enlarged prostate without lower urinary tract symptoms: Secondary | ICD-10-CM | POA: Insufficient documentation

## 2013-03-25 DIAGNOSIS — S298XXA Other specified injuries of thorax, initial encounter: Secondary | ICD-10-CM | POA: Insufficient documentation

## 2013-03-25 DIAGNOSIS — S0003XA Contusion of scalp, initial encounter: Secondary | ICD-10-CM | POA: Insufficient documentation

## 2013-03-25 DIAGNOSIS — R0789 Other chest pain: Secondary | ICD-10-CM

## 2013-03-25 DIAGNOSIS — Y929 Unspecified place or not applicable: Secondary | ICD-10-CM | POA: Insufficient documentation

## 2013-03-25 DIAGNOSIS — Z880 Allergy status to penicillin: Secondary | ICD-10-CM | POA: Insufficient documentation

## 2013-03-25 DIAGNOSIS — Z8739 Personal history of other diseases of the musculoskeletal system and connective tissue: Secondary | ICD-10-CM | POA: Insufficient documentation

## 2013-03-25 DIAGNOSIS — W010XXA Fall on same level from slipping, tripping and stumbling without subsequent striking against object, initial encounter: Secondary | ICD-10-CM | POA: Insufficient documentation

## 2013-03-25 DIAGNOSIS — Z8679 Personal history of other diseases of the circulatory system: Secondary | ICD-10-CM | POA: Insufficient documentation

## 2013-03-25 LAB — BASIC METABOLIC PANEL
BUN: 28 mg/dL — ABNORMAL HIGH (ref 6–23)
CO2: 26 mEq/L (ref 19–32)
Calcium: 9.8 mg/dL (ref 8.4–10.5)
Chloride: 101 mEq/L (ref 96–112)
Creatinine, Ser: 1 mg/dL (ref 0.50–1.35)
GFR calc Af Amer: 89 mL/min — ABNORMAL LOW (ref >90)
GFR calc non Af Amer: 76 mL/min — ABNORMAL LOW (ref >90)
Glucose, Bld: 105 mg/dL — ABNORMAL HIGH (ref 70–99)
Potassium: 4.1 mEq/L (ref 3.5–5.1)
Sodium: 137 mEq/L (ref 135–145)

## 2013-03-25 LAB — CBC
HCT: 42.2 % (ref 39.0–52.0)
Hemoglobin: 15.4 g/dL (ref 13.0–17.0)
MCH: 31.6 pg (ref 26.0–34.0)
MCHC: 36.5 g/dL — ABNORMAL HIGH (ref 30.0–36.0)
MCV: 86.5 fL (ref 78.0–100.0)
Platelets: 178 10*3/uL (ref 150–400)
RBC: 4.88 MIL/uL (ref 4.22–5.81)
RDW: 12.8 % (ref 11.5–15.5)
WBC: 16.4 10*3/uL — ABNORMAL HIGH (ref 4.0–10.5)

## 2013-03-25 MED ORDER — ACETAMINOPHEN 325 MG PO TABS: 650.0000 mg | ORAL_TABLET | Freq: Once | ORAL | Status: DC

## 2013-03-25 MED ORDER — TETANUS-DIPHTHERIA TOXOIDS TD 5-2 LFU IM INJ
INJECTION | INTRAMUSCULAR | Status: AC
  Administered 2013-03-25: 0.5 mL via INTRAMUSCULAR
  Filled 2013-03-25: qty 0.5

## 2013-03-25 MED ORDER — BACITRACIN-NEOMYCIN-POLYMYXIN 400-5-5000 EX OINT
TOPICAL_OINTMENT | Freq: Once | CUTANEOUS | Status: AC
  Administered 2013-03-25: 17:00:00 via TOPICAL
  Filled 2013-03-25: qty 2

## 2013-03-25 MED ORDER — HYDROCODONE-ACETAMINOPHEN 5-325 MG PO TABS: 1.0000 | ORAL_TABLET | ORAL | Status: DC | PRN

## 2013-03-25 MED ORDER — ACETAMINOPHEN 325 MG PO TABS
ORAL_TABLET | ORAL | Status: AC
  Administered 2013-03-25: 19:00:00
  Filled 2013-03-25: qty 2

## 2013-03-25 MED ORDER — TETANUS-DIPHTH-ACELL PERTUSSIS 5-2.5-18.5 LF-MCG/0.5 IM SUSP: 0.5000 mL | Freq: Once | INTRAMUSCULAR | Status: DC

## 2013-03-25 NOTE — ED Provider Notes (Signed)
History    CSN: [id number] Arrival date & time 03/25/13  1442  First MD Initiated Contact with Patient 03/25/13 1514     Chief Complaint  Patient presents with  . Fall   (Consider location/radiation/quality/duration/timing/severity/associated sxs/prior Treatment) HPI Comments: Ernest Bell is a 67 y.o. Male presenting with chest and left shoulder pain after tumbling off a deck.  He was working to help tear down a deck and was prying up boards when he slipped and somersaulted off the deck from approximately 5 feet.  He landed on his left shoulder, but has minimal pain here,  Instead he has increased pain and swelling at his mid sternum.  He describes pressure but also sharp pain with palpation and deep inspiration.  He also grazed the top of his scalp, he believes against a screen, causing abrasion,  He denies loc and denies a direct blow to his head.  He is unsure of the date of his last tetanus.  He is not on any blood thinners.     The history is provided by the patient.   Past Medical History  Diagnosis Date  . Persistent atrial fibrillation 09/15/10    S/P PVI (Cryo) at [facility]  . Atrial fib/flutter, transient 04/28/10    typical appearing atrial flutter dx 04/28/10 s/p CTI ablation 10/11  . 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 Depression   . DJD (degenerative joint disease)   . BPH (benign prostatic hyperplasia)   . Liver cirrhosis     ? at time of cholecystectomy  . Syncope 1997    recurrent S/P tilt study by Dr. Graciela Husbands (like due to dysautonomia)  . Gout    Past Surgical History  Procedure Laterality Date  . Pericardioectomy  1969    at [facility]  . Knee surgery  2007    he had osteoarthritis with left knee surgery  . Hemorroidectomy  2007  . Cholecystectomy  1995  . Right thumb surger      for osteoarthritis  . Afib abation  09/15/10    Cryo at [facility], CTI ablation at [facility] 04/28/10   Family History  Problem  Relation Age of Onset  . Hypertension Mother   . Alcohol abuse Father     smoker  . Heart disease Father     MI (smoker)   History  Substance Use Topics  . Smoking status: Never Smoker   . Smokeless tobacco: Not on file  . Alcohol Use: No    Review of Systems  Constitutional: Negative for fever.  HENT: Negative for congestion, sore throat, facial swelling and neck pain.   Eyes: Negative.   Respiratory: Positive for chest tightness. Negative for shortness of breath.   Cardiovascular: Positive for chest pain.  Gastrointestinal: Negative for nausea and abdominal pain.  Genitourinary: Negative.   Musculoskeletal: Positive for arthralgias. Negative for joint swelling.  Skin: Positive for wound. Negative for rash.  Neurological: Negative for dizziness, weakness, light-headedness, numbness and headaches.  Psychiatric/Behavioral: Negative.     Allergies  Penicillins  Home Medications   Current Outpatient Rx  Name  Route  Sig  Dispense  Refill  . buPROPion (WELLBUTRIN XL) 150 MG 24 hr tablet   Oral   Take 300 mg by mouth every evening.          . naproxen sodium (ALEVE) 220 MG tablet   Oral   Take 220 mg by mouth every morning.         Marland Kitchen  sertraline (ZOLOFT) 100 MG tablet   Oral   Take 100 mg by mouth daily.           . Tamsulosin HCl (FLOMAX) 0.4 MG CAPS   Oral   Take 0.4 mg by mouth 2 (two) times daily.          . temazepam (RESTORIL) 15 MG capsule   Oral   Take 15 mg by mouth at bedtime as needed for sleep.          . verapamil (CALAN-SR) 180 MG CR tablet   Oral   Take 1 tablet (180 mg total) by mouth at bedtime.   90 tablet   3   . zolpidem (AMBIEN) 10 MG tablet   Oral   Take 10 mg by mouth at bedtime as needed for sleep.          Marland Kitchen HYDROcodone-acetaminophen (NORCO/VICODIN) 5-325 MG per tablet   Oral   Take 1 tablet by mouth every 4 (four) hours as needed for pain.   20 tablet   0    BP 134/76  Pulse 72  Temp(Src) 97.8 F (36.6 C)  (Oral)  Resp 18  Ht 5\' 7"  (1.702 m)  Wt 168 lb (76.204 kg)  BMI 26.31 kg/m2  SpO2 99% Physical Exam  Nursing note and vitals reviewed. Constitutional: He appears well-developed and well-nourished.  HENT:  Head: Normocephalic. Head is with abrasion. Head is without raccoon's eyes and without Battle's sign.  Right Ear: Tympanic membrane normal. No hemotympanum.  Left Ear: Tympanic membrane normal. No hemotympanum.  Abrasion to parietal scalp.  No hematoma.  Eyes: EOM are normal. Pupils are equal, round, and reactive to light.  Neck: Normal range of motion. Neck supple.  No midline pain.  Cardiovascular: Normal rate, regular rhythm, normal heart sounds and intact distal pulses.   Pulmonary/Chest: Effort normal and breath sounds normal. He has no wheezes. He exhibits tenderness.    Hematoma upper left sternal border.  Abdominal: Soft. Bowel sounds are normal. There is no tenderness.  Musculoskeletal: Normal range of motion.       Left shoulder: He exhibits bony tenderness. He exhibits normal range of motion, no swelling, no crepitus, no deformity and normal pulse.  Neurological: He is alert.  Skin: Skin is warm and dry.  Psychiatric: He has a normal mood and affect.    ED Course  Procedures (including critical care time)    Date: 03/25/2013  Rate: 65  Rhythm: normal sinus rhythm  QRS Axis: normal  Intervals: normal  ST/T Wave abnormalities: normal  Conduction Disutrbances:none  Narrative Interpretation:   Old EKG Reviewed: unchanged   Labs Reviewed  CBC - Abnormal; Notable for the following:    WBC 16.4 (*)    MCHC 36.5 (*)    All other components within normal limits  BASIC METABOLIC PANEL - Abnormal; Notable for the following:    Glucose, Bld 105 (*)    BUN 28 (*)    GFR calc non Af Amer 76 (*)    GFR calc Af Amer 89 (*)    All other components within normal limits   Dg Chest 2 View  03/25/2013   *RADIOLOGY REPORT*  Clinical Data: Fall with pain  CHEST - 2 VIEW   Comparison: Sternum radiograph 03/25/2013  Findings: There are changes of median sternotomy.  The heart is mildly enlarged.  Pulmonary vascularity is normal and the lungs are normally expanded and clear.  Negative for pneumothorax or pleural effusion.  Visualized thoracic  spine vertebral bodies appear normal in height and alignment.  IMPRESSION: No acute cardiopulmonary disease is identified.  Please also see the concurrent sternum radiograph, dictated separately.   Original Report Authenticated By: Britta Mccreedy, M.D.   Dg Sternum  03/25/2013   *RADIOLOGY REPORT*  Clinical Data: Fall off bed today.  Complains of sternal pain. History of CABG approximately 40 years ago  STERNUM - 2+ VIEW  Comparison: CT abdomen pelvis 12/22/2004  Findings: No prior studies of the entire sternum are available for review.  The patient has five intact median sternotomy wires.  At the junction of the manubrium and body of the sternum is an irregular bony density anteriorly. A definite acute displaced sternal fracture line is not seen.  IMPRESSION: Focal bony irregularity anteriorly near the junction of the manubrium and body of sternum.  There are no prior chest studies for comparison.  This could be chronic bony irregularity in this patient status post prior median sternotomy.  Without priors, it is difficult to completely exclude an acute fracture.  Chest CT could be considered if clinically indicated.   Original Report Authenticated By: Britta Mccreedy, M.D.   Ct Chest Wo Contrast  03/25/2013   *RADIOLOGY REPORT*  Clinical Data: Midsternal chest pain after fall.  Assess for fracture.  CT CHEST WITHOUT CONTRAST  Technique:  Multidetector CT imaging of the chest was performed following the standard protocol without IV contrast.  Comparison: Radiograph 03/25/2013.  Findings: Median sternotomy.  There is a comminuted sternal body fracture at the sternomanubrial junction.  There is no depression. Displacement is minimal, with the  anterior superior sternal body displaced 6 mm anteriorly.  The manubrium appears intact.  There is hemorrhage without hematoma in the anterior mediastinal fat associated with the sternal fracture.  There is no mass effect on the heart.  This is compatible with venous bleeding.  The aorta grossly appears within normal limits.  Extensive artifact is present along the right coronary artery, presumably associated with prior surgical procedure.  No pericardial effusion.  Pericardial calcification is present, suggesting constrictive pericarditis.  Right anterolateral fourth and fifth rib fractures are present. These are buckle fractures without displacement.  There is no pneumothorax.  4 mm x 6 mm pulmonary nodule is present in the anterior right lower lobe adjacent to the minor fissure.  Incidental imaging of the upper abdomen demonstrates cholecystectomy.  Nodular hepatic contour compatible with cirrhosis.  IMPRESSION: 1.  Comminuted but minimally displaced sternal body fracture. Small amount of hemorrhage into the mediastinal fat is probably venous.  No hematoma. 2.  No evidence of acute aortic injury allowing for noncontrast technique. 3.  5 mm anterior right lower lobe pulmonary nodule.  If the patient is at high risk for bronchogenic carcinoma, follow-up chest CT at 6-12 months is recommended.  If the patient is at low risk for bronchogenic carcinoma, follow-up chest CT at 12 months is recommended.  This recommendation follows the consensus statement: Guidelines for Management of Small Pulmonary Nodules Detected on CT Scans: A Statement from the [employer] Society as published in Radiology 2005; 237:395-400. 4.  Hepatic cirrhosis. 5.  Median sternotomy and pericardial calcifications, probably associated with constrictive pericarditis. 6.  Nondisplaced right anterior rib fractures.  No pneumothorax.   Original Report Authenticated By: Andreas Newport, M.D.   Dg Shoulder Left  03/25/2013   *RADIOLOGY REPORT*   Clinical Data: Fall.  Pain.  LEFT SHOULDER - 2+ VIEW  Comparison: None  Findings: The left humeral head is located in the  glenoid fossa. Negative for subluxation or dislocation.  The acromioclavicular joint is aligned.  No acute fracture is identified.  IMPRESSION: Negative   Original Report Authenticated By: Britta Mccreedy, M.D.   1. Abrasion of scalp, initial encounter   2. Minor head injury, initial encounter   3. Acute chest wall pain   4. Sternal fracture, closed, initial encounter     MDM  Patients labs and/or radiological studies were viewed and considered during the medical decision making and disposition process.  Discussed patient with Dr Patria Mane prior to dc who agrees with no need for Ct head imaging.  Pt is not on blood thinners,  He did not directly hit his head and denies headache,  Only scalp soreness.  Pt's scalp abrasion was treated with bacitracin. Tetanus updated.    Plain film suggestive of sternal fracture,  CT recommended.  Pauline Aus,  PA will dispo pt once this study is completed.      Burgess Amor, PA-C 03/26/13 [phone]

## 2013-03-25 NOTE — ED Notes (Signed)
Pt reports fell off of deck onto gravel.  C/O soreness to chest, head, and middle of back.  Denies any neck pain.

## 2013-03-25 NOTE — ED Provider Notes (Signed)
7:11 PM Patient is a very small amount of blood in his anterior mediastinal fat associated with the sternal fracture.  There is no evidence of hematoma.  His vital signs are normal.  Is not on anticoagulation.  His a prior history of cirrhosis but states this is all cleared and therefore I do not think that he is auto anticoagulated.  I discussed options of admission versus discharge home with close followup and the patient is very much in going home.  I had a short discussion with the trauma surgeon regarding treatment and he agrees it is not unreasonable to send the patient home and he is well appearing with normal vital signs and not on anticoagulation and is a reliable patient.  The trauma surgeon did not evaluate the patient nor did he evaluate the CT scan,  he was simply discussing the case with me over the phone.  It is reasonable to discharge the patient home he is very eager to go home.  He is standing in his regular close and putting his shoes on and walking around the halls without any difficulty.  Is no significant chest pain or shortness of breath at this time.  He understands that things could become worse and he is understands to return the emergency department at anytime for new or worsening symptoms.  He will see his back in the emergency department tomorrow for recheck.  Dg Chest 2 View  03/25/2013   *RADIOLOGY REPORT*  Clinical Data: Fall with pain  CHEST - 2 VIEW  Comparison: Sternum radiograph 03/25/2013  Findings: There are changes of median sternotomy.  The heart is mildly enlarged.  Pulmonary vascularity is normal and the lungs are normally expanded and clear.  Negative for pneumothorax or pleural effusion.  Visualized thoracic spine vertebral bodies appear normal in height and alignment.  IMPRESSION: No acute cardiopulmonary disease is identified.  Please also see the concurrent sternum radiograph, dictated separately.   Original Report Authenticated By: Curlene Dolphin, M.D.   Dg  Sternum  03/25/2013   *RADIOLOGY REPORT*  Clinical Data: Fall off bed today.  Complains of sternal pain. History of CABG approximately 40 years ago  81 - 2+ VIEW  Comparison: CT abdomen pelvis 12/22/2004  Findings: No prior studies of the entire sternum are available for review.  The patient has five intact median sternotomy wires.  At the junction of the manubrium and body of the sternum is an irregular bony density anteriorly. A definite acute displaced sternal fracture line is not seen.  IMPRESSION: Focal bony irregularity anteriorly near the junction of the manubrium and body of sternum.  There are no prior chest studies for comparison.  This could be chronic bony irregularity in this patient status post prior median sternotomy.  Without priors, it is difficult to completely exclude an acute fracture.  Chest CT could be considered if clinically indicated.   Original Report Authenticated By: Curlene Dolphin, M.D.   Ct Chest Wo Contrast  03/25/2013   *RADIOLOGY REPORT*  Clinical Data: Midsternal chest pain after fall.  Assess for fracture.  CT CHEST WITHOUT CONTRAST  Technique:  Multidetector CT imaging of the chest was performed following the standard protocol without IV contrast.  Comparison: Radiograph 03/25/2013.  Findings: Median sternotomy.  There is a comminuted sternal body fracture at the sternomanubrial junction.  There is no depression. Displacement is minimal, with the anterior superior sternal body displaced 6 mm anteriorly.  The manubrium appears intact.  There is hemorrhage without hematoma in the anterior  mediastinal fat associated with the sternal fracture.  There is no mass effect on the heart.  This is compatible with venous bleeding.  The aorta grossly appears within normal limits.  Extensive artifact is present along the right coronary artery, presumably associated with prior surgical procedure.  No pericardial effusion.  Pericardial calcification is present, suggesting constrictive  pericarditis.  Right anterolateral fourth and fifth rib fractures are present. These are buckle fractures without displacement.  There is no pneumothorax.  4 mm x 6 mm pulmonary nodule is present in the anterior right lower lobe adjacent to the minor fissure.  Incidental imaging of the upper abdomen demonstrates cholecystectomy.  Nodular hepatic contour compatible with cirrhosis.  IMPRESSION: 1.  Comminuted but minimally displaced sternal body fracture. Small amount of hemorrhage into the mediastinal fat is probably venous.  No hematoma. 2.  No evidence of acute aortic injury allowing for noncontrast technique. 3.  5 mm anterior right lower lobe pulmonary nodule.  If the patient is at high risk for bronchogenic carcinoma, follow-up chest CT at 6-12 months is recommended.  If the patient is at low risk for bronchogenic carcinoma, follow-up chest CT at 12 months is recommended.  This recommendation follows the consensus statement: Guidelines for Management of Small Pulmonary Nodules Detected on CT Scans: A Statement from the [place] as published in Radiology 2005; 237:395-400. 4.  Hepatic cirrhosis. 5.  Median sternotomy and pericardial calcifications, probably associated with constrictive pericarditis. 6.  Nondisplaced right anterior rib fractures.  No pneumothorax.   Original Report Authenticated By: Dereck Ligas, M.D.   Dg Shoulder Left  03/25/2013   *RADIOLOGY REPORT*  Clinical Data: Fall.  Pain.  LEFT SHOULDER - 2+ VIEW  Comparison: None  Findings: The left humeral head is located in the glenoid fossa. Negative for subluxation or dislocation.  The acromioclavicular joint is aligned.  No acute fracture is identified.  IMPRESSION: Negative   Original Report Authenticated By: Curlene Dolphin, M.D.  I personally reviewed the imaging tests through PACS system I reviewed available ER/hospitalization records through the [facility], MD 03/25/13 [phone]

## 2013-03-25 NOTE — ED Notes (Signed)
Pt had to be given TD No boostrix available.

## 2013-03-25 NOTE — ED Notes (Signed)
Abrasion to scalp cleansed and antibiotic ointment applied.

## 2013-03-25 NOTE — ED Notes (Signed)
Pt was using a crow bar to remove a nail from a deck .  , tripped and fell, 5 ft onto ground. Pain sternal area, abrasions to scalp bandaged by triage.  NO LOC, alert, talking

## 2013-03-26 ENCOUNTER — Telehealth: Payer: Self-pay | Admitting: Internal Medicine

## 2013-03-26 ENCOUNTER — Encounter: Payer: Self-pay | Admitting: Family Medicine

## 2013-03-26 ENCOUNTER — Ambulatory Visit (INDEPENDENT_AMBULATORY_CARE_PROVIDER_SITE_OTHER): Payer: Medicare Other | Admitting: Family Medicine

## 2013-03-26 VITALS — BP 114/72 | HR 84 | Temp 98.4°F | Wt 162.5 lb

## 2013-03-26 DIAGNOSIS — S2220XA Unspecified fracture of sternum, initial encounter for closed fracture: Secondary | ICD-10-CM | POA: Insufficient documentation

## 2013-03-26 DIAGNOSIS — B360 Pityriasis versicolor: Secondary | ICD-10-CM

## 2013-03-26 MED ORDER — CLOTRIMAZOLE 1 % EX CREA: TOPICAL_CREAM | Freq: Two times a day (BID) | CUTANEOUS | Status: DC

## 2013-03-26 NOTE — Telephone Encounter (Signed)
Spoke with pt's wife.  I have advised her to follow up with his PCP for the broken sternum.  Also let her know how important it is for him to keep his follow up appointments with Dr Johney Frame

## 2013-03-26 NOTE — Telephone Encounter (Signed)
NEW PROBLEM   Pt fell yesterday & was seen at [facility] ER and has a broken sternum & pts wife needs to talk to you

## 2013-03-26 NOTE — Assessment & Plan Note (Signed)
Comminuted sternal fracture as well as R anterior rib fracture sustained after fall. h/o median sternotomy and pericardiectomy remotely. Discussed red flags to watch out for, discussed anticipated course of resolution. Continue hydrocodone and aleve prn, continue ice. Rest for next several weeks, avoid overexertion or any exertion really for next 1 week. Pt agrees with plan, knows to call me with any questions or concerns.

## 2013-03-26 NOTE — Patient Instructions (Signed)
Watch for fever, worsening pain instead of improving, or any shortness of breath. Continue ointment for scalp. For the skin rash - see below.  Use lotrimin cream twice daily for 3-5 days then use nizoral or selsun blue shampoo - place on skin for 10 mins then wash off. Good to see you today, call us with questions  Tinea Versicolor Tinea versicolor is a common yeast infection of the skin. This condition becomes known when the yeast on our skin starts to overgrow (yeast is a normal inhabitant on our skin). This condition is noticed as white or light brown patches on brown skin, and is more evident in the summer on tanned skin. These areas are slightly scaly if scratched. The light patches from the yeast become evident when the yeast creates "holes in your suntan". This is most often noticed in the summer. The patches are usually located on the chest, back, pubis, neck and body folds. However, it may occur on any area of body. Mild itching and inflammation (redness or soreness) may be present. DIAGNOSIS  The diagnosisof this is made clinically (by looking). Cultures from samples are usually not needed. Examination under the microscope may help. However, yeast is normally found on skin. The diagnosis still remains clinical. Examination under Wood's Ultraviolet Light can determine the extent of the infection. TREATMENT  This common infection is usually only of cosmetic (only a concern to your appearance). It is easily treated with dandruff shampoo used during showers or bathing. Vigorous scrubbing will eliminate the yeast over several days time. The light areas in your skin may remain for weeks or months after the infection is cured unless your skin is exposed to sunlight. The lighter or darker spots caused by the fungus that remain after complete treatment are not a sign of treatment failure; it will take a long time to resolve. Your caregiver may recommend a number of commercial preparations or medication by  mouth if home care is not working. Recurrence is common and preventative medication may be necessary. This skin condition is not highly contagious. Special care is not needed to protect close friends and family members. Normal hygiene is usually enough. Follow up is required only if you develop complications (such as a secondary infection from scratching), if recommended by your caregiver, or if no relief is obtained from the preparations used. Document Released: 08/19/2000 Document Revised: 11/14/2011 Document Reviewed: 10/01/2008 [facility] Patient Information 2014 [place], [place].

## 2013-03-26 NOTE — Assessment & Plan Note (Signed)
Discussed dx and etiology. Treat with clotrimazole daily for 5 days then start antifungal shampoo preventatively. update if questions. Avoid oral antifungal given h/o cirrhosis.

## 2013-03-26 NOTE — Progress Notes (Signed)
  Subjective:    Patient ID: Ernest Bell, male    DOB: 08-19-1946, 67 y.o.   MRN: [id number]  HPI CC: ER f/u  "I'm feeling well".  Presents with wife who recently was discharged from [facility].  Mr. Osuna presents today after having fall off deck, evaluated at [facility] for sternal fracture with small hematoma and scalp abrasion. Hasn't been taking hydrocodone.  Has been using wife's oxycodone.  Using aleve daily as well. Has been resting at home, staying in bed. Very sore with movement.  Td given at ER.  Imaging studies reviewed with patient. H/o cardiac cirrhosis - never drinker.  Never smoker.  All records reviewed (several xrays and CT scan) CT chest -  IMPRESSION:  1. Comminuted but minimally displaced sternal body fracture.  Small amount of hemorrhage into the mediastinal fat is probably  venous. No hematoma.  2. No evidence of acute aortic injury allowing for noncontrast  technique.  3. 5 mm anterior right lower lobe pulmonary nodule. If the  patient is at high risk for bronchogenic carcinoma, follow-up chest  CT at 6-12 months is recommended. If the patient is at low risk  for bronchogenic carcinoma, follow-up chest CT at 12 months is  recommended. This recommendation follows the consensus statement:  Guidelines for Management of Small Pulmonary Nodules Detected on CT  Scans: A Statement from the Fleischner Society as published in  Radiology 2005; 237:395-400.  4. Hepatic cirrhosis.  5. Median sternotomy and pericardial calcifications, probably  associated with constrictive pericarditis.  6. Nondisplaced right anterior rib fractures. No pneumothorax  2006 had similar R lower lobe pulm nodule.   Past Medical History  Diagnosis Date  . Persistent atrial fibrillation 09/15/10    S/P PVI (Cryo) at [facility]  . Atrial fib/flutter, transient 04/28/10    typical appearing atrial flutter dx 04/28/10 s/p CTI ablation 10/11  . 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 Depression   . DJD (degenerative joint disease)   . BPH (benign prostatic hyperplasia)   . Liver cirrhosis     ? at time of cholecystectomy  . Syncope 1997    recurrent S/P tilt study by Dr. Graciela Husbands (like due to dysautonomia)  . Gout     Past Surgical History  Procedure Laterality Date  . Pericardioectomy  1969    at [facility]  . Knee surgery  2007    he had osteoarthritis with left knee surgery  . Hemorroidectomy  2007  . Cholecystectomy  1995  . Right thumb surger      for osteoarthritis  . Afib abation  09/15/10    Cryo at [facility], CTI ablation at [facility] 04/28/10    Review of Systems Per HPI     Objective:   Physical Exam  Nursing note and vitals reviewed. Constitutional: He appears well-developed and well-nourished. No distress.  Cardiovascular: Normal rate, regular rhythm, normal heart sounds and intact distal pulses.   No murmur heard. Pulmonary/Chest: Effort normal and breath sounds normal. No respiratory distress. He has no wheezes. He has no rales. He exhibits tenderness.  Swelling L anterior chest wall superior portion of sternum and just lateral. Tender to palpation throughout anterior upper chest  Musculoskeletal: He exhibits no edema.  Skin: Skin is warm and dry. Rash noted.  Large abrasion superior scalp Erythematous circular rash diffusely present on trunk - chest and back       Assessment & Plan:

## 2013-03-27 NOTE — ED Provider Notes (Signed)
Medical screening examination/treatment/procedure(s) were conducted as a shared visit with non-physician practitioner(s) and myself.  I personally evaluated the patient during the encounter  Sternal fracture is very small associated likely venous bleeding.  Patient is very well appearing.  Please see my other note for complete details  Hoy Morn, MD 03/27/13 1101

## 2013-04-17 ENCOUNTER — Ambulatory Visit (INDEPENDENT_AMBULATORY_CARE_PROVIDER_SITE_OTHER): Payer: Medicare Other | Admitting: Internal Medicine

## 2013-04-17 ENCOUNTER — Encounter: Payer: Self-pay | Admitting: Internal Medicine

## 2013-04-17 VITALS — BP 135/85 | HR 72 | Ht 67.0 in | Wt 161.2 lb

## 2013-04-17 DIAGNOSIS — I4891 Unspecified atrial fibrillation: Secondary | ICD-10-CM

## 2013-04-17 NOTE — Patient Instructions (Addendum)
Your physician has requested that you have an exercise tolerance test. For further information please visit [email or web address]. Please also follow instruction sheet, as given. 3 WEEKS WITH EXTENDER  Your physician wants you to follow-up in: [place] will receive a reminder letter in the mail two months in advance. If you don't receive a letter, please call our office to schedule the follow-up appointment.

## 2013-04-17 NOTE — Progress Notes (Signed)
PCP:  Ria Bush, MD  The patient presents today for routine electrophysiology followup.  Since last being seen in our clinic, the patient reports doing reasonably well.  His exercise tolerance remains good.  Palpitations are improved.   Today, he denies symptoms of chest pain, shortness of breath, orthopnea, PND, lower extremity edema, dizziness, presyncope, syncope, or neurologic sequela.  The patient feels that he is tolerating medications without difficulties and is otherwise without complaint today.   Past Medical History  Diagnosis Date  . Persistent atrial fibrillation 09/15/10    S/P PVI (Cryo) at [facility]  . Atrial fib/flutter, transient 04/28/10    typical appearing atrial flutter dx 04/28/10 s/p CTI ablation 10/11  . 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 Depression   . DJD (degenerative joint disease)   . BPH (benign prostatic hyperplasia)   . Liver cirrhosis     ? at time of cholecystectomy  . Syncope 1997    recurrent S/P tilt study by Dr. [name] (like due to dysautonomia)  . Gout    Past Surgical History  Procedure Laterality Date  . Pericardioectomy  1969    at [facility]  . Knee surgery  2007    he had osteoarthritis with left knee surgery  . Hemorroidectomy  2007  . Cholecystectomy  1995  . Right thumb surger      for osteoarthritis  . Afib abation  09/15/10    Cryo at [place], CTI ablation at [facility] 04/28/10    Current Outpatient Prescriptions  Medication Sig Dispense Refill  . buPROPion (WELLBUTRIN XL) 150 MG 24 hr tablet Take 300 mg by mouth every evening.       Marland Kitchen HYDROcodone-acetaminophen (NORCO/VICODIN) 5-325 MG per tablet Take 1 tablet by mouth every 6 (six) hours as needed for pain.      . naproxen sodium (ALEVE) 220 MG tablet Take 220 mg by mouth every morning.      . sertraline (ZOLOFT) 100 MG tablet Take 100 mg by mouth daily.        . Tamsulosin HCl (FLOMAX) 0.4 MG CAPS Take 0.4 mg by mouth 2 (two)  times daily.       . temazepam (RESTORIL) 15 MG capsule Take 15 mg by mouth at bedtime as needed for sleep.       . verapamil (CALAN-SR) 180 MG CR tablet Take 1 tablet (180 mg total) by mouth at bedtime.  90 tablet  3   No current facility-administered medications for this visit.    Allergies  Allergen Reactions  . Penicillins Rash    History   Social History  . Marital Status: Married    Spouse Name: N/A    Number of Children: N/A  . Years of Education: N/A   Occupational History  . Disabled Education officer, museum     previously worked as a Higher education careers adviser   Social History Main Topics  . Smoking status: Never Smoker   . Smokeless tobacco: Not on file  . Alcohol Use: No  . Drug Use: No  . Sexual Activity: Not on file   Other Topics Concern  . Not on file   Social History Narrative   Patient lives in [place] with spouse. He enjoys walking on a regalar basis.    Family History  Problem Relation Age of Onset  . Hypertension Mother   . Alcohol abuse Father     smoker  . Heart disease Father     MI (  smoker)    ROS-  All systems are reviewed and are negative except as outlined in the HPI above   Physical Exam: Filed Vitals:   04/17/13 1555  BP: 135/85  Pulse: 72  Height: 5' 7"  (1.702 m)  Weight: 161 lb 3.2 oz (73.12 kg)    GEN- The patient is well appearing, alert and oriented x 3 today.   Head- normocephalic, atraumatic Eyes-  Sclera clear, conjunctiva pink Ears- hearing intact Oropharynx- clear Neck- supple, no JVP Lymph- no cervical lymphadenopathy Lungs- Clear to ausculation bilaterally, normal work of breathing Heart- Regular rate and rhythm, no murmurs, rubs or gallops, PMI not laterally displaced GI- soft, NT, ND, + BS Extremities- no clubbing, cyanosis, or edema Neuro- strength and sensation are intact  Ekg today reveals sinus rhythm with pacs  Assessment and Plan:  1. afib  Well controlled Continue flecainide 29m BID Will order GXT to evaluate  for exercise induced arrhythmias chads2vasc score is 1.  Will therefore keep off of anticoagulation at this time  Return in 6 months

## 2013-05-08 ENCOUNTER — Encounter: Payer: Medicare Other | Admitting: Physician Assistant

## 2013-05-31 ENCOUNTER — Telehealth: Payer: Self-pay

## 2013-05-31 ENCOUNTER — Encounter: Payer: Self-pay | Admitting: Family Medicine

## 2013-05-31 ENCOUNTER — Ambulatory Visit (INDEPENDENT_AMBULATORY_CARE_PROVIDER_SITE_OTHER): Payer: Medicare Other | Admitting: Family Medicine

## 2013-05-31 VITALS — BP 112/58 | HR 86 | Temp 98.3°F | Wt 167.8 lb

## 2013-05-31 DIAGNOSIS — J209 Acute bronchitis, unspecified: Secondary | ICD-10-CM | POA: Insufficient documentation

## 2013-05-31 MED ORDER — AZITHROMYCIN 250 MG PO TABS: ORAL_TABLET | ORAL | Status: DC

## 2013-05-31 NOTE — Telephone Encounter (Signed)
Noted  

## 2013-05-31 NOTE — Telephone Encounter (Signed)
[facility] left v/m re; Zpak rx was dropped off to be filled and pt is also on Flecanide that in combination with Zpak can elongate QT interval. I called [facility] back to verify poor quality v/m that was left and was told to disregard call because pt is no longer on Flecainide.

## 2013-05-31 NOTE — Progress Notes (Signed)
  Subjective:    Patient ID: Ernest Bell, male    DOB: 1945/12/30, 67 y.o.   MRN: [id number]  Cough This is a new problem. The current episode started in the past 7 days. The problem has been gradually worsening. The problem occurs every few minutes. The cough is non-productive. Associated symptoms include headaches, nasal congestion and a sore throat. Pertinent negatives include no chest pain, ear congestion, ear pain, fever, heartburn, hemoptysis, rhinorrhea, shortness of breath, sweats or wheezing. Associated symptoms comments: Chest congestion Cough is keeping him up at night. Nothing aggravates the symptoms. Treatments tried: alkaseltzer plus, There is no history of asthma, bronchiectasis, bronchitis, COPD, emphysema, environmental allergies or pneumonia. non smoker      Review of Systems  Constitutional: Negative for fever.  HENT: Positive for sore throat. Negative for ear pain and rhinorrhea.   Respiratory: Positive for cough. Negative for hemoptysis, shortness of breath and wheezing.   Cardiovascular: Negative for chest pain.  Gastrointestinal: Negative for heartburn.  Allergic/Immunologic: Negative for environmental allergies.  Neurological: Positive for headaches.       Objective:   Physical Exam  Constitutional: Vital signs are normal. He appears well-developed and well-nourished.  Non-toxic appearance. He does not appear ill. No distress.  HENT:  Head: Normocephalic and atraumatic.  Right Ear: Hearing, tympanic membrane, external ear and ear canal normal. No tenderness. No foreign bodies. Tympanic membrane is not retracted and not bulging.  Left Ear: Hearing, tympanic membrane, external ear and ear canal normal. No tenderness. No foreign bodies. Tympanic membrane is not retracted and not bulging.  Nose: Nose normal. No mucosal edema or rhinorrhea. Right sinus exhibits no maxillary sinus tenderness and no frontal sinus tenderness. Left sinus exhibits no maxillary sinus  tenderness and no frontal sinus tenderness.  Mouth/Throat: Uvula is midline, oropharynx is clear and moist and mucous membranes are normal. Normal dentition. No dental caries. No oropharyngeal exudate or tonsillar abscesses.  Eyes: Conjunctivae, EOM and lids are normal. Pupils are equal, round, and reactive to light. Lids are everted and swept, no foreign bodies found.  Neck: Trachea normal, normal range of motion and phonation normal. Neck supple. Carotid bruit is not present. No mass and no thyromegaly present.  Cardiovascular: Normal rate, regular rhythm, S1 normal, S2 normal, normal heart sounds, intact distal pulses and normal pulses.  Exam reveals no gallop.   No murmur heard. Pulmonary/Chest: Effort normal and breath sounds normal. No respiratory distress. He has no wheezes. He has no rhonchi. He has no rales.  Abdominal: Soft. Normal appearance and bowel sounds are normal. There is no hepatosplenomegaly. There is no tenderness. There is no rebound, no guarding and no CVA tenderness. No hernia.  Neurological: He is alert. He has normal reflexes.  Skin: Skin is warm, dry and intact. No rash noted.  Psychiatric: He has a normal mood and affect. His speech is normal and behavior is normal. Judgment normal.          Assessment & Plan:

## 2013-05-31 NOTE — Patient Instructions (Addendum)
Mucinex DM during the day. Cough suppressant at night. If not improving as expected in next 2-4 days.... Can fill antibiotics. If shortness of breath or chest pain.  Go to ER.

## 2013-05-31 NOTE — Assessment & Plan Note (Signed)
Most liekly viral infeciton... symptomstic care. If not improving as expected start antibiotics given age and hx of pericarditis.

## 2013-06-03 ENCOUNTER — Encounter: Payer: Medicare Other | Admitting: Physician Assistant

## 2013-06-11 ENCOUNTER — Encounter: Payer: Self-pay | Admitting: Physician Assistant

## 2013-07-11 ENCOUNTER — Other Ambulatory Visit: Payer: Self-pay

## 2013-10-14 ENCOUNTER — Telehealth: Payer: Self-pay | Admitting: Internal Medicine

## 2013-10-14 NOTE — Telephone Encounter (Signed)
New Message  Pt wife called states that the pt recently had a "bad episode" of exhaustion, Disoriented, total confusion. She is requesting a call back to receive a same day appt/please call

## 2013-10-14 NOTE — Telephone Encounter (Signed)
Wife reports that yesterday in church the pt became completely disoriented, had inappropriate speech, and then was completely exhausted until late afternoon.  His speech was clear, and he did not exhibit any weakness or facial drooping. The disorientation has completely cleared but he is still tired.  She also reports that he has cirrhosis and has not had it evaluated in years.  Scheduled appointment for 2/20 with Dr. Rayann Heman (6 month f/u).  Rec that she contact PCP to get appointment ASAP due to this episode.

## 2013-10-15 ENCOUNTER — Ambulatory Visit (INDEPENDENT_AMBULATORY_CARE_PROVIDER_SITE_OTHER): Payer: Medicare Other | Admitting: Family Medicine

## 2013-10-15 ENCOUNTER — Encounter: Payer: Self-pay | Admitting: Family Medicine

## 2013-10-15 VITALS — BP 128/64 | HR 72 | Temp 97.3°F | Ht 67.0 in | Wt 170.2 lb

## 2013-10-15 DIAGNOSIS — R5383 Other fatigue: Secondary | ICD-10-CM

## 2013-10-15 DIAGNOSIS — R209 Unspecified disturbances of skin sensation: Secondary | ICD-10-CM

## 2013-10-15 DIAGNOSIS — R410 Disorientation, unspecified: Secondary | ICD-10-CM | POA: Insufficient documentation

## 2013-10-15 DIAGNOSIS — R5381 Other malaise: Secondary | ICD-10-CM | POA: Insufficient documentation

## 2013-10-15 DIAGNOSIS — F05 Delirium due to known physiological condition: Principal | ICD-10-CM

## 2013-10-15 NOTE — Assessment & Plan Note (Addendum)
EKG NSR, not in afib  Still some concern about CVA, no bruit on exam. nml neuro exam today.  Will eval with labs if no clear secondary cause... Consider carotid dopplers and MRI brain.   wife mentions ? Past liver issue, ? Nonalcoholic cirrhosis... intermittant hepatomegaly? None on exam today.  Eval LFTS and ammonia level.

## 2013-10-15 NOTE — Progress Notes (Signed)
Pre-visit discussion using our clinic review tool. No additional management support is needed unless otherwise documented below in the visit note.  

## 2013-10-15 NOTE — Progress Notes (Signed)
   Subjective:    Patient ID: Ernest Bell, male    DOB: Feb 15, 1946, 68 y.o.   MRN: [id number]  HPI  68 year old male with hx of atrial fib of flecanide ( followed by Dr. Rayann Heman), hx of constrictive pericarditis, OSA, palpitations presents with fatigue ongoing in last several months.  Then 3 days ago had an episdoe of disorientation at church during breakfast, started falling asleep, couldn't stay awake.   Then was confused.. Did not know where he was, was not making sense talking (speaking randomly) No slurred speech.no weakness, no vision change, no facial droop. No headache. Continued through service. Tired to write  a check, could not.  He is not on anti coagulant since ablation.  No ETOH use, no nausea, vomiting, no diarrhea. No pain.  Wife is concerned about his liver... She states sometime liver seems enlarged, worsening  Off and on, no peripheral edema.   He went to bed, slept all afternoon, then was back to normal by that evening.  he could not remember anything aboutthe day.  No recent new meds.     Review of Systems  Constitutional: Positive for fatigue. Negative for fever.  HENT: Negative for ear pain.   Eyes: Negative for pain.  Respiratory: Negative for cough and shortness of breath.   Cardiovascular: Negative for chest pain, palpitations and leg swelling.  Gastrointestinal: Positive for abdominal distention. Negative for abdominal pain.  Genitourinary: Negative for dysuria.       Objective:   Physical Exam  Constitutional: He is oriented to person, place, and time. Vital signs are normal. He appears well-developed and well-nourished.  HENT:  Head: Normocephalic.  Right Ear: Hearing normal.  Left Ear: Hearing normal.  Nose: Nose normal.  Mouth/Throat: Oropharynx is clear and moist and mucous membranes are normal.  Neck: Trachea normal. Carotid bruit is not present. No mass and no thyromegaly present.  Cardiovascular: Normal rate, regular rhythm and  normal pulses.  Exam reveals no gallop, no distant heart sounds and no friction rub.   No murmur heard. No peripheral edema  Pulmonary/Chest: Effort normal and breath sounds normal. No respiratory distress.  Neurological: He is alert and oriented to person, place, and time. He has normal strength and normal reflexes. No cranial nerve deficit or sensory deficit. He displays a negative Romberg sign. Coordination and gait normal. GCS eye subscore is 4. GCS verbal subscore is 5. GCS motor subscore is 6.  nml cerebellar exam  Skin: Skin is warm, dry and intact. No rash noted.  Psychiatric: He has a normal mood and affect. His speech is normal and behavior is normal. Thought content normal.          Assessment & Plan:

## 2013-10-15 NOTE — Patient Instructions (Addendum)
Stop at lab on way out.  I will call with results as soon  as they are back.  If symptoms recur.. Go to ER for further evaluation.

## 2013-10-15 NOTE — Assessment & Plan Note (Signed)
Eval with labs: cbc, tsh, b12, CMET etc.

## 2013-10-16 LAB — CBC WITH DIFFERENTIAL/PLATELET
Basophils Absolute: 0.1 10*3/uL (ref 0.0–0.1)
Basophils Relative: 0.9 % (ref 0.0–3.0)
Eosinophils Absolute: 0.3 10*3/uL (ref 0.0–0.7)
Eosinophils Relative: 3.5 % (ref 0.0–5.0)
HCT: 42.1 % (ref 39.0–52.0)
Hemoglobin: 14.1 g/dL (ref 13.0–17.0)
Lymphocytes Relative: 28.6 % (ref 12.0–46.0)
Lymphs Abs: 2.3 10*3/uL (ref 0.7–4.0)
MCHC: 33.6 g/dL (ref 30.0–36.0)
MCV: 92.1 fl (ref 78.0–100.0)
Monocytes Absolute: 0.8 10*3/uL (ref 0.1–1.0)
Monocytes Relative: 9.4 % (ref 3.0–12.0)
Neutro Abs: 4.6 10*3/uL (ref 1.4–7.7)
Neutrophils Relative %: 57.6 % (ref 43.0–77.0)
Platelets: 196 10*3/uL (ref 150.0–400.0)
RBC: 4.57 Mil/uL (ref 4.22–5.81)
RDW: 13.1 % (ref 11.5–14.6)
WBC: 8 10*3/uL (ref 4.5–10.5)

## 2013-10-16 LAB — COMPREHENSIVE METABOLIC PANEL
ALT: 25 U/L (ref 0–53)
AST: 22 U/L (ref 0–37)
Albumin: 4.3 g/dL (ref 3.5–5.2)
Alkaline Phosphatase: 49 U/L (ref 39–117)
BUN: 21 mg/dL (ref 6–23)
CO2: 25 mEq/L (ref 19–32)
Calcium: 9 mg/dL (ref 8.4–10.5)
Chloride: 109 mEq/L (ref 96–112)
Creatinine, Ser: 0.7 mg/dL (ref 0.4–1.5)
GFR: 117.47 mL/min (ref >60.00)
Glucose, Bld: 95 mg/dL (ref 70–99)
Potassium: 4 mEq/L (ref 3.5–5.1)
Sodium: 141 mEq/L (ref 135–145)
Total Bilirubin: 1.7 mg/dL — ABNORMAL HIGH (ref 0.3–1.2)
Total Protein: 6.5 g/dL (ref 6.0–8.3)

## 2013-10-16 LAB — VITAMIN B12: Vitamin B-12: 400 pg/mL (ref 211–911)

## 2013-10-16 LAB — TSH: TSH: 0.61 u[IU]/mL (ref 0.35–5.50)

## 2013-10-16 LAB — AMMONIA: Ammonia: 33 umol/L (ref 16–53)

## 2013-10-17 NOTE — Addendum Note (Signed)
Addended by: [name] E on: 10/17/2013 02:23 PM   Modules accepted: Orders

## 2013-10-18 ENCOUNTER — Other Ambulatory Visit (HOSPITAL_COMMUNITY): Payer: Self-pay | Admitting: *Deleted

## 2013-10-18 DIAGNOSIS — R4182 Altered mental status, unspecified: Secondary | ICD-10-CM

## 2013-10-24 ENCOUNTER — Ambulatory Visit
Admission: RE | Admit: 2013-10-24 | Discharge: 2013-10-24 | Disposition: A | Discharge status: Home / Self Care | Payer: 59 | Source: Ambulatory Visit | Attending: Family Medicine | Admitting: Family Medicine

## 2013-10-24 DIAGNOSIS — R410 Disorientation, unspecified: Secondary | ICD-10-CM

## 2013-10-24 DIAGNOSIS — F05 Delirium due to known physiological condition: Principal | ICD-10-CM

## 2013-10-25 ENCOUNTER — Encounter: Payer: Self-pay | Admitting: Internal Medicine

## 2013-10-25 ENCOUNTER — Telehealth: Payer: Self-pay | Admitting: Family Medicine

## 2013-10-25 ENCOUNTER — Ambulatory Visit (INDEPENDENT_AMBULATORY_CARE_PROVIDER_SITE_OTHER): Payer: 59 | Admitting: Internal Medicine

## 2013-10-25 VITALS — BP 110/70 | HR 71 | Ht 66.0 in | Wt 174.0 lb

## 2013-10-25 DIAGNOSIS — R5383 Other fatigue: Secondary | ICD-10-CM

## 2013-10-25 DIAGNOSIS — R55 Syncope and collapse: Secondary | ICD-10-CM

## 2013-10-25 DIAGNOSIS — I4891 Unspecified atrial fibrillation: Secondary | ICD-10-CM

## 2013-10-25 DIAGNOSIS — R5381 Other malaise: Secondary | ICD-10-CM

## 2013-10-25 MED ORDER — DILTIAZEM HCL ER COATED BEADS 120 MG PO CP24: 120.0000 mg | ORAL_CAPSULE | Freq: Every day | ORAL | Status: DC

## 2013-10-25 NOTE — Telephone Encounter (Signed)
Discussed results of MRI brain in detail with pt.. No acute changes to explain episode last week. There are chronic microvascular changes present .No further confusional episodes.  Will forward results to PCP and cardiology (pt is seeing Dr Rayann Heman today and would like him to be aware). Carotid dopplers are pending.. Recommended cholesterol control. Offered neuro referral, pt would like to hold on this for now.  Discuss further at next appt with PCP.

## 2013-10-25 NOTE — Patient Instructions (Addendum)
Your physician wants you to follow-up in: 6 months with Dr Vallery Ridge will receive a reminder letter in the mail two months in advance. If you don't receive a letter, please call our office to schedule the follow-up appointment.     Your physician has requested that you have en exercise stress myoview. For further information please visit [email or web address]. Please follow instruction sheet, as given.  Your physician has recommended you make the following change in your medication:  1) Start Cardizem 120mg  daily

## 2013-10-27 NOTE — Progress Notes (Signed)
PCP:  [name], MD  The patient presents today for routine electrophysiology followup.  Since last being seen in our clinic, the patient reports doing reasonably well.  His exercise tolerance remains good.  Palpitations are improved. He did have an episode of abrupt onset lethargy in church recently.  He has been evaluated with MRI which showed vascular changes but without acute stroke.  He has carotid dopplers pending.  He thinks that he is maintaining sinus rhythm.  Today, he denies symptoms of chest pain, shortness of breath, orthopnea, PND, lower extremity edema, dizziness, presyncope, syncope, or further neurologic sequela.  The patient feels that he is tolerating medications without difficulties and is otherwise without complaint today.   Past Medical History  Diagnosis Date  . Persistent atrial fibrillation 09/15/10    S/P PVI ([facility]ryo) at [facility]  . Atrial fib/flutter, transient 04/28/10    typical appearing atrial flutter dx 04/28/10 s/p CTI ablation 10/11  . 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 Depression   . DJD (degenerative joint disease)   . BPH (benign prostatic hyperplasia)   . Liver cirrhosis     ? at time of cholecystectomy  . Syncope 1997    recurrent S/P tilt study by Dr. [name] (like due to dysautonomia)  . Gout    Past Surgical History  Procedure Laterality Date  . Pericardioectomy  1969    at [facility]  . Knee surgery  2007    he had osteoarthritis with left knee surgery  . Hemorroidectomy  2007  . Cholecystectomy  1995  . Right thumb surger      for osteoarthritis  . Afib abation  09/15/10    Cryo at [place], CTI ablation at [facility] 04/28/10    Current Outpatient Prescriptions  Medication Sig Dispense Refill  . flecainide (TAMBOCOR) 50 MG tablet Take 50 mg by mouth daily.      . naproxen sodium (ANAPROX) 220 MG tablet Take 220 mg by mouth 2 (two) times daily.      . sertraline (ZOLOFT) 100 MG tablet Take 100 mg  by mouth daily.        . Tamsulosin HCl (FLOMAX) 0.4 MG CAPS Take 0.4 mg by mouth 2 (two) times daily.       Marland Kitchen diltiazem (CARDIZEM CD) 120 MG 24 hr capsule Take 1 capsule (120 mg total) by mouth daily.  90 capsule  3   No current facility-administered medications for this visit.    Allergies  Allergen Reactions  . Penicillins Rash    History   Social History  . Marital Status: Married    Spouse Name: N/A    Number of Children: N/A  . Years of Education: N/A   Occupational History  . Disabled Education officer, museum     previously worked as a Higher education careers adviser   Social History Main Topics  . Smoking status: Never Smoker   . Smokeless tobacco: Not on file  . Alcohol Use: No  . Drug Use: No  . Sexual Activity: Not on file   Other Topics Concern  . Not on file   Social History Narrative   Patient lives in [place] with spouse. He enjoys walking on a regalar basis.    Family History  Problem Relation Age of Onset  . Hypertension Mother   . Alcohol abuse Father     smoker  . Heart disease Father     MI (smoker)    ROS-  All systems  are reviewed and are negative except as outlined in the HPI above   Physical Exam: Filed Vitals:   10/25/13 1402  BP: 110/70  Pulse: 71  Height: 5' 6"  (1.676 m)  Weight: 174 lb (78.926 kg)    GEN- The patient is well appearing, alert and oriented x 3 today.   Head- normocephalic, atraumatic Eyes-  Sclera clear, conjunctiva pink Ears- hearing intact Oropharynx- clear Neck- supple, no JVP Lymph- no cervical lymphadenopathy Lungs- Clear to ausculation bilaterally, normal work of breathing Heart- Regular rate and rhythm, no murmurs, rubs or gallops, PMI not laterally displaced GI- soft, NT, ND, + BS Extremities- no clubbing, cyanosis, or edema Neuro- strength and sensation are intact  Ekg 10/15/13 reveals sinus rhythm   Assessment and Plan:  1. afib  Well controlled Continue flecainide 64m BID.  He needs GXT myoview to evaluate for  ischemia on this medicine.  Given prior SOB and fatigue, I think that myoview is necessary.  I will also add cardizem 1288mdaily as he should not be on flecainide without av nodal blockade. chads2vasc score is 1.  Will therefore keep off of anticoagulation at this time.  Carotid dopplers are pending He declines neurology assessment.  Return in 6 months

## 2013-11-06 ENCOUNTER — Encounter: Payer: Self-pay | Admitting: Cardiology

## 2013-11-06 ENCOUNTER — Encounter: Payer: Self-pay | Admitting: Internal Medicine

## 2013-11-14 ENCOUNTER — Ambulatory Visit (HOSPITAL_COMMUNITY): Payer: Medicare Other | Attending: Cardiovascular Disease | Admitting: Radiology

## 2013-11-14 VITALS — BP 138/78 | HR 65 | Ht 67.0 in | Wt 171.0 lb

## 2013-11-14 DIAGNOSIS — I4891 Unspecified atrial fibrillation: Secondary | ICD-10-CM | POA: Insufficient documentation

## 2013-11-14 DIAGNOSIS — R0602 Shortness of breath: Secondary | ICD-10-CM

## 2013-11-14 DIAGNOSIS — R5383 Other fatigue: Secondary | ICD-10-CM

## 2013-11-14 DIAGNOSIS — R55 Syncope and collapse: Secondary | ICD-10-CM

## 2013-11-14 DIAGNOSIS — R5381 Other malaise: Secondary | ICD-10-CM | POA: Insufficient documentation

## 2013-11-14 MED ORDER — TECHNETIUM TC 99M SESTAMIBI GENERIC - CARDIOLITE
33.0000 | Freq: Once | INTRAVENOUS | Status: AC | PRN
  Administered 2013-11-14: 33 via INTRAVENOUS

## 2013-11-14 MED ORDER — TECHNETIUM TC 99M SESTAMIBI GENERIC - CARDIOLITE
11.0000 | Freq: Once | INTRAVENOUS | Status: AC | PRN
  Administered 2013-11-14: 11 via INTRAVENOUS

## 2013-11-14 NOTE — Progress Notes (Signed)
[facility] [address] [place], [place] [address] [phone]    Cardiology Nuclear Med Study  Ernest Bell is a 68 y.o. male     MRN : [id number]     DOB: 1946/07/21  Procedure Date: 11/14/2013  Nuclear Med Background Indication for Stress Test:  Evaluation for Ischemia and assess for Ventricular Arrhythmia on Flecainide History:  No known CAD, Afib s/p ablation, Echo 2014 EF 55-60%, MPI 2012 ok per pt. Cardiac Risk Factors: Family History - CAD  Symptoms:  Fatigue and SOB   Nuclear Pre-Procedure Caffeine/Decaff Intake:  None > 12 hrs NPO After: 11:00pm   Lungs:  clear O2 Sat: 98% on room air. IV 0.9% NS with Angio Cath:  22g  IV Site: R Antecubital x 1, tolerated well IV Started by:  Irven Baltimore, RN  Chest Size (in):  42 Cup Size: n/a  Height: 5\' 7"  (1.702 m)  Weight:  171 lb (77.565 kg)  BMI:  Body mass index is 26.78 kg/(m^2). Tech Comments:  Took Cardizem, and Flecainide this am    Nuclear Med Study 1 or 2 day study: 1 day  Stress Test Type:  Stress  Reading MD: N/A  Order Authorizing Provider:  [name], MD  Resting Radionuclide: Technetium 81m Sestamibi  Resting Radionuclide Dose: 11.0 mCi   Stress Radionuclide:  Technetium 33m Sestamibi  Stress Radionuclide Dose: 33.0 mCi           Stress Protocol Rest HR: 65 Stress HR: 129  Rest BP: 138/78 Stress BP: 181/63  Exercise Time (min): 10:23 METS: 12.3           Dose of Adenosine (mg):  n/a Dose of Lexiscan: n/a mg  Dose of Atropine (mg): n/a Dose of Dobutamine: n/a mcg/kg/min (at max HR)  Stress Test Technologist: Glade Lloyd, BS-ES  Nuclear Technologist:  Annye Rusk, CNMT     Rest Procedure:  Myocardial perfusion imaging was performed at rest 45 minutes following the intravenous administration of Technetium 33m Sestamibi. Rest ECG: NSR - Normal EKG  Stress Procedure:  The patient exercised on the treadmill utilizing the Bruce Protocol for 10:23 minutes. The  patient stopped due to being very fatigued and denied any chest pain.  Technetium 81m Sestamibi was injected at peak exercise and myocardial perfusion imaging was performed after a brief delay. Stress ECG: No significant change from baseline ECG. Rare isolated PVCs   QPS Raw Data Images:  Normal; no motion artifact; normal heart/lung ratio. Stress Images:  Normal homogeneous uptake in all areas of the myocardium. Rest Images:  Normal homogeneous uptake in all areas of the myocardium. Subtraction (SDS):  No evidence of ischemia. Transient Ischemic Dilatation (Normal <1.22):  0.96 Lung/Heart Ratio (Normal <0.45):  0.43  Quantitative Gated Spect Images QGS EDV:  94 ml QGS ESV:  33 ml  Impression Exercise Capacity:  Good exercise capacity. BP Response:  Normal blood pressure response. Clinical Symptoms:  No significant symptoms noted. ECG Impression:  No significant ST segment change suggestive of ischemia. Comparison with Prior Nuclear Study: No significant change from previous study  Overall Impression:  Normal stress nuclear study.  LV Ejection Fraction: 65%.  LV Wall Motion:  NL LV Function; NL Wall Motion   [employer]

## 2014-01-07 ENCOUNTER — Other Ambulatory Visit: Payer: Self-pay | Admitting: Internal Medicine

## 2014-01-14 ENCOUNTER — Encounter: Payer: Self-pay | Admitting: Cardiology

## 2014-03-03 ENCOUNTER — Encounter: Payer: Self-pay | Admitting: Family Medicine

## 2014-03-03 ENCOUNTER — Telehealth: Payer: Self-pay

## 2014-03-03 ENCOUNTER — Ambulatory Visit (INDEPENDENT_AMBULATORY_CARE_PROVIDER_SITE_OTHER): Payer: Medicare Other | Admitting: Family Medicine

## 2014-03-03 VITALS — BP 126/78 | HR 76 | Temp 98.3°F | Ht 65.5 in | Wt 170.8 lb

## 2014-03-03 DIAGNOSIS — I4891 Unspecified atrial fibrillation: Secondary | ICD-10-CM

## 2014-03-03 DIAGNOSIS — S161XXA Strain of muscle, fascia and tendon at neck level, initial encounter: Secondary | ICD-10-CM | POA: Insufficient documentation

## 2014-03-03 DIAGNOSIS — Z0000 Encounter for general adult medical examination without abnormal findings: Secondary | ICD-10-CM | POA: Insufficient documentation

## 2014-03-03 DIAGNOSIS — E785 Hyperlipidemia, unspecified: Secondary | ICD-10-CM | POA: Insufficient documentation

## 2014-03-03 DIAGNOSIS — Z23 Encounter for immunization: Secondary | ICD-10-CM

## 2014-03-03 DIAGNOSIS — F329 Major depressive disorder, single episode, unspecified: Secondary | ICD-10-CM | POA: Insufficient documentation

## 2014-03-03 DIAGNOSIS — F32A Depression, unspecified: Secondary | ICD-10-CM | POA: Insufficient documentation

## 2014-03-03 HISTORY — DX: Hyperlipidemia, unspecified: E78.5

## 2014-03-03 MED ORDER — SERTRALINE HCL 50 MG PO TABS: 50.0000 mg | ORAL_TABLET | Freq: Every day | ORAL | Status: DC

## 2014-03-03 MED ORDER — METHOCARBAMOL 500 MG PO TABS: 500.0000 mg | ORAL_TABLET | Freq: Three times a day (TID) | ORAL | Status: DC | PRN

## 2014-03-03 NOTE — Assessment & Plan Note (Signed)
Preventative protocols reviewed and updated unless pt declined. Discussed healthy diet and lifestyle.  

## 2014-03-03 NOTE — Addendum Note (Signed)
Addended by: Royann Shivers A on: 03/03/2014 01:02 PM   Modules accepted: Orders

## 2014-03-03 NOTE — Telephone Encounter (Signed)
PA has been faxed to [facility] and awaiting response

## 2014-03-03 NOTE — Assessment & Plan Note (Signed)
Due for recheck - return fasting for FLP

## 2014-03-03 NOTE — Progress Notes (Signed)
BP 126/78  Pulse 76  Temp(Src) 98.3 F (36.8 C) (Oral)  Ht 5' 5.5" (1.664 m)  Wt 170 lb 12 oz (77.452 kg)  BMI 27.97 kg/m2   CC: medicare wellness, initial  Subjective:    Patient ID: Ernest Bell, male    DOB: Feb 13, 1946, 68 y.o.   MRN: [id number]  HPI: Ernest Bell is a 68 y.o. male presenting on 03/03/2014 for Annual Exam   Increasing neck pain - takes aleve twice daily for last 6 months.  No headaches shooting pain down arms. Occasional foot paresthesias at night time.  Hearing screen passed R better than left. Endorses pounding in left ear. Vision screen passed on right, 20/50 on left. No falls in past year. Denies depression, anhedonia, sadness.  Preventative: Colon cancer screening - denies changes or blood in stool. Has had colonoscopies in past but unsure where or when. Has seen Dr. [name] in past - will request records from [place]. Prostate cancer screening - h/o BPH - sees Dr. Eliberto Ivory twice yearly. Flu shot - not done last year Pneumovax 2012. prevnar today Td 03/2013 zostavax - interested in this. Advanced directives: has at home. HCPOA is wife.  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 Diet: good water, fruits/vegetables daily  Relevant past medical, surgical, family and social history reviewed and updated as indicated.  Allergies and medications reviewed and updated. Current Outpatient Prescriptions on File Prior to Visit  Medication Sig  . diltiazem (CARDIZEM CD) 120 MG 24 hr capsule Take 1 capsule (120 mg total) by mouth daily.  . flecainide (TAMBOCOR) 50 MG tablet TAKE 1 TABLET BY MOUTH TWICE DAILY  . naproxen sodium (ANAPROX) 220 MG tablet Take 220 mg by mouth 2 (two) times daily.  . Tamsulosin HCl (FLOMAX) 0.4 MG CAPS Take 0.4 mg by mouth 2 (two) times daily.    No current facility-administered medications on  file prior to visit.    Review of Systems  Constitutional: Negative for fever, chills, activity change, appetite change, fatigue and unexpected weight change.  HENT: Negative for hearing loss.   Eyes: Negative for visual disturbance.  Respiratory: Negative for cough, chest tightness, shortness of breath and wheezing.   Cardiovascular: Negative for chest pain, palpitations and leg swelling.  Gastrointestinal: Negative for nausea, vomiting, abdominal pain, diarrhea, constipation, blood in stool and abdominal distention.  Genitourinary: Negative for hematuria and difficulty urinating.  Musculoskeletal: Negative for arthralgias, myalgias and neck pain.  Skin: Negative for rash.  Neurological: Negative for dizziness, seizures, syncope and headaches.  Hematological: Negative for adenopathy. Does not bruise/bleed easily.  Psychiatric/Behavioral: Negative for dysphoric mood. The patient is not nervous/anxious.    Per HPI unless specifically indicated above    Objective:    BP 126/78  Pulse 76  Temp(Src) 98.3 F (36.8 C) (Oral)  Ht 5' 5.5" (1.664 m)  Wt 170 lb 12 oz (77.452 kg)  BMI 27.97 kg/m2  Physical Exam  Nursing note and vitals reviewed. Constitutional: He is oriented to person, place, and time. He appears well-developed and well-nourished. No distress.  HENT:  Head: Normocephalic and atraumatic.  Right Ear: Hearing, tympanic membrane, external ear and ear canal normal.  Left Ear: Hearing, tympanic membrane, external ear and ear canal normal.  Nose: Nose normal.  Mouth/Throat: Uvula is midline, oropharynx is clear and moist and mucous membranes are normal. No oropharyngeal exudate, posterior  oropharyngeal edema or posterior oropharyngeal erythema.  Eyes: Conjunctivae and EOM are normal. Pupils are equal, round, and reactive to light. No scleral icterus.  Neck: Normal range of motion. Neck supple. Carotid bruit is not present. No thyromegaly present.  Cardiovascular: Normal rate,  normal heart sounds and intact distal pulses.  An irregular rhythm present.  No murmur heard. Pulses:      Radial pulses are 2+ on the right side, and 2+ on the left side.  Pulmonary/Chest: Effort normal and breath sounds normal. No respiratory distress. He has no wheezes. He has no rales.  Abdominal: Soft. Bowel sounds are normal. He exhibits no distension and no mass. There is no tenderness. There is no rebound and no guarding.  Musculoskeletal: Normal range of motion. He exhibits no edema.  FROM at neck, but pain with lateral flexion bilaterally. No midline spine tenderness. + pain to palpation of paracervical neck muscles  Lymphadenopathy:    He has no cervical adenopathy.  Neurological: He is alert and oriented to person, place, and time.  CN grossly intact, station and gait intact Recall 3/3 Calculation 4/5 serial 7s  Skin: Skin is warm and dry. No rash noted.  Psychiatric: He has a normal mood and affect. His behavior is normal. Judgment and thought content normal.   Results for orders placed in visit on 10/15/13  CBC WITH DIFFERENTIAL      Result Value Ref Range   WBC 8.0  4.5 - 10.5 K/uL   RBC 4.57  4.22 - 5.81 Mil/uL   Hemoglobin 14.1  13.0 - 17.0 g/dL   HCT 42.1  39.0 - 52.0 %   MCV 92.1  78.0 - 100.0 fl   MCHC 33.6  30.0 - 36.0 g/dL   RDW 13.1  11.5 - 14.6 %   Platelets 196.0  150.0 - 400.0 K/uL   Neutrophils Relative % 57.6  43.0 - 77.0 %   Lymphocytes Relative 28.6  12.0 - 46.0 %   Monocytes Relative 9.4  3.0 - 12.0 %   Eosinophils Relative 3.5  0.0 - 5.0 %   Basophils Relative 0.9  0.0 - 3.0 %   Neutro Abs 4.6  1.4 - 7.7 K/uL   Lymphs Abs 2.3  0.7 - 4.0 K/uL   Monocytes Absolute 0.8  0.1 - 1.0 K/uL   Eosinophils Absolute 0.3  0.0 - 0.7 K/uL   Basophils Absolute 0.1  0.0 - 0.1 K/uL  VITAMIN B12      Result Value Ref Range   Vitamin B-12 400  211 - 911 pg/mL  TSH      Result Value Ref Range   TSH 0.61  0.35 - 5.50 uIU/mL  COMPREHENSIVE METABOLIC PANEL       Result Value Ref Range   Sodium 141  135 - 145 mEq/L   Potassium 4.0  3.5 - 5.1 mEq/L   Chloride 109  96 - 112 mEq/L   CO2 25  19 - 32 mEq/L   Glucose, Bld 95  70 - 99 mg/dL   BUN 21  6 - 23 mg/dL   Creatinine, Ser 0.7  0.4 - 1.5 mg/dL   Total Bilirubin 1.7 (*) 0.3 - 1.2 mg/dL   Alkaline Phosphatase 49  39 - 117 U/L   AST 22  0 - 37 U/L   ALT 25  0 - 53 U/L   Total Protein 6.5  6.0 - 8.3 g/dL   Albumin 4.3  3.5 - 5.2 g/dL   Calcium 9.0  8.4 - 10.5 mg/dL   GFR 117.47  >60.00 mL/min  AMMONIA      Result Value Ref Range   Ammonia 33  16 - 53 umol/L      Assessment & Plan:   Problem List Items Addressed This Visit   Neck strain     Exam consistent with cervical neck strain. Treat with heating pad, robaxin, ok to continue aleve for now, provided with exercises from [facility] pt advisor. Update if not improving with this.    Medicare annual wellness visit, initial - Primary     I have personally reviewed the Medicare Annual Wellness questionnaire and have noted 1. The patient's medical and social history 2. Their use of alcohol, tobacco or illicit drugs 3. Their current medications and supplements 4. The patient's functional ability including ADL's, fall risks, home safety risks and hearing or visual impairment. 5. Diet and physical activity 6. Evidence for depression or mood disorders The patients weight, height, BMI have been recorded in the chart.  Hearing and vision has been addressed. I have made referrals, counseling and provided education to the patient based review of the above and I have provided the pt with a written personalized care plan for preventive services. See scanned questionairre. Advanced directives discussed: I've asked pt to bring me a copy   Reviewed preventative protocols and updated unless pt declined.    Health maintenance examination     Preventative protocols reviewed and updated unless pt declined. Discussed healthy diet and lifestyle.     Dyslipidemia      Due for recheck - return fasting for FLP    Relevant Orders      Lipid panel      Basic metabolic panel   Depression     Discussed slow taper. Will trial 50mg  dose - sent this in.    Relevant Medications      sertraline (ZOLOFT) tablet   Atrial fibrillation     Chronic stable. Suggested he refill dilt.    Relevant Medications      aspirin 81 MG tablet       Follow up plan: Return in about 1 year (around 03/04/2015), or as needed, for annual exam, prior fasting for blood work.

## 2014-03-03 NOTE — Progress Notes (Signed)
Pre visit review using our clinic review tool, if applicable. No additional management support is needed unless otherwise documented below in the visit note. 

## 2014-03-03 NOTE — Patient Instructions (Addendum)
Let's decrease sertraline to 50mg  daily. Trial of lower dose to see how mood does Call your insurance about the shingles shot to see if it is covered or how much it would cost and where is cheaper (here or pharmacy).  If you want to receive here, call for nurse visit. prevnar today (second pneumonia shot) Sign release for records of colonoscopy with [facility]. For neck - try exercises provided today, heating pad and muscle relaxant (sedation precautions). Ok to continue [place] for now. Return at your convenience fasting for blood work Good to see you today,call us with questions. Return as needed or in 1 year for next wellness exam.

## 2014-03-03 NOTE — Assessment & Plan Note (Signed)
Exam consistent with cervical neck strain. Treat with heating pad, robaxin, ok to continue aleve for now, provided with exercises from [facility] pt advisor. Update if not improving with this.

## 2014-03-03 NOTE — Assessment & Plan Note (Signed)
I have personally reviewed the Medicare Annual Wellness questionnaire and have noted 1. The patient's medical and social history 2. Their use of alcohol, tobacco or illicit drugs 3. Their current medications and supplements 4. The patient's functional ability including ADL's, fall risks, home safety risks and hearing or visual impairment. 5. Diet and physical activity 6. Evidence for depression or mood disorders The patients weight, height, BMI have been recorded in the chart.  Hearing and vision has been addressed. I have made referrals, counseling and provided education to the patient based review of the above and I have provided the pt with a written personalized care plan for preventive services. See scanned questionairre. Advanced directives discussed: I've asked pt to bring me a copy   Reviewed preventative protocols and updated unless pt declined.

## 2014-03-03 NOTE — Assessment & Plan Note (Signed)
Chronic stable. Suggested he refill dilt.

## 2014-03-03 NOTE — Assessment & Plan Note (Signed)
Discussed slow taper. Will trial 50mg  dose - sent this in.

## 2014-03-04 NOTE — Telephone Encounter (Signed)
PA denied. In your IN box for review.

## 2014-03-08 NOTE — Telephone Encounter (Signed)
Noted. Would still have pt try this if affordable. Otherwise continue aleve and add exercises provided, ice or heating pad. Lab Results  Component Value Date   CREATININE 0.7 10/15/2013

## 2014-03-21 ENCOUNTER — Telehealth: Payer: Self-pay | Admitting: Gastroenterology

## 2014-03-28 ENCOUNTER — Telehealth: Payer: Self-pay | Admitting: Gastroenterology

## 2014-03-28 NOTE — Telephone Encounter (Signed)
Rec'd from [facility] forward 5 pages to [place]

## 2014-04-19 ENCOUNTER — Encounter: Payer: Self-pay | Admitting: Family Medicine

## 2014-05-20 ENCOUNTER — Other Ambulatory Visit: Payer: Self-pay | Admitting: Internal Medicine

## 2014-08-04 ENCOUNTER — Encounter: Payer: Self-pay | Admitting: Family Medicine

## 2014-08-04 ENCOUNTER — Ambulatory Visit (INDEPENDENT_AMBULATORY_CARE_PROVIDER_SITE_OTHER): Payer: Medicare Other | Admitting: Family Medicine

## 2014-08-04 ENCOUNTER — Other Ambulatory Visit: Payer: Self-pay | Admitting: Internal Medicine

## 2014-08-04 VITALS — BP 112/78 | HR 62 | Temp 97.8°F | Wt 166.2 lb

## 2014-08-04 DIAGNOSIS — J029 Acute pharyngitis, unspecified: Secondary | ICD-10-CM

## 2014-08-04 DIAGNOSIS — I4891 Unspecified atrial fibrillation: Secondary | ICD-10-CM

## 2014-08-04 MED ORDER — AZITHROMYCIN 250 MG PO TABS: ORAL_TABLET | ORAL | Status: DC

## 2014-08-04 MED ORDER — CEPHALEXIN 500 MG PO CAPS: 500.0000 mg | ORAL_CAPSULE | Freq: Four times a day (QID) | ORAL | Status: DC

## 2014-08-04 NOTE — Assessment & Plan Note (Signed)
Pt due for f/u with cards. Will call and schedule.

## 2014-08-04 NOTE — Progress Notes (Signed)
Pre visit review using our clinic review tool, if applicable. No additional management support is needed unless otherwise documented below in the visit note. 

## 2014-08-04 NOTE — Patient Instructions (Signed)
Call Dr Rayann Heman for follow up appointment as you're due. You have pharyngitis, likely viral. Push fluids and plenty of rest. May use plain mucinex or immediate release guaifenesin with plenty of water to help mobilize mucous. Salt water gargles. If not improving as expected or new fever >101, fill antibiotic prescription provided today Good to see you today, call clinic with questions. Let us know if not better with this.

## 2014-08-04 NOTE — Assessment & Plan Note (Signed)
Anticipate viral Supportive care discussed. WASP for keflex provided in case fever >101 or persistent sxs into next week.

## 2014-08-04 NOTE — Progress Notes (Signed)
BP 112/78 mmHg  Pulse 62  Temp(Src) 97.8 F (36.6 C) (Oral)  Wt 166 lb 4 oz (75.411 kg)  SpO2 98%   CC: URI sxs  Subjective:    Patient ID: Ernest Bell, male    DOB: March 13, 1946, 68 y.o.   MRN: [id number]  HPI: Ernest Bell is a 68 y.o. 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 +head congestion.   So far has tried cough drops which aren't help.  No fevers/chills, abd pain, ear or tooth pain, new rashes, headaches.  No other sick contacts at home. No smokers at home. No h/o asthma, COPD.  Relevant past medical, surgical, family and social history reviewed and updated as indicated. Interim medical history since our last visit reviewed. Allergies and medications reviewed and updated.  Current Outpatient Prescriptions on File Prior to Visit  Medication Sig  . aspirin 81 MG tablet Take 81 mg by mouth daily.  Marland Kitchen diltiazem (CARDIZEM CD) 120 MG 24 hr capsule Take 1 capsule (120 mg total) by mouth daily.  . flecainide (TAMBOCOR) 50 MG tablet TAKE 1 TABLET BY MOUTH TWICE DAILY  . naproxen sodium (ANAPROX) 220 MG tablet Take 220 mg by mouth 2 (two) times daily.  . Tamsulosin HCl (FLOMAX) 0.4 MG CAPS Take 0.4 mg by mouth 2 (two) times daily.    No current facility-administered medications on file prior to visit.    Review of Systems Per HPI unless specifically indicated above     Objective:    BP 112/78 mmHg  Pulse 62  Temp(Src) 97.8 F (36.6 C) (Oral)  Wt 166 lb 4 oz (75.411 kg)  SpO2 98%  Wt Readings from Last 3 Encounters:  08/04/14 166 lb 4 oz (75.411 kg)  03/03/14 170 lb 12 oz (77.452 kg)  11/14/13 171 lb (77.565 kg)    Physical Exam  Constitutional: He appears well-developed and well-nourished. No distress.  HENT:  Head: Normocephalic and atraumatic.  Right Ear: Hearing, tympanic membrane, external ear and ear  canal normal.  Left Ear: Hearing, tympanic membrane, external ear and ear canal normal.  Nose: Nose normal. No mucosal edema or rhinorrhea. Right sinus exhibits no maxillary sinus tenderness and no frontal sinus tenderness. Left sinus exhibits no maxillary sinus tenderness and no frontal sinus tenderness.  Mouth/Throat: Uvula is midline and mucous membranes are normal. Posterior oropharyngeal erythema (mild) present. No oropharyngeal exudate, posterior oropharyngeal edema or tonsillar abscesses.  Eyes: Conjunctivae and EOM are normal. Pupils are equal, round, and reactive to light. No scleral icterus.  Neck: Normal range of motion. Neck supple.  Cardiovascular: Normal rate, normal heart sounds and intact distal pulses.  An irregularly irregular rhythm present.  No murmur heard. Pulmonary/Chest: Effort normal and breath sounds normal. No respiratory distress. He has no wheezes. He has no rales.  Lymphadenopathy:    He has no cervical adenopathy.  Skin: Skin is warm and dry. No rash noted.  Nursing note and vitals reviewed.  Results for orders placed or performed in visit on 10/15/13  CBC with Differential  Result Value Ref Range   WBC 8.0 4.5 - 10.5 K/uL   RBC 4.57 4.22 - 5.81 Mil/uL   Hemoglobin 14.1 13.0 - 17.0 g/dL   HCT 42.1 39.0 - 52.0 %   MCV 92.1 78.0 - 100.0 fl   MCHC 33.6 30.0 - 36.0 g/dL  RDW 13.1 11.5 - 14.6 %   Platelets 196.0 150.0 - 400.0 K/uL   Neutrophils Relative % 57.6 43.0 - 77.0 %   Lymphocytes Relative 28.6 12.0 - 46.0 %   Monocytes Relative 9.4 3.0 - 12.0 %   Eosinophils Relative 3.5 0.0 - 5.0 %   Basophils Relative 0.9 0.0 - 3.0 %   Neutro Abs 4.6 1.4 - 7.7 K/uL   Lymphs Abs 2.3 0.7 - 4.0 K/uL   Monocytes Absolute 0.8 0.1 - 1.0 K/uL   Eosinophils Absolute 0.3 0.0 - 0.7 K/uL   Basophils Absolute 0.1 0.0 - 0.1 K/uL  Vitamin B12  Result Value Ref Range   Vitamin B-12 400 211 - 911 pg/mL  TSH  Result Value Ref Range   TSH 0.61 0.35 - 5.50 uIU/mL    Comprehensive metabolic panel  Result Value Ref Range   Sodium 141 135 - 145 mEq/L   Potassium 4.0 3.5 - 5.1 mEq/L   Chloride 109 96 - 112 mEq/L   CO2 25 19 - 32 mEq/L   Glucose, Bld 95 70 - 99 mg/dL   BUN 21 6 - 23 mg/dL   Creatinine, Ser 0.7 0.4 - 1.5 mg/dL   Total Bilirubin 1.7 (H) 0.3 - 1.2 mg/dL   Alkaline Phosphatase 49 39 - 117 U/L   AST 22 0 - 37 U/L   ALT 25 0 - 53 U/L   Total Protein 6.5 6.0 - 8.3 g/dL   Albumin 4.3 3.5 - 5.2 g/dL   Calcium 9.0 8.4 - 10.5 mg/dL   GFR 117.47 >60.00 mL/min  AMMONIA  Result Value Ref Range   Ammonia 33 16 - 53 umol/L      Assessment & Plan:   Problem List Items Addressed This Visit    Atrial fibrillation    Pt due for f/u with cards. Will call and schedule.    Acute pharyngitis - Primary    Anticipate viral Supportive care discussed. WASP for keflex provided in case fever >101 or persistent sxs into next week.        Follow up plan: Return if symptoms worsen or fail to improve.

## 2014-09-05 HISTORY — PX: KNEE ARTHROSCOPY: SHX127

## 2014-09-18 ENCOUNTER — Other Ambulatory Visit: Payer: Self-pay | Admitting: Orthopedic Surgery

## 2014-09-18 DIAGNOSIS — M4726 Other spondylosis with radiculopathy, lumbar region: Secondary | ICD-10-CM

## 2015-02-17 ENCOUNTER — Other Ambulatory Visit: Payer: Self-pay | Admitting: Internal Medicine

## 2015-02-20 ENCOUNTER — Encounter: Payer: Self-pay | Admitting: Family Medicine

## 2015-02-20 DIAGNOSIS — K760 Fatty (change of) liver, not elsewhere classified: Secondary | ICD-10-CM | POA: Insufficient documentation

## 2015-02-20 DIAGNOSIS — K7581 Nonalcoholic steatohepatitis (NASH): Secondary | ICD-10-CM

## 2015-02-20 DIAGNOSIS — K7460 Unspecified cirrhosis of liver: Secondary | ICD-10-CM | POA: Insufficient documentation

## 2015-06-08 ENCOUNTER — Other Ambulatory Visit: Payer: Self-pay | Admitting: Internal Medicine

## 2015-06-10 ENCOUNTER — Telehealth: Payer: Self-pay | Admitting: Internal Medicine

## 2015-06-10 NOTE — Telephone Encounter (Signed)
Patient c/o Palpitations:  High priority if patient c/o lightheadedness and shortness of breath.  1. How long have you been having palpitations? Several weeks  2. Are you currently experiencing lightheadedness and shortness of breath? No  3. Have you checked your BP and heart rate? (document readings) No  4. Are you experiencing any other symptoms? Retaining fluid in abdomen

## 2015-06-11 NOTE — Telephone Encounter (Signed)
Called and left a message for patient's wife to return my call, but that Dr Rayann Heman was wanting him to be set up with an extender and then referred to a general Cardiologist.          Discussed with Dr Rayann Heman and he said to put on schedule with flex and have patient establish with general cardiology  Lenna Sciara is calling patient to schedule

## 2015-06-12 ENCOUNTER — Encounter: Payer: Self-pay | Admitting: Physician Assistant

## 2015-06-12 ENCOUNTER — Ambulatory Visit (INDEPENDENT_AMBULATORY_CARE_PROVIDER_SITE_OTHER): Payer: Medicare Other | Admitting: Physician Assistant

## 2015-06-12 VITALS — BP 134/72 | HR 78 | Ht 65.5 in | Wt 178.0 lb

## 2015-06-12 DIAGNOSIS — R140 Abdominal distension (gaseous): Secondary | ICD-10-CM

## 2015-06-12 DIAGNOSIS — I4891 Unspecified atrial fibrillation: Secondary | ICD-10-CM

## 2015-06-12 MED ORDER — FLECAINIDE ACETATE 50 MG PO TABS: 50.0000 mg | ORAL_TABLET | Freq: Two times a day (BID) | ORAL | Status: DC

## 2015-06-12 MED ORDER — DILTIAZEM HCL ER COATED BEADS 120 MG PO CP24: 120.0000 mg | ORAL_CAPSULE | Freq: Every day | ORAL | Status: DC

## 2015-06-12 NOTE — Progress Notes (Signed)
Cardiology Office Note    Date:  06/12/2015   ID:  Ernest Bell, DOB [birth year], MRN [id number]  PCP:  Ria Bush, MD  Cardiologist: None  Electrophysiologist:  Dr. Rayann Heman   History of Present Illness: Ernest Bell is a 69 y.o. 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 Per phone notes, Dr. Rayann Heman would like him to establish with general cardiology  He was on flecainide 50mg  BID and cardizem 120mg .Seen by Dr. Rayann Heman in 10/2013. He complained of SOB and fatigue. He underwent GXT myoview on 11/15/13 which was normal. chads2vasc score is 1 so anticoagulation felt not to be indicated.    Today he presents to discuss palpitations and worsening abdominal distension. He get 1-2 minutes of palpitations at night. No associated CP or SOB. No syncope. They just come and go and are a little bothersome. It feels like his heart just "stops." He has always had some abdominal distension, but per wife- it has been much worse lately. No SOB, LE edema, orthopnea or PND.  He has been taking his flecainide but not the diltiazem because he thinks he forgot to fill it- but doesn't remember.  Studies  - Echo (10/01/13):  EF 55-60%, mild LVH, no WMA. G2DD. Mild MR, mild LA dilation.  - Nuclear (11/15/13):  No evidence of ischemia: EF 65%    Recent Labs/Images:  No results for input(s): NA, K, BUN, CREATININE, ALT, HGB, TSH, LDLCALC, LDLDIRECT, HDL, BNP, PROBNP in the last 8760 hours.  Invalid input(s): LDL   Mr Brain Wo Contrast  10/24/2013   CLINICAL DATA:  Subacute confusion.  Rule out CVA  EXAM: MRI HEAD WITHOUT CONTRAST  TECHNIQUE: Multiplanar, multiecho pulse sequences of the brain and surrounding structures were obtained without intravenous contrast.  COMPARISON:  MRI 04/27/2012  FINDINGS: Mild to moderate hyperintensities in the  cerebral white matter bilaterally are stable. Brainstem and cerebellum are normal.  Negative for acute infarct. Negative for mass lesion. Chronic micro hemorrhage in the left periventricular white matter and right thalamus unchanged. Question history of hypertension. No fluid collection or midline shift.  Paranasal sinuses are clear.  IMPRESSION: Chronic microvascular ischemic change and mild chronic micro hemorrhage. No acute abnormality and no change from the prior MRI.   Electronically Signed   By: Franchot Gallo M.D.   On: 10/24/2013 09:03     Wt Readings from Last 3 Encounters:  08/04/14 166 lb 4 oz (75.411 kg)  03/03/14 170 lb 12 oz (77.452 kg)  11/14/13 171 lb (77.565 kg)     Past Medical History  Diagnosis Date  . Persistent atrial fibrillation ([place]) 09/15/10    S/P PVI (Cryo) at [facility]  . Atrial fib/flutter, transient 04/28/10    typical appearing atrial flutter dx 04/28/10 s/p CTI ablation 10/11  . 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 Depression     on sertraline  . DJD (degenerative joint disease)   . BPH (benign prostatic hyperplasia)     PSA stable, followed by Dr. Eliberto Ivory  . Liver cirrhosis secondary to NASH (nonalcoholic steatohepatitis) 2001    ?by liver biopsy  . Syncope 1997    recurrent S/P tilt study by Dr. [name] (like due to dysautonomia)  . Gout     Current Outpatient Prescriptions  Medication Sig Dispense Refill  . aspirin 81 MG tablet Take  81 mg by mouth daily.    . cephALEXin (KEFLEX) 500 MG capsule Take 1 capsule (500 mg total) by mouth 4 (four) times daily. 20 capsule 0  . diltiazem (CARDIZEM CD) 120 MG 24 hr capsule Take 1 capsule (120 mg total) by mouth daily. 90 capsule 3  . flecainide (TAMBOCOR) 50 MG tablet TAKE 1 TABLET BY MOUTH TWICE DAILY 60 tablet 0  . naproxen sodium (ANAPROX) 220 MG tablet Take 220 mg by mouth 2 (two) times daily.    . Tamsulosin HCl (FLOMAX) 0.4 MG CAPS Take 0.4 mg by mouth 2 (two) times daily.        No current facility-administered medications for this visit.     Allergies:   Penicillins   Social History:  The patient  reports that he has never smoked. He has never used smokeless tobacco. He reports that he does not drink alcohol or use illicit drugs.   Family History:  The patient's family history includes Alcohol abuse in his father; Heart disease in his father; Hypertension in his mother.   ROS:  Please see the history of present illness.  All other systems reviewed and negative.    PHYSICAL EXAM: VS:  There were no vitals taken for this visit. Well nourished, well developed, in no acute distress HEENT: normal Neck: no JVD Cardiac:  normal S1, S2; RRR; no murmur Lungs: clear to auscultation bilaterally, no wheezing, rhonchi or rales Abd: soft, nontender, no hepatomegaly. Mildly distended Ext: no edema Skin: warm and dry Neuro:  CNs 2-12 intact, no focal abnormalities noted  EKG:  Sinus rhythm with occasional PVC. HR 78   ASSESSMENT AND PLAN:  Ernest Bell is a 69 y.o. 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 Per phone notes, Dr. Rayann Heman would like him to establish with general cardiology  PAF- today he is in NSR w/ PVC.  -- Continue flecainide 50mg  BID. He stopped taking cardizem 120mg  for unknown reasons ( thinks he forgot to fill it). We will restart this today -- Chads2vasc score is 1 (age) so anticoagulation felt not to be indicated.   Palpitations- these could be afib or PVCs. I suggested he wear a heart monitor but he does not want to do this. He would just like to restart diltiazem as above.   Abdominal distention- patient has a history of cirrhosis. 2D ECHO with normal LV function but some diastolic dysfunction in 10/6201. I will repeat an ECHO to r/o CHF as this would change his CHADSVASC score  and he may require anticoagulation.  NASH- He will follow up with PCP about cirrhosis as this could be the cause of his abdominal distension  Disposition:  FU with me on 08/12/15. If ECHO okay then I think he would be a good patient to follow in the afib clinic as this is his only issue.    Signed, Vesta Mixer, PA-C, MHS 06/12/2015 7:06 AM    [facility] [place], [place], [place]  [address] Phone: [phone]; Fax: [phone]

## 2015-06-12 NOTE — Patient Instructions (Addendum)
Medication Instructions:   START TAKING  DILTIAZEM 120 MG ONCE A DAY   REFILLS SENT IN TO PHARMACY   Labwork:  NONE ORDER TODAY  Testing/Procedures:  Your physician has requested that you have an [place] 08/12/15 Echocardiography is a painless test that uses sound waves to create images of your heart. It provides your doctor with information about the size and shape of your heart and how well your heart's chambers and valves are working. This procedure takes approximately one hour. There are no restrictions for this procedure.  Follow-Up:  WITH THOMPSON KATHRYN PA ON 08/12/15   Any Other Special Instructions Will Be Listed Below (If Applicable).

## 2015-06-30 ENCOUNTER — Other Ambulatory Visit: Payer: Self-pay | Admitting: Orthopedic Surgery

## 2015-06-30 DIAGNOSIS — M25561 Pain in right knee: Secondary | ICD-10-CM

## 2015-07-03 ENCOUNTER — Ambulatory Visit
Admission: RE | Admit: 2015-07-03 | Discharge: 2015-07-03 | Disposition: A | Discharge status: Home / Self Care | Payer: Medicare Other | Source: Ambulatory Visit | Attending: Orthopedic Surgery | Admitting: Orthopedic Surgery

## 2015-07-03 DIAGNOSIS — M25561 Pain in right knee: Secondary | ICD-10-CM

## 2015-07-23 ENCOUNTER — Encounter: Payer: Self-pay | Admitting: Internal Medicine

## 2015-07-23 ENCOUNTER — Ambulatory Visit (INDEPENDENT_AMBULATORY_CARE_PROVIDER_SITE_OTHER): Payer: Medicare Other | Admitting: Internal Medicine

## 2015-07-23 VITALS — BP 124/72 | HR 72 | Temp 98.3°F | Wt 173.5 lb

## 2015-07-23 DIAGNOSIS — J029 Acute pharyngitis, unspecified: Secondary | ICD-10-CM

## 2015-07-23 LAB — POCT RAPID STREP A (OFFICE): Rapid Strep A Screen: NEGATIVE

## 2015-07-23 NOTE — Assessment & Plan Note (Addendum)
History doesn't suggest strep  Rapid test negative Discussed symptomatic care with analgesics

## 2015-07-23 NOTE — Progress Notes (Signed)
Pre visit review using our clinic review tool, if applicable. No additional management support is needed unless otherwise documented below in the visit note. 

## 2015-07-23 NOTE — Progress Notes (Signed)
Subjective:    Patient ID: Ernest Bell, male    DOB: 1946-07-01, 69 y.o.   MRN: [id number]  HPI Started with sore throat yesterday Concerned due to no pericardium  Hurts with swallowing No fever No glands in neck Slight cough  Some post nasal drip and congestion in head No ear pain  No Rx for this  Current Outpatient Prescriptions on File Prior to Visit  Medication Sig Dispense Refill  . aspirin 81 MG tablet Take 81 mg by mouth daily.    Marland Kitchen diltiazem (CARDIZEM CD) 120 MG 24 hr capsule Take 1 capsule (120 mg total) by mouth daily. 90 capsule 3  . flecainide (TAMBOCOR) 50 MG tablet Take 1 tablet (50 mg total) by mouth 2 (two) times daily. 60 tablet 9  . Tamsulosin HCl (FLOMAX) 0.4 MG CAPS Take 0.4 mg by mouth 2 (two) times daily.      No current facility-administered medications on file prior to visit.    Allergies  Allergen Reactions  . Penicillins Rash    Past Medical History  Diagnosis Date  . Persistent atrial fibrillation ([place]) 09/15/10    S/P PVI (Cryo) at [facility]  . Atrial fib/flutter, transient 04/28/10    typical appearing atrial flutter dx 04/28/10 s/p CTI ablation 10/11  . 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 Depression     on sertraline  . DJD (degenerative joint disease)   . BPH (benign prostatic hyperplasia)     PSA stable, followed by Dr. Eliberto Ivory  . Liver cirrhosis secondary to NASH (nonalcoholic steatohepatitis) 2001    ?by liver biopsy  . Syncope 1997    recurrent S/P tilt study by Dr. [name] (like due to dysautonomia)  . Gout     Past Surgical History  Procedure Laterality Date  . Pericardioectomy  1969    at [facility]  . Knee surgery  2007    he had osteoarthritis with left knee surgery  . Hemorroidectomy  2007  . Cholecystectomy  1995  . Right thumb surger      for osteoarthritis  . Afib abation  09/15/10    Cryo at [place], CTI ablation at [facility] 04/28/10  . Colonoscopy  02/2000    2  hyperplastic polyps, [name]  . Esophagogastroduodenoscopy  02/2000    [name]    Family History  Problem Relation Age of Onset  . Hypertension Mother   . Alcohol abuse Father     smoker  . Heart disease Father     MI (smoker)    Social History   Social History  . Marital Status: Married    Spouse Name: N/A  . Number of Children: N/A  . Years of Education: N/A   Occupational History  . Disabled Education officer, museum     previously worked as a Higher education careers adviser   Social History Main Topics  . Smoking status: Never Smoker   . Smokeless tobacco: Never Used  . Alcohol Use: No  . Drug Use: No  . Sexual Activity: Not on file   Other Topics Concern  . 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   Diet: good water, fruits/vegetables daily   Review of Systems  Grandson had illness this weekend and they  were fishing this weekend No vomiting or diarrhea Appetite okay No rash     Objective:   Physical Exam  Constitutional: He appears well-developed and well-nourished. No distress.  HENT:  Slight pharyngeal injection Tonsils 1+ without exudates TMs normal Mild nasal inflammation  Neck: Normal range of motion. Neck supple. No thyromegaly present.  Pulmonary/Chest: Effort normal and breath sounds normal. No respiratory distress. He has no wheezes. He has no rales.  Lymphadenopathy:    He has no cervical adenopathy.  Skin: No rash noted.          Assessment & Plan:

## 2015-08-05 ENCOUNTER — Other Ambulatory Visit (HOSPITAL_COMMUNITY): Payer: Medicare Other

## 2015-08-11 ENCOUNTER — Ambulatory Visit: Payer: Medicare Other | Admitting: Physician Assistant

## 2015-09-06 HISTORY — PX: TRANSURETHRAL MICROWAVE THERAPY: SUR1394

## 2015-09-06 HISTORY — PX: CATARACT EXTRACTION: SUR2

## 2016-04-15 ENCOUNTER — Ambulatory Visit (INDEPENDENT_AMBULATORY_CARE_PROVIDER_SITE_OTHER): Payer: Medicare Other

## 2016-04-15 ENCOUNTER — Encounter: Payer: Self-pay | Admitting: Family Medicine

## 2016-04-15 ENCOUNTER — Ambulatory Visit (INDEPENDENT_AMBULATORY_CARE_PROVIDER_SITE_OTHER): Payer: Medicare Other | Admitting: Family Medicine

## 2016-04-15 VITALS — BP 110/70 | HR 74 | Temp 97.4°F | Ht 64.5 in | Wt 164.2 lb

## 2016-04-15 DIAGNOSIS — Z1159 Encounter for screening for other viral diseases: Secondary | ICD-10-CM

## 2016-04-15 DIAGNOSIS — I311 Chronic constrictive pericarditis: Secondary | ICD-10-CM

## 2016-04-15 DIAGNOSIS — E785 Hyperlipidemia, unspecified: Secondary | ICD-10-CM

## 2016-04-15 DIAGNOSIS — K7581 Nonalcoholic steatohepatitis (NASH): Secondary | ICD-10-CM

## 2016-04-15 DIAGNOSIS — R5382 Chronic fatigue, unspecified: Secondary | ICD-10-CM

## 2016-04-15 DIAGNOSIS — K7460 Unspecified cirrhosis of liver: Secondary | ICD-10-CM

## 2016-04-15 DIAGNOSIS — I4891 Unspecified atrial fibrillation: Secondary | ICD-10-CM

## 2016-04-15 DIAGNOSIS — Z0000 Encounter for general adult medical examination without abnormal findings: Secondary | ICD-10-CM | POA: Diagnosis not present

## 2016-04-15 LAB — CBC WITH DIFFERENTIAL/PLATELET
Basophils Absolute: 0 10*3/uL (ref 0.0–0.1)
Basophils Relative: 0.4 % (ref 0.0–3.0)
Eosinophils Absolute: 0.2 10*3/uL (ref 0.0–0.7)
Eosinophils Relative: 2 % (ref 0.0–5.0)
HCT: 43 % (ref 39.0–52.0)
Hemoglobin: 14.8 g/dL (ref 13.0–17.0)
Lymphocytes Relative: 23.9 % (ref 12.0–46.0)
Lymphs Abs: 2 10*3/uL (ref 0.7–4.0)
MCHC: 34.4 g/dL (ref 30.0–36.0)
MCV: 90 fl (ref 78.0–100.0)
Monocytes Absolute: 0.7 10*3/uL (ref 0.1–1.0)
Monocytes Relative: 9 % (ref 3.0–12.0)
Neutro Abs: 5.4 10*3/uL (ref 1.4–7.7)
Neutrophils Relative %: 64.7 % (ref 43.0–77.0)
Platelets: 214 10*3/uL (ref 150.0–400.0)
RBC: 4.78 Mil/uL (ref 4.22–5.81)
RDW: 13.4 % (ref 11.5–15.5)
WBC: 8.3 10*3/uL (ref 4.0–10.5)

## 2016-04-15 LAB — COMPREHENSIVE METABOLIC PANEL
ALT: 47 U/L (ref 0–53)
AST: 32 U/L (ref 0–37)
Albumin: 4.8 g/dL (ref 3.5–5.2)
Alkaline Phosphatase: 56 U/L (ref 39–117)
BUN: 24 mg/dL — ABNORMAL HIGH (ref 6–23)
CO2: 25 mEq/L (ref 19–32)
Calcium: 9.6 mg/dL (ref 8.4–10.5)
Chloride: 108 mEq/L (ref 96–112)
Creatinine, Ser: 1.02 mg/dL (ref 0.40–1.50)
GFR: 76.76 mL/min (ref >60.00)
Glucose, Bld: 102 mg/dL — ABNORMAL HIGH (ref 70–99)
Potassium: 4.2 mEq/L (ref 3.5–5.1)
Sodium: 141 mEq/L (ref 135–145)
Total Bilirubin: 2.2 mg/dL — ABNORMAL HIGH (ref 0.2–1.2)
Total Protein: 7.1 g/dL (ref 6.0–8.3)

## 2016-04-15 LAB — PROTIME-INR
INR: 1.2 ratio — ABNORMAL HIGH (ref 0.8–1.0)
Prothrombin Time: 12.2 s (ref 9.6–13.1)

## 2016-04-15 LAB — VITAMIN B12: Vitamin B-12: 486 pg/mL (ref 211–911)

## 2016-04-15 NOTE — Patient Instructions (Signed)
Ernest Bell , Thank you for taking time to come for your Medicare Wellness Visit. I appreciate your ongoing commitment to your health goals. Please review the following plan we discussed and let me know if I can assist you in the future.   These are the goals we discussed: Goals    . Increase physical activity          Starting 04/15/2016, I will continue to exercise for at least 60 min daily.        This is a list of the screening recommended for you and due dates:  Health Maintenance  Topic Date Due  . Flu Shot  09/04/2016*  .  Hepatitis C: One time screening is recommended by Center for Disease Control  (CDC) for  adults born from [birth year] through 1965.   09/04/2016*  . Colon Cancer Screening  04/14/2017*  . DTaP/Tdap/Td vaccine (1 - Tdap) 03/26/2023*  . Tetanus Vaccine  03/26/2023  . Shingles Vaccine  Completed  . Pneumonia vaccines  Completed  *Topic was postponed. The date shown is not the original due date.   Preventive Care for Adults  A healthy lifestyle and preventive care can promote health and wellness. Preventive health guidelines for adults include the following key practices.  . A routine yearly physical is a good way to check with your health care provider about your health and preventive screening. It is a chance to share any concerns and updates on your health and to receive a thorough exam.  . Visit your dentist for a routine exam and preventive care every 6 months. Brush your teeth twice a day and floss once a day. Good oral hygiene prevents tooth decay and gum disease.  . The frequency of eye exams is based on your age, health, family medical history, use  of contact lenses, and other factors. Follow your health care provider's ecommendations for frequency of eye exams.  . Eat a healthy diet. Foods like vegetables, fruits, whole grains, low-fat dairy products, and lean protein foods contain the nutrients you need without too many calories. Decrease your intake of  foods high in solid fats, added sugars, and salt. Eat the right amount of calories for you. Get information about a proper diet from your health care provider, if necessary.  . Regular physical exercise is one of the most important things you can do for your health. Most adults should get at least 150 minutes of moderate-intensity exercise (any activity that increases your heart rate and causes you to sweat) each week. In addition, most adults need muscle-strengthening exercises on 2 or more days a week.  Silver Sneakers may be a benefit available to you. To determine eligibility, you may visit the website: www.silversneakers.com or contact program at (331)869-0239 Mon-Fri between 8AM-8PM.   . Maintain a healthy weight. The body mass index (BMI) is a screening tool to identify possible weight problems. It provides an estimate of body fat based on height and weight. Your health care provider can find your BMI and can help you achieve or maintain a healthy weight.   For adults 20 years and older: ? A BMI below 18.5 is considered underweight. ? A BMI of 18.5 to 24.9 is normal. ? A BMI of 25 to 29.9 is considered overweight. ? A BMI of 30 and above is considered obese.   . Maintain normal blood lipids and cholesterol levels by exercising and minimizing your intake of saturated fat. Eat a balanced diet with plenty of fruit and vegetables.  Blood tests for lipids and cholesterol should begin at age 55 and be repeated every 5 years. If your lipid or cholesterol levels are high, you are over 50, or you are at high risk for heart disease, you may need your cholesterol levels checked more frequently. Ongoing high lipid and cholesterol levels should be treated with medicines if diet and exercise are not working.  . If you smoke, find out from your health care provider how to quit. If you do not use tobacco, please do not start.  . If you choose to drink alcohol, please do not consume more than 2 drinks per  day. One drink is considered to be 12 ounces (355 mL) of beer, 5 ounces (148 mL) of wine, or 1.5 ounces (44 mL) of liquor.  . If you are 75-97 years old, ask your health care provider if you should take aspirin to prevent strokes.  . Use sunscreen. Apply sunscreen liberally and repeatedly throughout the day. You should seek shade when your shadow is shorter than you. Protect yourself by wearing long sleeves, pants, a wide-brimmed hat, and sunglasses year round, whenever you are outdoors.  . Once a month, do a whole body skin exam, using a mirror to look at the skin on your back. Tell your health care provider of new moles, moles that have irregular borders, moles that are larger than a pencil eraser, or moles that have changed in shape or color.

## 2016-04-15 NOTE — Assessment & Plan Note (Addendum)
Carries this diagnosis. Benign exam today. Update labs (CMP, CBC, PT/INR) and abd [place] today. Hep C screen today as well.

## 2016-04-15 NOTE — Patient Instructions (Signed)
I do agree we will re evaluate liver function with labs and abdominal ultrasound today. Pass by our referral coordinators for cardiology referral Return at previously scheduled physical appointment.

## 2016-04-15 NOTE — Progress Notes (Signed)
Subjective:   Ernest Bell is a 70 y.o. male who presents for Medicare Annual/Subsequent preventive examination.  Review of Systems:  N/A Cardiac Risk Factors include: advanced age (>1men, >28 women);male gender;dyslipidemia     Objective:    Vitals: BP 110/70 (BP Location: Left Arm, Patient Position: Sitting, Cuff Size: Normal)   Pulse 74   Temp 97.4 F (36.3 C) (Oral)   Ht 5' 4.5" (1.638 m) Comment: no shoes  Wt 164 lb 4 oz (74.5 kg)   SpO2 98%   BMI 27.76 kg/m   Body mass index is 27.76 kg/m.  Tobacco History  Smoking Status  . Never Smoker  Smokeless Tobacco  . Never Used     Counseling given: No   Past Medical History:  Diagnosis Date  . Atrial fib/flutter, transient 04/28/10   typical appearing atrial flutter dx 04/28/10 s/p CTI ablation 10/11  . BPH (benign prostatic hyperplasia)    PSA stable, followed by Dr. Eliberto Ivory  . 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 Depression    on sertraline  . DJD (degenerative joint disease)   . Gout   . Liver cirrhosis secondary to NASH (nonalcoholic steatohepatitis) 2001   ?by liver biopsy  . Persistent atrial fibrillation ([place]) 09/15/10   S/P PVI (Cryo) at [facility]  . Syncope 1997   recurrent S/P tilt study by Dr. [name] (like due to dysautonomia)   Past Surgical History:  Procedure Laterality Date  . afib abation  09/15/10   Cryo at [place], CTI ablation at [facility] 04/28/10  . CHOLECYSTECTOMY  1995  . COLONOSCOPY  02/2000   2 hyperplastic polyps, [name]  . ESOPHAGOGASTRODUODENOSCOPY  02/2000   [name]  . HEMORROIDECTOMY  2007  . KNEE SURGERY  2007   he had osteoarthritis with left knee surgery  . pericardioectomy  1969   at [facility]  . right thumb surger     for osteoarthritis   Family History  Problem Relation Age of Onset  . Hypertension Mother   . Alcohol abuse Father     smoker  . Heart disease Father     MI (smoker)   History  Sexual  Activity  . Sexual activity: Not on file    Outpatient Encounter Prescriptions as of 04/15/2016  Medication Sig  . aspirin 81 MG tablet Take 81 mg by mouth daily as needed.   . diltiazem (CARDIZEM CD) 120 MG 24 hr capsule Take 1 capsule (120 mg total) by mouth daily.  . flecainide (TAMBOCOR) 50 MG tablet Take 1 tablet (50 mg total) by mouth 2 (two) times daily.  . Tamsulosin HCl (FLOMAX) 0.4 MG CAPS Take 0.4 mg by mouth 2 (two) times daily.    No facility-administered encounter medications on file as of 04/15/2016.     Activities of Daily Living In your present state of health, do you have any difficulty performing the following activities: 04/15/2016  Hearing? N  Vision? N  Difficulty concentrating or making decisions? Y  Walking or climbing stairs? N  Dressing or bathing? N  Doing errands, shopping? N  Preparing Food and eating ? N  Using the Toilet? N  In the past six months, have you accidently leaked urine? N  Do you have problems with loss of bowel control? N  Managing your Medications? N  Managing your Finances? N  Housekeeping or managing your Housekeeping? N  Some recent data might be hidden    Patient Care Team:  Ria Bush, MD as PCP - General (Family Medicine) [name], MD as Consulting Physician (Cardiology) Royston Cowper, MD as Consulting Physician (Urology)   Assessment:     Hearing Screening   125Hz  250Hz  500Hz  1000Hz  2000Hz  3000Hz  4000Hz  6000Hz  8000Hz   Right ear:   40 40 40  0    Left ear:   40 0 0  0      Visual Acuity Screening   Right eye Left eye Both eyes  Without correction: 20/25 20/25 20/25   With correction:       Exercise Activities and Dietary recommendations Current Exercise Habits: Home exercise routine, Type of exercise: walking, Time (Minutes): 60, Frequency (Times/Week): 7, Weekly Exercise (Minutes/Week): 420, Intensity: Moderate, Exercise limited by: None identified  Goals    . Increase physical activity          Starting  04/15/2016, I will continue to exercise for at least 60 min daily.       Fall Risk Fall Risk  04/15/2016 03/03/2014  Falls in the past year? No No   Depression Screen PHQ 2/9 Scores 04/15/2016 03/03/2014  PHQ - 2 Score 0 0    Cognitive Testing MMSE - Mini Mental State Exam 04/15/2016  Orientation to time 5  Orientation to Place 5  Registration 3  Attention/ Calculation 0  Recall 3  Language- name 2 objects 0  Language- repeat 1  Language- follow 3 step command 3  Language- read & follow direction 0  Write a sentence 0  Copy design 0  Total score 20   PLEASE NOTE: A Mini-Cog screen was completed. Maximum score is 20. A value of 0 denotes this part of Folstein MMSE was not completed or the patient failed this part of the Mini-Cog screening.   Mini-Cog Screening Orientation to Time - Max 5 pts Orientation to Place - Max 5 pts Registration - Max 3 pts Recall - Max 3 pts Language Repeat - Max 1 pts Language Follow 3 Step Command - Max 3 pts  Immunization History  Administered Date(s) Administered  . Influenza Whole 05/07/2011  . Influenza, Seasonal, Injecte, Preservative Fre 08/18/2015  . Pneumococcal Conjugate-13 03/03/2014  . Pneumococcal Polysaccharide-23 07/26/2011  . Td 01/26/2010, 03/25/2013  . Zoster 08/18/2015   Screening Tests Health Maintenance  Topic Date Due  . INFLUENZA VACCINE  09/04/2016 (Originally 04/05/2016)  . Hepatitis C Screening  09/04/2016 (Originally May 05, 1946)  . COLONOSCOPY  04/14/2017 (Originally 02/15/2010)  . DTaP/Tdap/Td (1 - Tdap) 03/26/2023 (Originally 03/26/2013)  . TETANUS/TDAP  03/26/2023  . ZOSTAVAX  Completed  . PNA vac Low Risk Adult  Completed      Plan:     I have personally reviewed and addressed the Medicare Annual Wellness questionnaire and have noted the following in the patient's chart:  A. Medical and social history B. Use of alcohol, tobacco or illicit drugs  C. Current medications and supplements D. Functional ability  and status E.  Nutritional status F.  Physical activity G. Advance directives H. List of other physicians I.  Hospitalizations, surgeries, and ER visits in previous 12 months J.  [place] to include hearing, vision, cognitive, depression L. Referrals and appointments - none  In addition, I have reviewed and discussed with patient certain preventive protocols, quality metrics, and best practice recommendations. A written personalized care plan for preventive services as well as general preventive health recommendations were provided to patient.  See attached scanned questionnaire for additional information.   Signed,   Lindell Noe,  MHA, BS, LPN Health Advisor

## 2016-04-15 NOTE — Assessment & Plan Note (Signed)
Not fasting today. Check FLP at CPE labs.

## 2016-04-15 NOTE — Progress Notes (Signed)
BP 110/70 (BP Location: Left Arm, Patient Position: Sitting, Cuff Size: Normal)   Pulse 74   Temp 97.4 F (36.3 C) (Oral)   Ht 5' 4.5" (1.638 m) Comment: no shoes  Wt 164 lb 4 oz (74.5 kg)   SpO2 98%   BMI 27.76 kg/m    CC: abd swelling Subjective:    Patient ID: Ernest Bell, male    DOB: 18-Apr-1946, 70 y.o.   MRN: [id number]  HPI: Ernest Bell is a 70 y.o. male presenting on 04/15/2016 for No chief complaint on file.   Seen by Katha Cabal this morning for medicare wellness visit. Will review note when completed. Here with wife for acute visit to discuss. Last seen by me 07/2014.   Predominant concern is abdominal swelling that worsens as day progresses. No abd pain, fevers/chills, nausea/vomiting, diarrhea/constipation. Denies pedal edema.  Wife is more conerned than patient. He drinks lots of milk and attributes it to this.   9lb weight loss over last year. Overall stable since 2015.  He has increased walking over last several weeks  Carries diagnosis of liver cirrhosis due to NASH by biopsy 2001 - pt attributes this to h/o constrictive pericarditis.  OSA - doesn't use CPAP.   Requests referral to local cardiologist in [place].   Relevant past medical, surgical, family and social history reviewed and updated as indicated. Interim medical history since our last visit reviewed. Allergies and medications reviewed and updated. Current Outpatient Prescriptions on File Prior to Visit  Medication Sig  . aspirin 81 MG tablet Take 81 mg by mouth daily as needed.   . diltiazem (CARDIZEM CD) 120 MG 24 hr capsule Take 1 capsule (120 mg total) by mouth daily.  . flecainide (TAMBOCOR) 50 MG tablet Take 1 tablet (50 mg total) by mouth 2 (two) times daily.  . Tamsulosin HCl (FLOMAX) 0.4 MG CAPS Take 0.4 mg by mouth 2 (two) times daily.    No current facility-administered medications on file prior to visit.     Review of Systems Per HPI unless specifically indicated in ROS  section     Objective:    BP 110/70 (BP Location: Left Arm, Patient Position: Sitting, Cuff Size: Normal)   Pulse 74   Temp 97.4 F (36.3 C) (Oral)   Ht 5' 4.5" (1.638 m) Comment: no shoes  Wt 164 lb 4 oz (74.5 kg)   SpO2 98%   BMI 27.76 kg/m   Wt Readings from Last 3 Encounters:  04/15/16 164 lb 4 oz (74.5 kg)  04/15/16 164 lb 4 oz (74.5 kg)  07/23/15 173 lb 8 oz (78.7 kg)    Physical Exam  Constitutional: He appears well-developed and well-nourished. No distress.  HENT:  Mouth/Throat: Oropharynx is clear and moist. No oropharyngeal exudate.  Cardiovascular: Normal rate, regular rhythm, normal heart sounds and intact distal pulses.   No murmur heard. Pulmonary/Chest: Effort normal and breath sounds normal. No respiratory distress. He has no wheezes. He has no rales.  Abdominal: Soft. Normal appearance and bowel sounds are normal. He exhibits no distension and no mass. There is no hepatosplenomegaly. There is no tenderness. There is no rigidity, no rebound, no guarding, no CVA tenderness and negative Murphy's sign.  No HM appreciated.   Skin: Skin is warm and dry. No rash noted.  Psychiatric: He has a normal mood and affect.  Nursing note and vitals reviewed.     Assessment & Plan:   Problem List Items Addressed This Visit  Atrial fibrillation ([place])    Sounds regular today.      Constrictive pericarditis    Will refer to local cardiologist per pt/Dr Allred request.      Relevant Orders   Ambulatory referral to Cardiology   Dyslipidemia    Not fasting today. Check FLP at CPE labs.      Liver cirrhosis secondary to NASH (nonalcoholic steatohepatitis) - Primary    Carries this diagnosis. Benign exam today. Update labs (CMP, CBC, PT/INR) and abd [place] today. Hep C screen today as well.       Relevant Orders   Comprehensive metabolic panel   CBC with Differential/Platelet   Protime-INR    Other Visit Diagnoses    Chronic fatigue       Relevant Orders    Vitamin B12   US Abdomen Complete   Need for hepatitis C screening test       Relevant Orders   Hepatitis C antibody       Follow up plan: Return in about 2 months (around 06/15/2016) for annual exam, prior fasting for blood work.  Ria Bush, MD

## 2016-04-15 NOTE — Progress Notes (Signed)
Pre visit review using our clinic review tool, if applicable. No additional management support is needed unless otherwise documented below in the visit note. 

## 2016-04-15 NOTE — Assessment & Plan Note (Signed)
Sounds regular today.  ?

## 2016-04-15 NOTE — Assessment & Plan Note (Signed)
Will refer to local cardiologist per pt/Dr Allred request.

## 2016-04-15 NOTE — Progress Notes (Signed)
PCP notes:   Health maintenance:  Hep C screening - will complete with future labs Flu vaccine - addressed Colonoscopy - will discuss with PCP at next appt  Abnormal screenings:   Hearing - failed  Patient concerns:   None  Nurse concerns:  None  Next PCP appt:   04/15/16 @ 1015

## 2016-04-16 LAB — HEPATITIS C ANTIBODY: HCV Ab: NEGATIVE

## 2016-04-16 NOTE — Progress Notes (Signed)
I reviewed health advisor's note, was available for consultation, and agree with documentation and plan.  

## 2016-04-19 ENCOUNTER — Ambulatory Visit
Admission: RE | Admit: 2016-04-19 | Discharge: 2016-04-19 | Disposition: A | Discharge status: Home / Self Care | Payer: Medicare Other | Source: Ambulatory Visit | Attending: Family Medicine | Admitting: Family Medicine

## 2016-04-19 DIAGNOSIS — K7460 Unspecified cirrhosis of liver: Secondary | ICD-10-CM | POA: Insufficient documentation

## 2016-04-19 DIAGNOSIS — R5382 Chronic fatigue, unspecified: Secondary | ICD-10-CM

## 2016-04-30 NOTE — Progress Notes (Signed)
Cardiology Office Note   Date:  05/04/2016   ID:  Ernest Bell, DOB October 31, 1945, MRN [id number]  Referring Doctor:  Ria Bush, MD   Cardiologist:   Wende Bushy, MD   Reason for consultation:  Chief Complaint  Patient presents with  . Other    Hx Afib No complaints today. Meds reviewed verbally with pt.      History of Present Illness: Ernest Bell is a 70 y.o. male who presents for ffup for Afib.  Patient was last seen by general cardiology October 2016. He usually sees EP Dr. Rayann Heman. An echocardiogram was recommended on his last visit. Patient was unable to do it then.  Patient reports that he has been feeling well. He has had no recurrence of A. fib. No palpitations. He has been back on his diltiazem together with the flecainide.  No chest pains, shortness of breath, palpitations. No loss of consciousness. He has been fairly active, walking at least 3 miles every day. He continues to play tennis. He has no symptoms with any activity.   ROS:  Please see the history of present illness. Aside from mentioned under HPI, all other systems are reviewed and negative.     Past Medical History:  Diagnosis Date  . Atrial fib/flutter, transient 04/28/10   typical appearing atrial flutter dx 04/28/10 s/p CTI ablation 10/11  . BPH (benign prostatic hyperplasia)    PSA stable, followed by Dr. Eliberto Ivory  . CHF (congestive heart failure) ([place])   . 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 Coronary artery disease   . Depression    on sertraline  . DJD (degenerative joint disease)   . Gout   . Liver cirrhosis secondary to NASH (nonalcoholic steatohepatitis) 2001   ?by liver biopsy  . Persistent atrial fibrillation ([place]) 09/15/10   S/P PVI (Cryo) at [facility]  . Syncope 1997   recurrent S/P tilt study by Dr. [name] (like due to dysautonomia)    Past Surgical History:  Procedure Laterality Date  . afib abation  09/15/10   Cryo at [place], CTI ablation  at [facility] 04/28/10  . CHOLECYSTECTOMY  1995  . COLONOSCOPY  02/2000   2 hyperplastic polyps, [name]  . ESOPHAGOGASTRODUODENOSCOPY  02/2000   [name]  . HEMORROIDECTOMY  2007  . KNEE SURGERY  2007   he had osteoarthritis with left knee surgery  . pericardioectomy  1969   at [facility]  . right thumb surger     for osteoarthritis     reports that he has never smoked. He has never used smokeless tobacco. He reports that he does not drink alcohol or use drugs.   family history includes Alcohol abuse in his father; Heart disease in his father; Hypertension in his mother.   Outpatient Medications Prior to Visit  Medication Sig Dispense Refill  . aspirin 81 MG tablet Take 81 mg by mouth daily as needed.     . diltiazem (CARDIZEM CD) 120 MG 24 hr capsule Take 1 capsule (120 mg total) by mouth daily. 90 capsule 3  . flecainide (TAMBOCOR) 50 MG tablet Take 1 tablet (50 mg total) by mouth 2 (two) times daily. 60 tablet 9  . Tamsulosin HCl (FLOMAX) 0.4 MG CAPS Take 0.4 mg by mouth 2 (two) times daily.      No facility-administered medications prior to visit.      Allergies: Penicillins    PHYSICAL EXAM: VS:  BP 118/60 (BP  Location: Right Arm, Patient Position: Sitting, Cuff Size: Normal)   Pulse 61   Ht 5\' 4"  (1.626 m)   Wt 166 lb 8 oz (75.5 kg)   BMI 28.58 kg/m  , Body mass index is 28.58 kg/m. Wt Readings from Last 3 Encounters:  05/04/16 166 lb 8 oz (75.5 kg)  04/15/16 164 lb 4 oz (74.5 kg)  04/15/16 164 lb 4 oz (74.5 kg)    GENERAL:  well developed, well nourished, not in acute distress HEENT: normocephalic, pink conjunctivae, anicteric sclerae, no xanthelasma, normal dentition, oropharynx clear NECK:  no neck vein engorgement, JVP normal, no hepatojugular reflux, carotid upstroke brisk and symmetric, no bruit, no thyromegaly, no lymphadenopathy LUNGS:  good respiratory effort, clear to auscultation bilaterally CV:  PMI not displaced, no  thrills, no lifts, S1 and S2 within normal limits, no palpable S3 or S4, no murmurs, no rubs, no gallops ABD:  Soft, nontender, nondistended, normoactive bowel sounds, no abdominal aortic bruit, no hepatomegaly, no splenomegaly MS: nontender back, no kyphosis, no scoliosis, no joint deformities EXT:  2+ DP/PT pulses, no edema, no varicosities, no cyanosis, no clubbing SKIN: warm, nondiaphoretic, normal turgor, no ulcers NEUROPSYCH: alert, oriented to person, place, and time, sensory/motor grossly intact, normal mood, appropriate affect  Recent Labs: 04/15/2016: ALT 47; BUN 24; Creatinine, Ser 1.02; Hemoglobin 14.8; Platelets 214.0; Potassium 4.2; Sodium 141   Lipid Panel    Component Value Date/Time   CHOL 205 (H) 05/24/2012 0806   TRIG 203.0 (H) 08/31/2010 0829   HDL 37.70 (L) 08/31/2010 0829   CHOLHDL 5 08/31/2010 0829   VLDL 40.6 (H) 08/31/2010 0829   LDLCALC 120 (H) 07/09/2008 1139   LDLDIRECT 117.8 08/31/2010 0829     Other studies Reviewed:  EKG:  The ekg from 05/04/2016 was personally reviewed by me and it revealed sinus rhythm, 61 BPM. Sinus arrhythmia. QRS 82 ms. QT and QTC within normal limits.  Additional studies/ records that were reviewed personally reviewed by me today include:  Nuclear stress test 11/14/2013: Overall Impression:  Normal stress nuclear study. LV Ejection Fraction: 65%.  LV Wall Motion:  NL LV Function; NL Wall Motion   ASSESSMENT AND PLAN: Paroxysmal Atrial fibrillation status post ablation in 04/2010, 06/2010 Currently in sinus rhythm. Continue flecainide and AV nodal blocking agent Cardizem. CHADS2-VASc=1. Recommend aspirin 325 mg by mouth daily. Follow-up on echocardiogram. Patient was last seen by Dr. Rayann Heman 10/30/2013. He is due for a follow-up.  History of constrictive pericarditis status post pericardectomy 1969 No issues with edema, shortness of breath. No evidence of congestive heart failure. Follow up echocardiogram.   Current  medicines are reviewed at length with the patient today.  The patient does not have concerns regarding medicines.  Labs/ tests ordered today include:  Orders Placed This Encounter  Procedures  . EKG 12-Lead  . ECHOCARDIOGRAM COMPLETE    I had a lengthy and detailed discussion with the patient regarding diagnoses, prognosis, diagnostic options, treatment options, and side effects of medications.   I counseled the patient on importance of lifestyle modification including heart healthy diet, regular physical activity.   Disposition:   FU with undersigned In one year    Signed, Wende Bushy, MD  05/04/2016 10:51 AM    [place]  This note was generated in part with voice recognition software and I apologize for any typographical errors that were not detected and corrected.

## 2016-05-04 ENCOUNTER — Ambulatory Visit (INDEPENDENT_AMBULATORY_CARE_PROVIDER_SITE_OTHER): Payer: Medicare Other | Admitting: Cardiology

## 2016-05-04 ENCOUNTER — Encounter: Payer: Self-pay | Admitting: Cardiology

## 2016-05-04 VITALS — BP 118/60 | HR 61 | Ht 64.0 in | Wt 166.5 lb

## 2016-05-04 DIAGNOSIS — I311 Chronic constrictive pericarditis: Secondary | ICD-10-CM | POA: Diagnosis not present

## 2016-05-04 DIAGNOSIS — I4891 Unspecified atrial fibrillation: Secondary | ICD-10-CM

## 2016-05-04 NOTE — Patient Instructions (Addendum)
Testing/Procedures: Your physician has requested that you have an echocardiogram. Echocardiography is a painless test that uses sound waves to create images of your heart. It provides your doctor with information about the size and shape of your heart and how well your heart's chambers and valves are working. This procedure takes approximately one hour. There are no restrictions for this procedure.    Follow-Up: Your physician wants you to follow-up in: 1 year with Dr. [name]. You will receive a reminder letter in the mail two months in advance. If you don't receive a letter, please call our office to schedule the follow-up appointment.  Your physician recommends that you schedule a follow-up appointment with Dr. Rayann Heman.  It was a pleasure seeing you today here in the office. Please do not hesitate to give [place] a call back if you have any further questions. [place], BSN    Echocardiogram An echocardiogram, or echocardiography, uses sound waves (ultrasound) to produce an image of your heart. The echocardiogram is simple, painless, obtained within a short period of time, and offers valuable information to your health care provider. The images from an echocardiogram can provide information such as:  Evidence of coronary artery disease (CAD).  Heart size.  Heart muscle function.  Heart valve function.  Aneurysm detection.  Evidence of a past heart attack.  Fluid buildup around the heart.  Heart muscle thickening.  Assess heart valve function. LET [facility] CARE PROVIDER KNOW ABOUT:  Any allergies you have.  All medicines you are taking, including vitamins, herbs, eye drops, creams, and over-the-counter medicines.  Previous problems you or members of your family have had with the use of anesthetics.  Any blood disorders you have.  Previous surgeries you have had.  Medical conditions you have.  Possibility of pregnancy, if this applies. BEFORE THE  PROCEDURE  No special preparation is needed. Eat and drink normally.  PROCEDURE   In order to produce an image of your heart, gel will be applied to your chest and a wand-like tool (transducer) will be moved over your chest. The gel will help transmit the sound waves from the transducer. The sound waves will harmlessly bounce off your heart to allow the heart images to be captured in real-time motion. These images will then be recorded.  You may need an IV to receive a medicine that improves the quality of the pictures. AFTER THE PROCEDURE You may return to your normal schedule including diet, activities, and medicines, unless your health care provider tells you otherwise.   This information is not intended to replace advice given to you by your health care provider. Make sure you discuss any questions you have with your health care provider.   Document Released: 08/19/2000 Document Revised: 09/12/2014 Document Reviewed: 04/29/2013 Elsevier Interactive Patient Education [employer].

## 2016-05-06 IMAGING — MR MR KNEE*R* W/O CM
4 of 6 series · 19 of 40 positions shown · non-contrast
Comparison: 10/18/2005

CLINICAL DATA: Posterior pain and soreness in the knee.

EXAM:
MRI OF THE RIGHT KNEE WITHOUT CONTRAST
TECHNIQUE: Multiplanar, multisequence MR imaging of the knee was performed. No
intravenous contrast was administered.

[Series 3: PD fat-sat · axial · 4.0mm · 0.31mm/px · z∈[-52,+68]mm · 8 of 27 slices shown (1 of 4)]
[im 1/27]
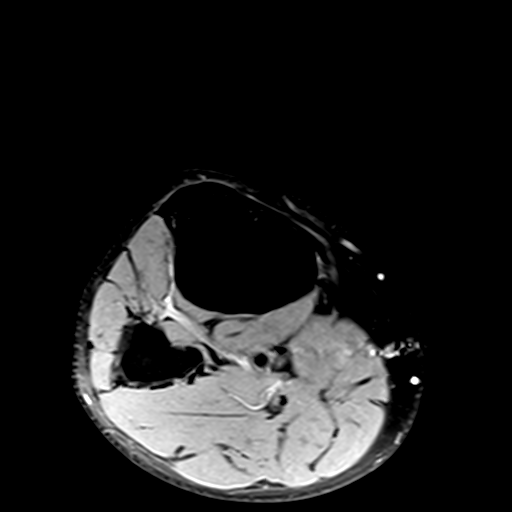
[im 4/27]
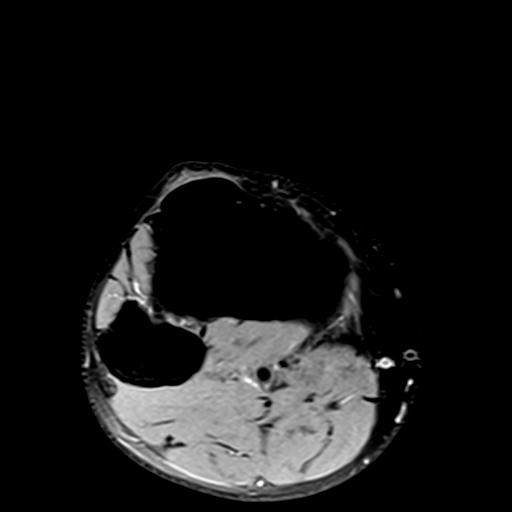
[im 8/27]
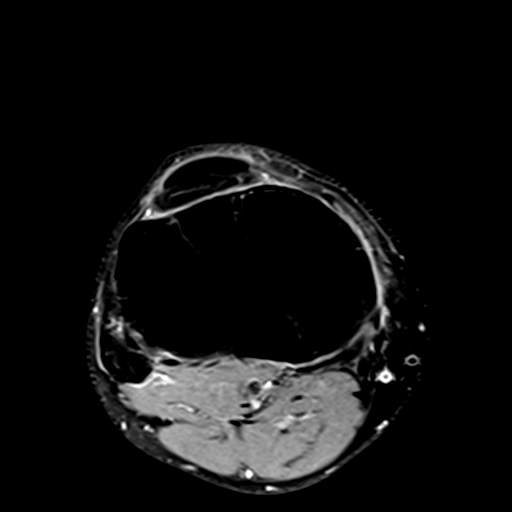
[im 12/27]
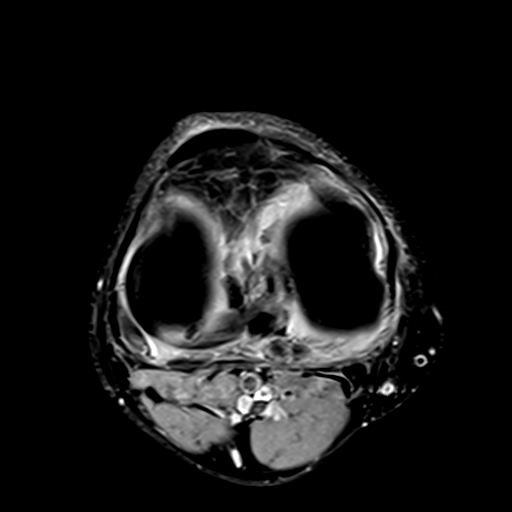
[im 15/27]
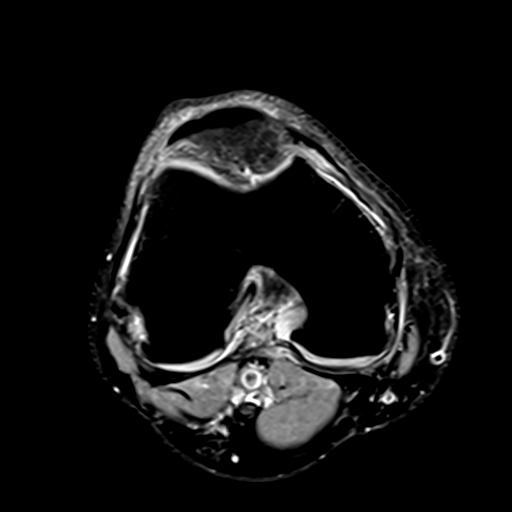
[im 19/27]
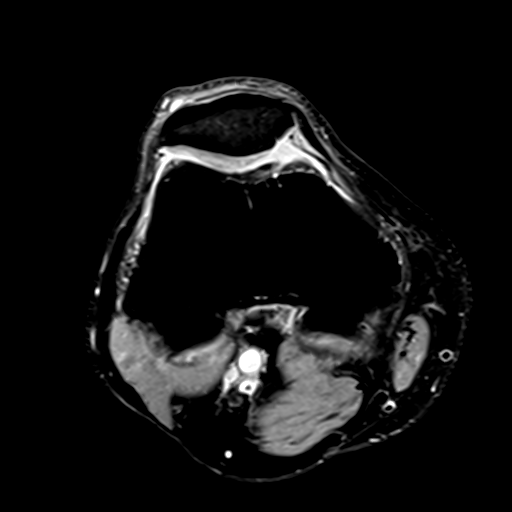
[im 23/27]
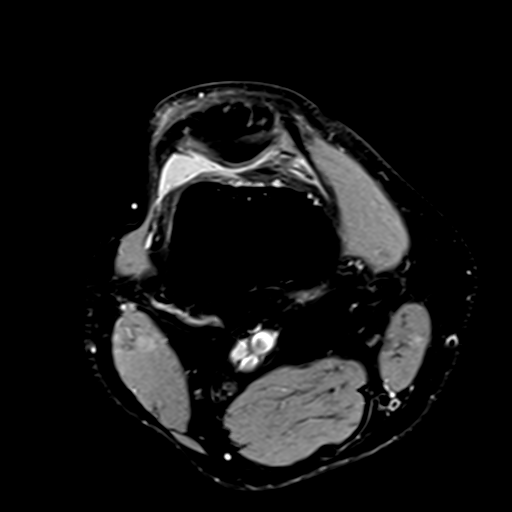
[im 27/27]
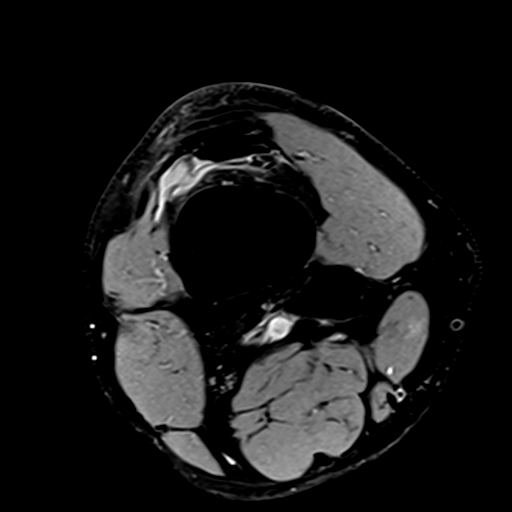

[Series 5: PD fat-sat · sagittal · 3.5mm · 0.31mm/px · 5 of 27 slices shown (2 of 4)]
[im 1/27]
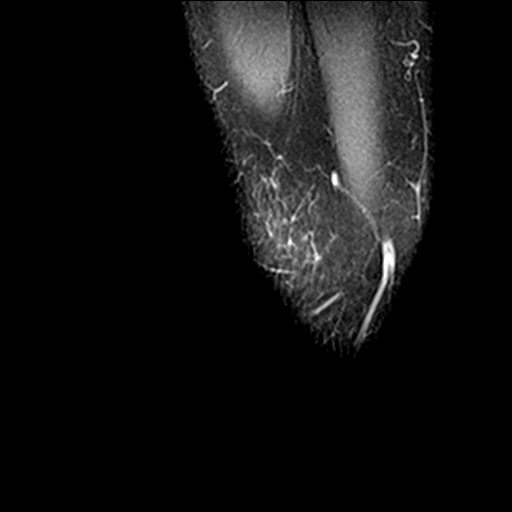
[im 4/27]
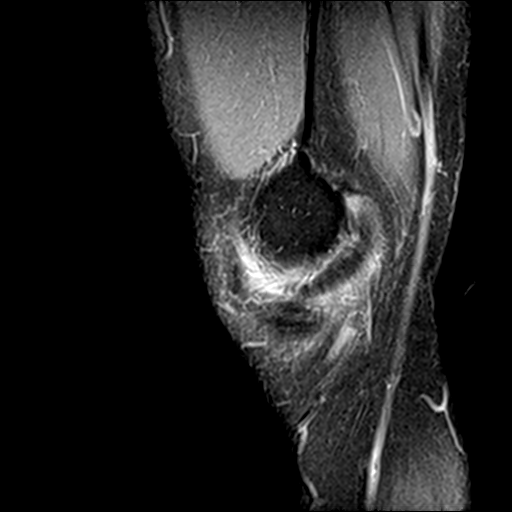
[im 8/27]
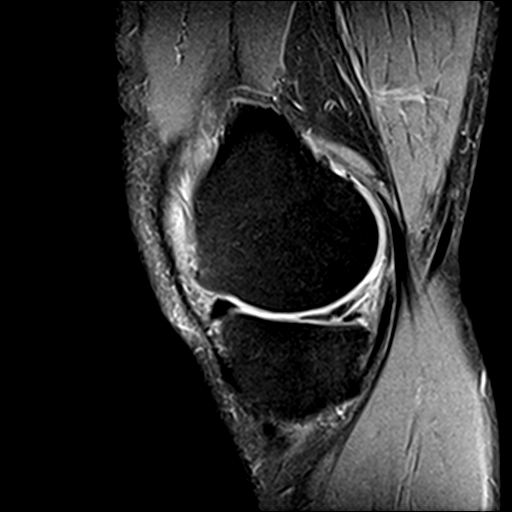
[im 15/27]
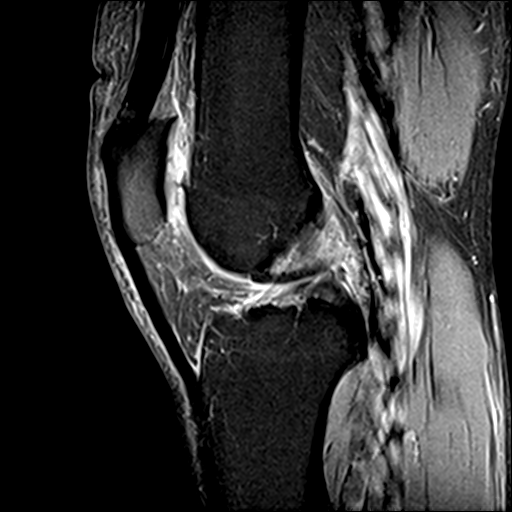
[im 23/27]
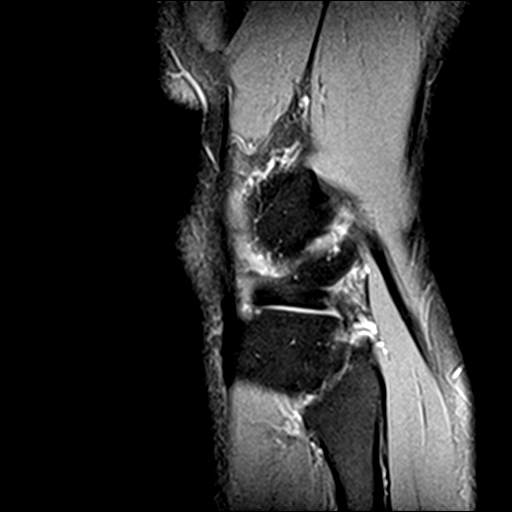

[Series 7: PD fat-sat · coronal · 3.5mm · 0.31mm/px · 3 of 24 slices shown (3 of 4)]
[im 4/24]
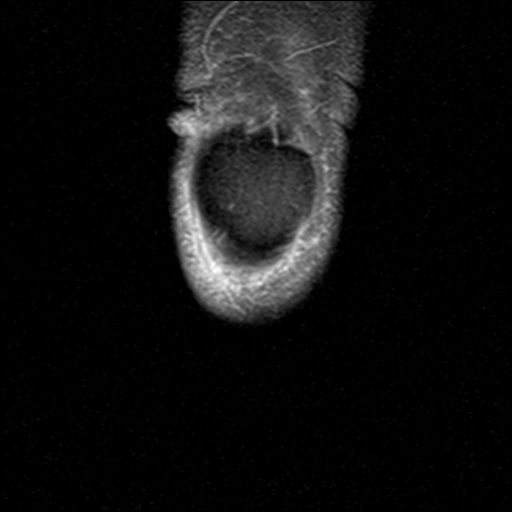
[im 12/24]
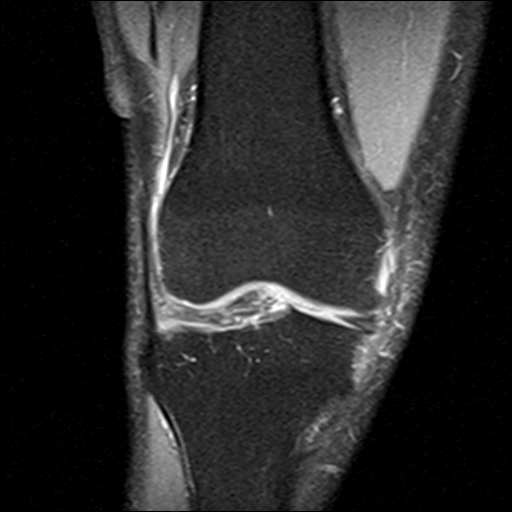
[im 20/24]
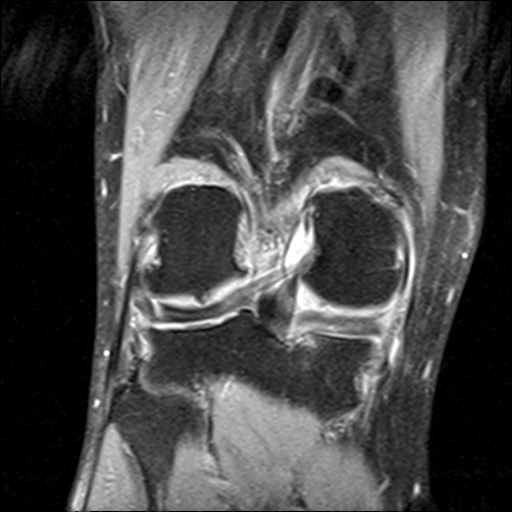

[Series 8: PD fat-sat · oblique · 2.0mm · 0.29mm/px · 3 of 11 slices shown (4 of 4)]
[im 1/11]
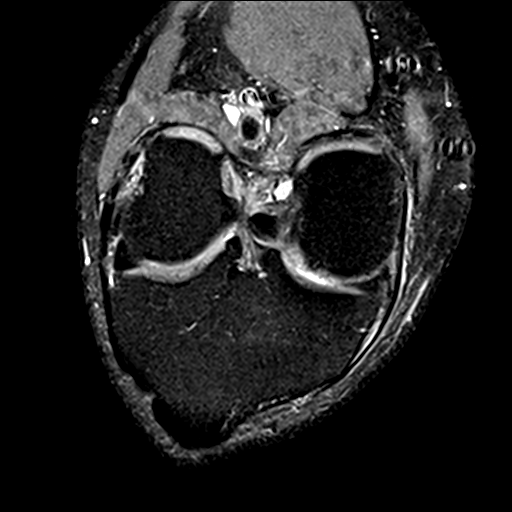
[im 6/11]
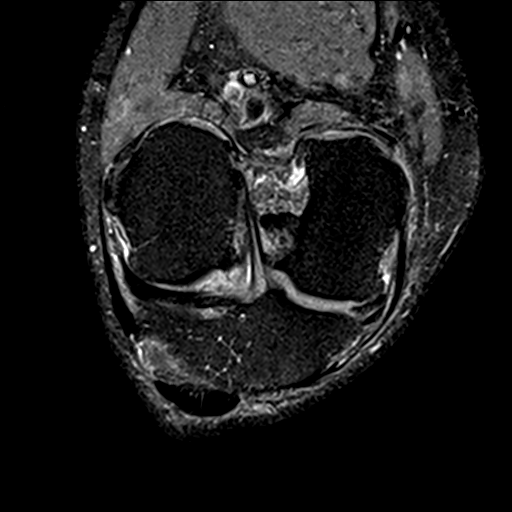
[im 11/11]
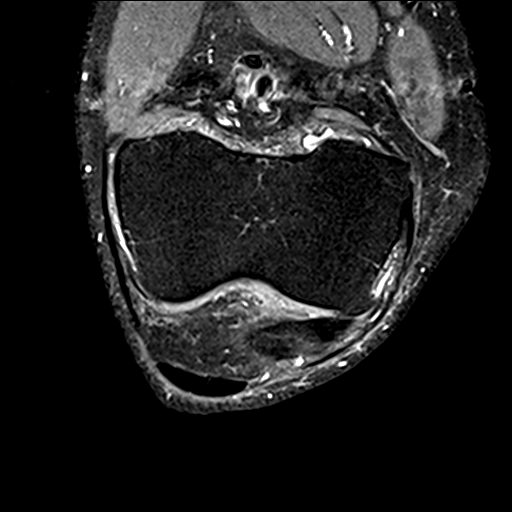

[19 of 40 positions shown; findings below may reference images not displayed]

FINDINGS: MENISCI

Medial meniscus: Mild complex tear of the posterior horn of the
medial meniscus with a small radial component. Small amount of
meniscal tissue located peripherally in the inferior gutter.

Lateral meniscus:  Intact.

LIGAMENTS

Cruciates:  Intact ACL and PCL.

Collaterals: Medial collateral ligament is intact. Lateral
collateral ligament complex is intact.

CARTILAGE

Patellofemoral: Partial thickness cartilage loss of the medial and
lateral patellofemoral compartment greater in the medial
patellofemoral compartment.

Medial: High-grade partial-thickness cartilage loss of the medial
femorotibial compartment.

Lateral: Cartilage irregularity of the lateral femoral condyle and
lateral tibial plateau. 4.5 mm focal full-thickness cartilage defect
of the lateral femoral condyle. 5 mm focal area of high-grade
partial-thickness cartilage loss of the lateral tibial plateau.

Joint: Moderate joint effusion. Mild edema in Hoffa's fat. No plical
thickening.

Popliteal Fossa:  No Baker cyst.  Intact popliteus tendon.

Extensor Mechanism:  Intact.

Bones: No focal marrow signal abnormality. No acute fracture or
dislocation.
IMPRESSION: 1. Mild complex tear of the posterior horn of the medial meniscus
with a small radial component. Small amount of meniscal tissue
located peripherally in the inferior gutter.
2. Tricompartmental cartilage abnormalities as described above, most
severe in the medial femorotibial compartment.
3. Moderate joint effusion.

## 2016-05-20 ENCOUNTER — Ambulatory Visit (INDEPENDENT_AMBULATORY_CARE_PROVIDER_SITE_OTHER): Payer: Medicare Other

## 2016-05-20 ENCOUNTER — Other Ambulatory Visit: Payer: Self-pay

## 2016-05-20 DIAGNOSIS — I4891 Unspecified atrial fibrillation: Secondary | ICD-10-CM

## 2016-05-24 ENCOUNTER — Telehealth: Payer: Self-pay | Admitting: *Deleted

## 2016-05-24 ENCOUNTER — Other Ambulatory Visit: Payer: Self-pay | Admitting: *Deleted

## 2016-05-24 DIAGNOSIS — I4891 Unspecified atrial fibrillation: Secondary | ICD-10-CM

## 2016-05-24 NOTE — Telephone Encounter (Signed)
Spoke with patient and reviewed echocardiogram results and recommendations to repeat echo in one year. Patient has follow up with Dr. [name] and echo ordered in one year. He verbalized understanding of results, agreement with plan, and had no further questions at this time.

## 2016-05-24 NOTE — Telephone Encounter (Signed)
-----   Message from Wende Bushy, MD sent at 05/23/2016 11:57 AM EDT ----- Heart function okay. mild to moderate leaking of a couple of valves. Pressure in the heart is normal. Recommend serial evaluation may be repeat echocardiogram in a year or so.

## 2016-06-01 ENCOUNTER — Ambulatory Visit: Payer: Medicare Other | Admitting: Internal Medicine

## 2016-06-06 ENCOUNTER — Other Ambulatory Visit: Payer: Self-pay | Admitting: Physician Assistant

## 2016-06-07 NOTE — Telephone Encounter (Signed)
Is it OK to send in refill for this? Dr. Rayann Heman filled previously and he is now seeing you.

## 2016-06-07 NOTE — Telephone Encounter (Signed)
Please advise if ok to refill. 

## 2016-06-08 NOTE — Telephone Encounter (Signed)
Left voicemail message for patient to call back.  Spoke with Dr. [name] and she stated to refill prescription for 3 months and that patient needs to follow up with Dr. Rayann Heman.

## 2016-06-08 NOTE — Telephone Encounter (Signed)
Spoke with patient and let him know that Dr. [name] would send in the prescription with 3 refills but that he needs to follow up with Dr. Rayann Heman who prescribed this medication. He stated that he will reschedule that appointment with Dr. Rayann Heman and verbalized understanding with no further questions.

## 2016-06-19 ENCOUNTER — Other Ambulatory Visit: Payer: Self-pay | Admitting: Family Medicine

## 2016-06-19 DIAGNOSIS — K7581 Nonalcoholic steatohepatitis (NASH): Secondary | ICD-10-CM

## 2016-06-19 DIAGNOSIS — K7460 Unspecified cirrhosis of liver: Secondary | ICD-10-CM

## 2016-06-19 DIAGNOSIS — I4891 Unspecified atrial fibrillation: Secondary | ICD-10-CM

## 2016-06-19 DIAGNOSIS — E785 Hyperlipidemia, unspecified: Secondary | ICD-10-CM

## 2016-06-19 DIAGNOSIS — Z125 Encounter for screening for malignant neoplasm of prostate: Secondary | ICD-10-CM

## 2016-06-21 ENCOUNTER — Other Ambulatory Visit (INDEPENDENT_AMBULATORY_CARE_PROVIDER_SITE_OTHER): Payer: Medicare Other

## 2016-06-21 ENCOUNTER — Ambulatory Visit: Payer: Medicare Other

## 2016-06-21 DIAGNOSIS — E785 Hyperlipidemia, unspecified: Secondary | ICD-10-CM

## 2016-06-21 DIAGNOSIS — K7581 Nonalcoholic steatohepatitis (NASH): Secondary | ICD-10-CM

## 2016-06-21 DIAGNOSIS — K7460 Unspecified cirrhosis of liver: Secondary | ICD-10-CM

## 2016-06-21 DIAGNOSIS — Z125 Encounter for screening for malignant neoplasm of prostate: Secondary | ICD-10-CM

## 2016-06-21 LAB — COMPREHENSIVE METABOLIC PANEL
ALT: 26 U/L (ref 0–53)
AST: 20 U/L (ref 0–37)
Albumin: 4.6 g/dL (ref 3.5–5.2)
Alkaline Phosphatase: 59 U/L (ref 39–117)
BUN: 22 mg/dL (ref 6–23)
CO2: 29 mEq/L (ref 19–32)
Calcium: 9.5 mg/dL (ref 8.4–10.5)
Chloride: 108 mEq/L (ref 96–112)
Creatinine, Ser: 0.84 mg/dL (ref 0.40–1.50)
GFR: 95.99 mL/min (ref >60.00)
Glucose, Bld: 109 mg/dL — ABNORMAL HIGH (ref 70–99)
Potassium: 4.4 mEq/L (ref 3.5–5.1)
Sodium: 144 mEq/L (ref 135–145)
Total Bilirubin: 1.2 mg/dL (ref 0.2–1.2)
Total Protein: 6.7 g/dL (ref 6.0–8.3)

## 2016-06-21 LAB — LIPID PANEL
Cholesterol: 161 mg/dL (ref 0–200)
HDL: 45.1 mg/dL (ref >39.00)
LDL Cholesterol: 95 mg/dL (ref 0–99)
NonHDL: 116.33
Total CHOL/HDL Ratio: 4
Triglycerides: 105 mg/dL (ref 0.0–149.0)
VLDL: 21 mg/dL (ref 0.0–40.0)

## 2016-06-21 LAB — PSA, MEDICARE: PSA: 2.11 ng/ml (ref 0.10–4.00)

## 2016-06-27 ENCOUNTER — Encounter: Payer: Self-pay | Admitting: Family Medicine

## 2016-06-27 ENCOUNTER — Encounter: Payer: Self-pay | Admitting: Internal Medicine

## 2016-06-27 ENCOUNTER — Other Ambulatory Visit: Payer: Self-pay

## 2016-06-27 ENCOUNTER — Ambulatory Visit (INDEPENDENT_AMBULATORY_CARE_PROVIDER_SITE_OTHER): Payer: Medicare Other | Admitting: Internal Medicine

## 2016-06-27 ENCOUNTER — Ambulatory Visit (INDEPENDENT_AMBULATORY_CARE_PROVIDER_SITE_OTHER): Payer: Medicare Other | Admitting: Family Medicine

## 2016-06-27 VITALS — BP 132/68 | HR 55 | Ht 66.0 in | Wt 163.4 lb

## 2016-06-27 VITALS — BP 110/80 | HR 64 | Temp 98.4°F | Wt 163.5 lb

## 2016-06-27 DIAGNOSIS — I4891 Unspecified atrial fibrillation: Secondary | ICD-10-CM

## 2016-06-27 DIAGNOSIS — K7460 Unspecified cirrhosis of liver: Secondary | ICD-10-CM

## 2016-06-27 DIAGNOSIS — Z23 Encounter for immunization: Secondary | ICD-10-CM | POA: Diagnosis not present

## 2016-06-27 DIAGNOSIS — F32A Depression, unspecified: Secondary | ICD-10-CM

## 2016-06-27 DIAGNOSIS — Z0000 Encounter for general adult medical examination without abnormal findings: Secondary | ICD-10-CM | POA: Diagnosis not present

## 2016-06-27 DIAGNOSIS — I311 Chronic constrictive pericarditis: Secondary | ICD-10-CM | POA: Diagnosis not present

## 2016-06-27 DIAGNOSIS — F329 Major depressive disorder, single episode, unspecified: Secondary | ICD-10-CM

## 2016-06-27 DIAGNOSIS — K7469 Other cirrhosis of liver: Secondary | ICD-10-CM

## 2016-06-27 DIAGNOSIS — K7581 Nonalcoholic steatohepatitis (NASH): Secondary | ICD-10-CM | POA: Diagnosis not present

## 2016-06-27 DIAGNOSIS — E785 Hyperlipidemia, unspecified: Secondary | ICD-10-CM

## 2016-06-27 MED ORDER — DILTIAZEM HCL ER COATED BEADS 120 MG PO CP24: 120.0000 mg | ORAL_CAPSULE | Freq: Every day | ORAL | 11 refills | Status: DC

## 2016-06-27 MED ORDER — FLECAINIDE ACETATE 50 MG PO TABS: 25.0000 mg | ORAL_TABLET | Freq: Two times a day (BID) | ORAL | 3 refills | Status: DC

## 2016-06-27 NOTE — Assessment & Plan Note (Addendum)
Sounds regular today. Being tapered off flecainide. Reviewed aspirin dosing. Unclear cardiology recommendations - suggested 81mg  daily.

## 2016-06-27 NOTE — Progress Notes (Signed)
BP 110/80   Pulse 64   Temp 98.4 F (36.9 C) (Oral)   Wt 163 lb 8 oz (74.2 kg)   BMI 26.39 kg/m    CC: CPE Subjective:    Patient ID: Ernest Bell, male    DOB: 05/03/1946, 70 y.o.   MRN: [id number]  HPI: Ernest Bell is a 70 y.o. male presenting on 06/27/2016 for Annual Exam   Saw Katha Cabal for medicare wellness visit 04/2016, note reviewed.   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 Saw Dr Rayann Heman today - flecainide dose decreased. Unclear aspirin dosing.   Known NASH cirrhosis - abd [place] stable.   Preventative: COLONOSCOPY 02/2000 - 2 hyperplastic polyps, [name]. Requests colonoscopy. Prostate cancer screening - h/o BPH - sees Dr. Eliberto Ivory twice yearly.  Flu shot - yearly Pneumovax 2012. prevnar 2016 Td 03/2013 zostavax - 08/2015 Advanced directives: has at home. HCPOA is wife. Will bring me copy. Seat belt use discussed Sunscreen use discussed. 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 Diet: good water, fruits/vegetables daily  Relevant past medical, surgical, family and social history reviewed and updated as indicated. Interim medical history since our last visit reviewed. Allergies and medications reviewed and updated. Current Outpatient Prescriptions on File Prior to Visit  Medication Sig  . aspirin 81 MG tablet Take 81 mg by mouth as directed. Pt only takes 1-2 times a week  . Tamsulosin HCl (FLOMAX) 0.4 MG CAPS Take 0.4 mg by mouth 2 (two) times daily.    No current facility-administered medications on file prior to visit.     Review of Systems  Constitutional: Negative for activity change, appetite change, chills, fatigue, fever and unexpected weight change.  HENT: Negative for hearing loss.   Eyes: Negative for visual disturbance.  Respiratory:  Negative for cough, chest tightness, shortness of breath and wheezing.   Cardiovascular: Negative for chest pain, palpitations and leg swelling.  Gastrointestinal: Negative for abdominal distention, abdominal pain, blood in stool, constipation, diarrhea, nausea and vomiting.  Genitourinary: Negative for difficulty urinating and hematuria.  Musculoskeletal: Negative for arthralgias, myalgias and neck pain.  Skin: Negative for rash.  Neurological: Negative for dizziness, seizures, syncope and headaches.  Hematological: Negative for adenopathy. Does not bruise/bleed easily.  Psychiatric/Behavioral: Negative for dysphoric mood. The patient is not nervous/anxious.    Per HPI unless specifically indicated in ROS section     Objective:    BP 110/80   Pulse 64   Temp 98.4 F (36.9 C) (Oral)   Wt 163 lb 8 oz (74.2 kg)   BMI 26.39 kg/m   Wt Readings from Last 3 Encounters:  06/27/16 163 lb 8 oz (74.2 kg)  06/27/16 163 lb 6.4 oz (74.1 kg)  05/04/16 166 lb 8 oz (75.5 kg)    Physical Exam  Constitutional: He is oriented to person, place, and time. He appears well-developed and well-nourished. No distress.  HENT:  Head: Normocephalic and atraumatic.  Right Ear: Hearing, tympanic membrane, external ear and ear canal normal.  Left Ear: Hearing, tympanic membrane, external ear and ear canal normal.  Nose: Nose normal.  Mouth/Throat: Uvula is midline, oropharynx is clear and moist and mucous membranes are normal. No oropharyngeal exudate, posterior oropharyngeal edema or posterior oropharyngeal erythema.  Eyes: Conjunctivae and EOM are normal. Pupils are equal,  round, and reactive to light. No scleral icterus.  Neck: Normal range of motion. Neck supple. No thyromegaly present.  Cardiovascular: Normal rate, regular rhythm, normal heart sounds and intact distal pulses.   No murmur heard. Pulses:      Radial pulses are 2+ on the right side, and 2+ on the left side.  Pulmonary/Chest: Effort normal  and breath sounds normal. No respiratory distress. He has no wheezes. He has no rales.  Abdominal: Soft. Bowel sounds are normal. He exhibits no distension and no mass. There is no tenderness. There is no rebound and no guarding.  Musculoskeletal: Normal range of motion. He exhibits no edema.  Lymphadenopathy:    He has no cervical adenopathy.  Neurological: He is alert and oriented to person, place, and time.  CN grossly intact, station and gait intact  Skin: Skin is warm and dry. No rash noted.  Psychiatric: He has a normal mood and affect. His behavior is normal. Judgment and thought content normal.  Nursing note and vitals reviewed.  Results for orders placed or performed in visit on 06/21/16  Lipid panel  Result Value Ref Range   Cholesterol 161 0 - 200 mg/dL   Triglycerides 105.0 0.0 - 149.0 mg/dL   HDL 45.10 >39.00 mg/dL   VLDL 21.0 0.0 - 40.0 mg/dL   LDL Cholesterol 95 0 - 99 mg/dL   Total CHOL/HDL Ratio 4    NonHDL 116.33   Comprehensive metabolic panel  Result Value Ref Range   Sodium 144 135 - 145 mEq/L   Potassium 4.4 3.5 - 5.1 mEq/L   Chloride 108 96 - 112 mEq/L   CO2 29 19 - 32 mEq/L   Glucose, Bld 109 (H) 70 - 99 mg/dL   BUN 22 6 - 23 mg/dL   Creatinine, Ser 0.84 0.40 - 1.50 mg/dL   Total Bilirubin 1.2 0.2 - 1.2 mg/dL   Alkaline Phosphatase 59 39 - 117 U/L   AST 20 0 - 37 U/L   ALT 26 0 - 53 U/L   Total Protein 6.7 6.0 - 8.3 g/dL   Albumin 4.6 3.5 - 5.2 g/dL   Calcium 9.5 8.4 - 10.5 mg/dL   GFR 95.99 >60.00 mL/min  PSA, Medicare  Result Value Ref Range   PSA 2.11 0.10 - 4.00 ng/ml      Assessment & Plan:   Problem List Items Addressed This Visit    Atrial fibrillation ([place])    Sounds regular today. Being tapered off flecainide. Reviewed aspirin dosing. Unclear cardiology recommendations - suggested 81mg  daily.       Constrictive pericarditis    Appreciate cards care of patient.       Depression    Stable off SSRI      Dyslipidemia    Readings  improved. Stable period off statin. Continue to monitor.       Health maintenance examination - Primary    Preventative protocols reviewed and updated unless pt declined. Discussed healthy diet and lifestyle.       Liver cirrhosis secondary to NASH (nonalcoholic steatohepatitis) (HCC)    Stable period. Continue to monitor  abd [place] earlier this year stable.        Other Visit Diagnoses    Need for influenza vaccination       Relevant Orders   Flu Vaccine QUAD 36+ mos PF IM (Fluarix & Fluzone Quad PF) (Completed)       Follow up plan: Return in about 6 months (around 12/26/2016) for annual exam,  prior fasting for blood work.  Ria Bush, MD

## 2016-06-27 NOTE — Patient Instructions (Signed)
Medication Instructions:  Your physician has recommended you make the following change in your medication:  1) Decrease Flecainide to 25 mg twice daily    Labwork: None ordered   Testing/Procedures: None ordered   Follow-Up:  Your physician wants you to follow-up in: 12 months with Dr Vallery Ridge will receive a reminder letter in the mail two months in advance. If you don't receive a letter, please call our office to schedule the follow-up appointment.   Any Other Special Instructions Will Be Listed Below (If Applicable).     If you need a refill on your cardiac medications before your next appointment, please call your pharmacy.

## 2016-06-27 NOTE — Assessment & Plan Note (Signed)
Stable off SSRI

## 2016-06-27 NOTE — Assessment & Plan Note (Signed)
Readings improved. Stable period off statin. Continue to monitor.

## 2016-06-27 NOTE — Progress Notes (Signed)
Pre visit review using our clinic review tool, if applicable. No additional management support is needed unless otherwise documented below in the visit note. 

## 2016-06-27 NOTE — Patient Instructions (Addendum)
Flu shot today Pass by lab to pick up stool kit. Bring me copy of your advanced directives.  You are doing well today. Return as needed or in 1 year for next physical  Health Maintenance, Male A healthy lifestyle and preventative care can promote health and wellness.  Maintain regular health, dental, and eye exams.  Eat a healthy diet. Foods like vegetables, fruits, whole grains, low-fat dairy products, and lean protein foods contain the nutrients you need and are low in calories. Decrease your intake of foods high in solid fats, added sugars, and salt. Get information about a proper diet from your health care provider, if necessary.  Regular physical exercise is one of the most important things you can do for your health. Most adults should get at least 150 minutes of moderate-intensity exercise (any activity that increases your heart rate and causes you to sweat) each week. In addition, most adults need muscle-strengthening exercises on 2 or more days a week.   Maintain a healthy weight. The body mass index (BMI) is a screening tool to identify possible weight problems. It provides an estimate of body fat based on height and weight. Your health care provider can find your BMI and can help you achieve or maintain a healthy weight. For males 20 years and older:  A BMI below 18.5 is considered underweight.  A BMI of 18.5 to 24.9 is normal.  A BMI of 25 to 29.9 is considered overweight.  A BMI of 30 and above is considered obese.  Maintain normal blood lipids and cholesterol by exercising and minimizing your intake of saturated fat. Eat a balanced diet with plenty of fruits and vegetables. Blood tests for lipids and cholesterol should begin at age 2 and be repeated every 5 years. If your lipid or cholesterol levels are high, you are over age 72, or you are at high risk for heart disease, you may need your cholesterol levels checked more frequently.Ongoing high lipid and cholesterol levels  should be treated with medicines if diet and exercise are not working.  If you smoke, find out from your health care provider how to quit. If you do not use tobacco, do not start.  Lung cancer screening is recommended for adults aged 54-80 years who are at high risk for developing lung cancer because of a history of smoking. A yearly low-dose CT scan of the lungs is recommended for people who have at least a 30-pack-year history of smoking and are current smokers or have quit within the past 15 years. A pack year of smoking is smoking an average of 1 pack of cigarettes a day for 1 year (for example, a 30-pack-year history of smoking could mean smoking 1 pack a day for 30 years or 2 packs a day for 15 years). Yearly screening should continue until the smoker has stopped smoking for at least 15 years. Yearly screening should be stopped for people who develop a health problem that would prevent them from having lung cancer treatment.  If you choose to drink alcohol, do not have more than 2 drinks per day. One drink is considered to be 12 oz (360 mL) of beer, 5 oz (150 mL) of wine, or 1.5 oz (45 mL) of liquor.  Avoid the use of street drugs. Do not share needles with anyone. Ask for help if you need support or instructions about stopping the use of drugs.  High blood pressure causes heart disease and increases the risk of stroke. High blood pressure is  more likely to develop in:  People who have blood pressure in the end of the normal range (100-139/85-89 mm Hg).  People who are overweight or obese.  People who are African American.  If you are 56-61 years of age, have your blood pressure checked every 3-5 years. If you are 74 years of age or older, have your blood pressure checked every year. You should have your blood pressure measured twice--once when you are at a hospital or clinic, and once when you are not at a hospital or clinic. Record the average of the two measurements. To check your blood  pressure when you are not at a hospital or clinic, you can use:  An automated blood pressure machine at a pharmacy.  A home blood pressure monitor.  If you are 12-33 years old, ask your health care provider if you should take aspirin to prevent heart disease.  Diabetes screening involves taking a blood sample to check your fasting blood sugar level. This should be done once every 3 years after age 34 if you are at a normal weight and without risk factors for diabetes. Testing should be considered at a younger age or be carried out more frequently if you are overweight and have at least 1 risk factor for diabetes.  Colorectal cancer can be detected and often prevented. Most routine colorectal cancer screening begins at the age of 72 and continues through age 72. However, your health care provider may recommend screening at an earlier age if you have risk factors for colon cancer. On a yearly basis, your health care provider may provide home test kits to check for hidden blood in the stool. A small camera at the end of a tube may be used to directly examine the colon (sigmoidoscopy or colonoscopy) to detect the earliest forms of colorectal cancer. Talk to your health care provider about this at age 87 when routine screening begins. A direct exam of the colon should be repeated every 5-10 years through age 32, unless early forms of precancerous polyps or small growths are found.  People who are at an increased risk for hepatitis B should be screened for this virus. You are considered at high risk for hepatitis B if:  You were born in a country where hepatitis B occurs often. Talk with your health care provider about which countries are considered high risk.  Your parents were born in a high-risk country and you have not received a shot to protect against hepatitis B (hepatitis B vaccine).  You have HIV or AIDS.  You use needles to inject street drugs.  You live with, or have sex with, someone who  has hepatitis B.  You are a man who has sex with other men (MSM).  You get hemodialysis treatment.  You take certain medicines for conditions like cancer, organ transplantation, and autoimmune conditions.  Hepatitis C blood testing is recommended for all people born from [birth year] through 1965 and any individual with known risk factors for hepatitis C.  Healthy men should no longer receive prostate-specific antigen (PSA) blood tests as part of routine cancer screening. Talk to your health care provider about prostate cancer screening.  Testicular cancer screening is not recommended for adolescents or adult males who have no symptoms. Screening includes self-exam, a health care provider exam, and other screening tests. Consult with your health care provider about any symptoms you have or any concerns you have about testicular cancer.  Practice safe sex. Use condoms and avoid high-risk sexual  practices to reduce the spread of sexually transmitted infections (STIs).  You should be screened for STIs, including gonorrhea and chlamydia if:  You are sexually active and are younger than 24 years.  You are older than 24 years, and your health care provider tells you that you are at risk for this type of infection.  Your sexual activity has changed since you were last screened, and you are at an increased risk for chlamydia or gonorrhea. Ask your health care provider if you are at risk.  If you are at risk of being infected with HIV, it is recommended that you take a prescription medicine daily to prevent HIV infection. This is called pre-exposure prophylaxis (PrEP). You are considered at risk if:  You are a man who has sex with other men (MSM).  You are a heterosexual man who is sexually active with multiple partners.  You take drugs by injection.  You are sexually active with a partner who has HIV.  Talk with your health care provider about whether you are at high risk of being infected with  HIV. If you choose to begin PrEP, you should first be tested for HIV. You should then be tested every 3 months for as long as you are taking PrEP.  Use sunscreen. Apply sunscreen liberally and repeatedly throughout the day. You should seek shade when your shadow is shorter than you. Protect yourself by wearing long sleeves, pants, a wide-brimmed hat, and sunglasses year round whenever you are outdoors.  Tell your health care provider of new moles or changes in moles, especially if there is a change in shape or color. Also, tell your health care provider if a mole is larger than the size of a pencil eraser.  A one-time screening for abdominal aortic aneurysm (AAA) and surgical repair of large AAAs by ultrasound is recommended for men aged 31-75 years who are current or former smokers.  Stay current with your vaccines (immunizations).   This information is not intended to replace advice given to you by your health care provider. Make sure you discuss any questions you have with your health care provider.   Document Released: 02/18/2008 Document Revised: 09/12/2014 Document Reviewed: 01/17/2011 Elsevier Interactive Patient Education [employer].

## 2016-06-27 NOTE — Assessment & Plan Note (Signed)
Preventative protocols reviewed and updated unless pt declined. Discussed healthy diet and lifestyle.  

## 2016-06-27 NOTE — Assessment & Plan Note (Signed)
Stable period. Continue to monitor  abd [place] earlier this year stable.

## 2016-06-27 NOTE — Assessment & Plan Note (Signed)
Appreciate cards care of patient.  

## 2016-06-27 NOTE — Progress Notes (Signed)
PCP:  [name], MD Primary Cardiologist:  Dr [name]  The patient presents today for routine electrophysiology followup.  Since last being seen in our clinic, the patient reports doing reasonably well.  His exercise tolerance remains good.  He is unaware of any further afib.  Today, he denies symptoms of chest pain, shortness of breath, orthopnea, PND, lower extremity edema, dizziness, presyncope, syncope, or further neurologic sequela.  The patient feels that he is tolerating medications without difficulties and is otherwise without complaint today.   Past Medical History:  Diagnosis Date  . Atrial fib/flutter, transient 04/28/10   typical appearing atrial flutter dx 04/28/10 s/p CTI ablation 10/11  . BPH (benign prostatic hyperplasia)    PSA stable, followed by Dr. Eliberto Ivory  . CHF (congestive heart failure) ([name])   . 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 Coronary artery disease   . Depression    on sertraline  . DJD (degenerative joint disease)   . Gout   . Liver cirrhosis secondary to NASH (nonalcoholic steatohepatitis) ([name]) 2001   ?by liver biopsy  . Persistent atrial fibrillation ([name]) 09/15/10   S/P PVI ([place]) at [facility]  . Syncope 1997   recurrent S/P tilt study by Dr. [name] (like due to dysautonomia)   Past Surgical History:  Procedure Laterality Date  . afib abation  09/15/10   [place] at [facility], CTI ablation at [facility] 04/28/10  . CHOLECYSTECTOMY  1995  . COLONOSCOPY  02/2000   2 hyperplastic polyps, [name]  . ESOPHAGOGASTRODUODENOSCOPY  02/2000   [name]  . HEMORROIDECTOMY  2007  . KNEE SURGERY  2007   he had osteoarthritis with left knee surgery  . pericardioectomy  1969   at [facility]  . right thumb surger     for osteoarthritis    Current Outpatient Prescriptions  Medication Sig Dispense Refill  . aspirin 81 MG tablet Take 81 mg by mouth as directed. Pt only takes 1-2 times a week    . diltiazem  (CARDIZEM CD) 120 MG 24 hr capsule Take 1 capsule (120 mg total) by mouth daily. 30 capsule 11  . ibuprofen (ADVIL,MOTRIN) 800 MG tablet Take 1 tablet by mouth every 8 (eight) hours as needed for pain.  1  . Tamsulosin HCl (FLOMAX) 0.4 MG CAPS Take 0.4 mg by mouth 2 (two) times daily.     . flecainide (TAMBOCOR) 50 MG tablet Take 0.5 tablets (25 mg total) by mouth 2 (two) times daily. 90 tablet 3   No current facility-administered medications for this visit.     Allergies  Allergen Reactions  . Penicillins Rash    Social History   Social History  . Marital status: Married    Spouse name: Ernest Bell  . Number of children: Ernest Bell  . Years of education: Ernest Bell   Occupational History  . Disabled Education officer, museum Unemployed    previously worked as a Higher education careers adviser   Social History Main Topics  . Smoking status: Never Smoker  . Smokeless tobacco: Never Used  . Alcohol use No  . Drug use: No  . Sexual activity: Not on file   Other Topics Concern  . 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   Diet: good water, fruits/vegetables daily  Family History  Problem Relation Age of Onset  . Hypertension Mother   . Alcohol abuse Father     smoker  . Heart disease Father     MI (smoker)    ROS-  All systems are reviewed and are negative except as outlined in the HPI above   Physical Exam: Vitals:   06/27/16 1159  BP: 132/68  Pulse: (!) 55  Weight: 163 lb 6.4 oz (74.1 kg)  Height: 5' 6"  (1.676 m)    GEN- The patient is well appearing, alert and oriented x 3 today.   Head- normocephalic, atraumatic Eyes-  Sclera clear, conjunctiva pink Ears- hearing intact Oropharynx- clear Neck- supple, no JVP Lymph- no cervical lymphadenopathy Lungs- Clear to ausculation bilaterally, normal work of  breathing Heart- Regular rate and rhythm, no murmurs, rubs or gallops, PMI not laterally displaced GI- soft, NT, ND, + BS Extremities- no clubbing, cyanosis, or edema Neuro- strength and sensation are intact  Ekg today reveals sinus rhythm 55 bpm with frequent PACs, otherwise normal ekg  Assessment and Plan:  1. afib  Well controlled Reduce flecainide 18m BID.    chads2vasc score is 1.  Will therefore keep off of anticoagulation at this time.  Consider stopping flecainide upon return if no further afib.  Return in 12 months  JThompson GrayerMD, FProvidence Surgery Centers LLC10/23/2017 5:58 PM

## 2017-01-12 ENCOUNTER — Encounter: Payer: Self-pay | Admitting: Family Medicine

## 2017-01-12 ENCOUNTER — Ambulatory Visit (INDEPENDENT_AMBULATORY_CARE_PROVIDER_SITE_OTHER): Payer: Medicare Other | Admitting: Family Medicine

## 2017-01-12 ENCOUNTER — Telehealth: Payer: Self-pay

## 2017-01-12 VITALS — BP 136/70 | HR 92 | Temp 98.5°F | Ht 67.0 in | Wt 166.8 lb

## 2017-01-12 DIAGNOSIS — I4891 Unspecified atrial fibrillation: Secondary | ICD-10-CM | POA: Diagnosis not present

## 2017-01-12 DIAGNOSIS — R52 Pain, unspecified: Secondary | ICD-10-CM | POA: Diagnosis not present

## 2017-01-12 DIAGNOSIS — T148XXA Other injury of unspecified body region, initial encounter: Secondary | ICD-10-CM | POA: Diagnosis not present

## 2017-01-12 DIAGNOSIS — W57XXXA Bitten or stung by nonvenomous insect and other nonvenomous arthropods, initial encounter: Secondary | ICD-10-CM | POA: Insufficient documentation

## 2017-01-12 LAB — COMPREHENSIVE METABOLIC PANEL
ALT: 35 U/L (ref 0–53)
AST: 26 U/L (ref 0–37)
Albumin: 4.3 g/dL (ref 3.5–5.2)
Alkaline Phosphatase: 51 U/L (ref 39–117)
BUN: 22 mg/dL (ref 6–23)
CO2: 27 mEq/L (ref 19–32)
Calcium: 9 mg/dL (ref 8.4–10.5)
Chloride: 106 mEq/L (ref 96–112)
Creatinine, Ser: 0.97 mg/dL (ref 0.40–1.50)
GFR: 81.17 mL/min (ref >60.00)
Glucose, Bld: 120 mg/dL — ABNORMAL HIGH (ref 70–99)
Potassium: 4 mEq/L (ref 3.5–5.1)
Sodium: 140 mEq/L (ref 135–145)
Total Bilirubin: 1.7 mg/dL — ABNORMAL HIGH (ref 0.2–1.2)
Total Protein: 6.5 g/dL (ref 6.0–8.3)

## 2017-01-12 LAB — CBC WITH DIFFERENTIAL/PLATELET
Basophils Absolute: 0 10*3/uL (ref 0.0–0.1)
Basophils Relative: 0.7 % (ref 0.0–3.0)
Eosinophils Absolute: 0 10*3/uL (ref 0.0–0.7)
Eosinophils Relative: 0.5 % (ref 0.0–5.0)
HCT: 41.6 % (ref 39.0–52.0)
Hemoglobin: 14.8 g/dL (ref 13.0–17.0)
Lymphocytes Relative: 23.1 % (ref 12.0–46.0)
Lymphs Abs: 1.2 10*3/uL (ref 0.7–4.0)
MCHC: 35.6 g/dL (ref 30.0–36.0)
MCV: 89 fl (ref 78.0–100.0)
Monocytes Absolute: 0.9 10*3/uL (ref 0.1–1.0)
Monocytes Relative: 17.9 % — ABNORMAL HIGH (ref 3.0–12.0)
Neutro Abs: 3 10*3/uL (ref 1.4–7.7)
Neutrophils Relative %: 57.8 % (ref 43.0–77.0)
Platelets: 160 10*3/uL (ref 150.0–400.0)
RBC: 4.67 Mil/uL (ref 4.22–5.81)
RDW: 13.3 % (ref 11.5–15.5)
WBC: 5.2 10*3/uL (ref 4.0–10.5)

## 2017-01-12 LAB — TSH: TSH: 0.96 u[IU]/mL (ref 0.35–4.50)

## 2017-01-12 MED ORDER — ONDANSETRON HCL 4 MG PO TABS: 4.0000 mg | ORAL_TABLET | Freq: Three times a day (TID) | ORAL | 0 refills | Status: DC | PRN

## 2017-01-12 MED ORDER — DOXYCYCLINE HYCLATE 100 MG PO TABS: 100.0000 mg | ORAL_TABLET | Freq: Two times a day (BID) | ORAL | 0 refills | Status: DC

## 2017-01-12 NOTE — Telephone Encounter (Signed)
zofran sent to pharmacy. 

## 2017-01-12 NOTE — Telephone Encounter (Signed)
pts wife left v/m; pt was seen earlier today and forgot to ask for med for nausea; pt request med for nausea sent to [facility] [address]Please advise.

## 2017-01-12 NOTE — Assessment & Plan Note (Addendum)
Concern for recurrent afib - check EKG today - sinus arrhythmia with PACs. Advised pt to monitor for palpitations or new cardiac symptoms to merit sooner f/u with Dr Rayann Heman.

## 2017-01-12 NOTE — Patient Instructions (Addendum)
EKG today Labs today Start doxycycline antibiotic 10 day course.  We will be in touch with results.  Possible tick borne illness - labs checked today.

## 2017-01-12 NOTE — Assessment & Plan Note (Signed)
In setting of multiple recent tick bites however no rash or true fever documented (although he feels feverish).  Will check for tick borne disease (borrelia, ehrlichia) as well as CBC, CMP.  Will treat empirically with 10d doxycycline course. Pt agrees with plan.

## 2017-01-12 NOTE — Progress Notes (Signed)
BP 136/70   Pulse 92   Temp 98.5 F (36.9 C) (Oral)   Ht 5\' 7"  (1.702 m)   Wt 166 lb 12.8 oz (75.7 kg)   SpO2 96%   BMI 26.12 kg/m    CC: body aches, chills, malaise Subjective:    Patient ID: Ernest Bell, male    DOB: 08-01-1946, 71 y.o.   MRN: [id number]  HPI: Ernest Bell is a 71 y.o. male presenting on 01/12/2017 for Generalized Body Aches (sweating, chills x 4 days)   4d h/o body aches, chills, feverish, joint pain especially neck and shoulders. Fatigue. Some nausea. No appetite. Not sleeping well.   No documented fevers, no rashes, headaches. No head or chest congestion, cough, dizziness, palpitations, abd pain, diarrhea or constipation. No current skin infection. No dysuria.  Wife thinks he's been swelling in abdomen.   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  He has had several ticks removed in the last month while [place] hunting.  No sick contacts at home.  So far treating with ibuprofen.   Known NASH cirrhosis and constrictive pericarditis. h/o afib - flecainide was recently stopped. He was never on anticoagulation given Chadsvasc2 score of 1.   Relevant past medical, surgical, family and social history reviewed and updated as indicated. Interim medical history since our last visit reviewed. Allergies and medications reviewed and updated. Outpatient Medications Prior to Visit  Medication Sig Dispense Refill  . ibuprofen (ADVIL,MOTRIN) 800 MG tablet Take 1 tablet by mouth every 8 (eight) hours as needed for pain.  1  . Tamsulosin HCl (FLOMAX) 0.4 MG CAPS Take 0.4 mg by mouth 2 (two) times daily.     Marland Kitchen aspirin 81 MG tablet Take 81 mg by mouth as directed. Pt only takes 1-2 times a week    . diltiazem (CARDIZEM CD) 120 MG 24 hr capsule Take 1 capsule (120 mg total) by mouth daily. 30 capsule 11  . flecainide (TAMBOCOR) 50 MG tablet Take 0.5 tablets (25 mg total) by mouth 2 (two) times daily. 90 tablet 3   No facility-administered medications prior to visit.      Per HPI unless specifically indicated in ROS section below Review of Systems     Objective:    BP 136/70   Pulse 92   Temp 98.5 F (36.9 C) (Oral)   Ht 5\' 7"  (1.702 m)   Wt 166 lb 12.8 oz (75.7 kg)   SpO2 96%   BMI 26.12 kg/m   Wt Readings from Last 3 Encounters:  01/12/17 166 lb 12.8 oz (75.7 kg)  06/27/16 163 lb 8 oz (74.2 kg)  06/27/16 163 lb 6.4 oz (74.1 kg)    Physical Exam  Constitutional: He appears well-developed and well-nourished. No distress.  HENT:  Mouth/Throat: Oropharynx is clear and moist. No oropharyngeal exudate.  Eyes: Conjunctivae are normal. Pupils are equal, round, and reactive to light.  Neck: Normal range of motion. Neck supple. No thyromegaly present.  Cardiovascular: Normal rate and intact distal pulses.  An irregular rhythm present.  No murmur heard. Pulmonary/Chest: Effort normal and breath sounds normal. No respiratory distress. He has no wheezes. He has no rales.  Abdominal: Soft. Normal appearance and bowel sounds are normal. He exhibits no distension and no mass. There is no hepatosplenomegaly. There is no tenderness. There is no rigidity, no rebound, no guarding, no CVA tenderness and negative Murphy's sign.  Musculoskeletal: He exhibits no edema.  Lymphadenopathy:    He has no cervical adenopathy.  Skin: Skin is warm and dry. No rash noted. No erythema.  Psychiatric: He has a normal mood and affect.  Nursing note and vitals reviewed.  Results for orders placed or performed in visit on 06/21/16  Lipid panel  Result Value Ref Range   Cholesterol 161 0 - 200 mg/dL   Triglycerides 105.0 0.0 - 149.0 mg/dL   HDL 45.10 >39.00 mg/dL   VLDL 21.0 0.0 - 40.0 mg/dL   LDL Cholesterol 95 0 - 99 mg/dL   Total CHOL/HDL Ratio 4    NonHDL 116.33   Comprehensive metabolic panel  Result Value Ref Range   Sodium 144 135 - 145 mEq/L   Potassium 4.4 3.5 - 5.1 mEq/L   Chloride 108 96 - 112 mEq/L   CO2 29 19 - 32 mEq/L   Glucose, Bld 109 (H) 70 - 99  mg/dL   BUN 22 6 - 23 mg/dL   Creatinine, Ser 0.84 0.40 - 1.50 mg/dL   Total Bilirubin 1.2 0.2 - 1.2 mg/dL   Alkaline Phosphatase 59 39 - 117 U/L   AST 20 0 - 37 U/L   ALT 26 0 - 53 U/L   Total Protein 6.7 6.0 - 8.3 g/dL   Albumin 4.6 3.5 - 5.2 g/dL   Calcium 9.5 8.4 - 10.5 mg/dL   GFR 95.99 >60.00 mL/min  PSA, Medicare  Result Value Ref Range   PSA 2.11 0.10 - 4.00 ng/ml   EKG - sinus arrhythmia with frequent PACs, normal axis, no acute ST/T changes    Assessment & Plan:   Problem List Items Addressed This Visit    Atrial fibrillation ([place])    Concern for recurrent afib - check EKG today - sinus arrhythmia with PACs. Advised pt to monitor for palpitations or new cardiac symptoms to merit sooner f/u with Dr Rayann Heman.       Relevant Orders   EKG 12-Lead (Completed)   TSH   Body aches - Primary    In setting of multiple recent tick bites however no rash or true fever documented (although he feels feverish).  Will check for tick borne disease (borrelia, ehrlichia) as well as CBC, CMP.  Will treat empirically with 10d doxycycline course. Pt agrees with plan.       Relevant Orders   Comprehensive metabolic panel   CBC with Differential/Platelet   Lyme Ab/Western Blot Reflex   Ehrlichia Antibody Panel   Tick bite   Relevant Orders   Comprehensive metabolic panel   CBC with Differential/Platelet   Lyme Ab/Western Blot Reflex   Ehrlichia Antibody Panel       Follow up plan: Return if symptoms worsen or fail to improve.  Ria Bush, MD

## 2017-01-13 LAB — LYME AB/WESTERN BLOT REFLEX: B burgdorferi Ab IgG+IgM: <0.9 Index (ref <0.90)

## 2017-01-16 LAB — EHRLICHIA ANTIBODY PANEL
E chaffeensis (HGE) Ab, IgG: <1:64 {titer}
E chaffeensis (HGE) Ab, IgM: <1:20 {titer}

## 2017-06-28 ENCOUNTER — Ambulatory Visit: Payer: Medicare Other

## 2017-07-04 ENCOUNTER — Other Ambulatory Visit: Payer: Self-pay | Admitting: Family Medicine

## 2017-07-04 DIAGNOSIS — K7460 Unspecified cirrhosis of liver: Secondary | ICD-10-CM

## 2017-07-04 DIAGNOSIS — E785 Hyperlipidemia, unspecified: Secondary | ICD-10-CM

## 2017-07-04 DIAGNOSIS — K7581 Nonalcoholic steatohepatitis (NASH): Principal | ICD-10-CM

## 2017-07-04 DIAGNOSIS — Z125 Encounter for screening for malignant neoplasm of prostate: Secondary | ICD-10-CM

## 2017-07-05 ENCOUNTER — Encounter: Payer: Medicare Other | Admitting: Family Medicine

## 2017-07-07 ENCOUNTER — Ambulatory Visit (INDEPENDENT_AMBULATORY_CARE_PROVIDER_SITE_OTHER): Payer: Medicare Other

## 2017-07-07 ENCOUNTER — Other Ambulatory Visit (INDEPENDENT_AMBULATORY_CARE_PROVIDER_SITE_OTHER): Payer: Medicare Other

## 2017-07-07 VITALS — BP 126/80 | HR 73 | Temp 97.8°F | Ht 64.75 in | Wt 172.0 lb

## 2017-07-07 DIAGNOSIS — K7460 Unspecified cirrhosis of liver: Secondary | ICD-10-CM

## 2017-07-07 DIAGNOSIS — E785 Hyperlipidemia, unspecified: Secondary | ICD-10-CM

## 2017-07-07 DIAGNOSIS — Z0000 Encounter for general adult medical examination without abnormal findings: Secondary | ICD-10-CM

## 2017-07-07 DIAGNOSIS — K7581 Nonalcoholic steatohepatitis (NASH): Secondary | ICD-10-CM

## 2017-07-07 DIAGNOSIS — Z125 Encounter for screening for malignant neoplasm of prostate: Secondary | ICD-10-CM

## 2017-07-07 LAB — COMPREHENSIVE METABOLIC PANEL
ALT: 36 U/L (ref 0–53)
AST: 26 U/L (ref 0–37)
Albumin: 4.6 g/dL (ref 3.5–5.2)
Alkaline Phosphatase: 47 U/L (ref 39–117)
BUN: 20 mg/dL (ref 6–23)
CO2: 29 mEq/L (ref 19–32)
Calcium: 9.5 mg/dL (ref 8.4–10.5)
Chloride: 105 mEq/L (ref 96–112)
Creatinine, Ser: 0.84 mg/dL (ref 0.40–1.50)
GFR: 95.7 mL/min (ref >60.00)
Glucose, Bld: 110 mg/dL — ABNORMAL HIGH (ref 70–99)
Potassium: 4.3 mEq/L (ref 3.5–5.1)
Sodium: 139 mEq/L (ref 135–145)
Total Bilirubin: 2 mg/dL — ABNORMAL HIGH (ref 0.2–1.2)
Total Protein: 6.6 g/dL (ref 6.0–8.3)

## 2017-07-07 LAB — PSA, MEDICARE: PSA: 2.19 ng/ml (ref 0.10–4.00)

## 2017-07-07 LAB — LIPID PANEL
Cholesterol: 181 mg/dL (ref 0–200)
HDL: 45.9 mg/dL (ref >39.00)
LDL Cholesterol: 105 mg/dL — ABNORMAL HIGH (ref 0–99)
NonHDL: 135.21
Total CHOL/HDL Ratio: 4
Triglycerides: 149 mg/dL (ref 0.0–149.0)
VLDL: 29.8 mg/dL (ref 0.0–40.0)

## 2017-07-07 NOTE — Patient Instructions (Signed)
Mr. Alicea , Thank you for taking time to come for your Medicare Wellness Visit. I appreciate your ongoing commitment to your health goals. Please review the following plan we discussed and let me know if I can assist you in the future.   These are the goals we discussed: Goals    . Increase physical activity          Starting 07/07/2017, I will continue to exercise for at least 60 min daily.        This is a list of the screening recommended for you and due dates:  Health Maintenance  Topic Date Due  . Stool Blood Test  07/07/2018*  . DTaP/Tdap/Td vaccine (1 - Tdap) 03/26/2023*  . Tetanus Vaccine  03/26/2023  . Flu Shot  Completed  .  Hepatitis C: One time screening is recommended by Center for Disease Control  (CDC) for  adults born from [birth year] through 1965.   Completed  . Pneumonia vaccines  Completed  *Topic was postponed. The date shown is not the original due date.   Preventive Care for Adults  A healthy lifestyle and preventive care can promote health and wellness. Preventive health guidelines for adults include the following key practices.  . A routine yearly physical is a good way to check with your health care provider about your health and preventive screening. It is a chance to share any concerns and updates on your health and to receive a thorough exam.  . Visit your dentist for a routine exam and preventive care every 6 months. Brush your teeth twice a day and floss once a day. Good oral hygiene prevents tooth decay and gum disease.  . The frequency of eye exams is based on your age, health, family medical history, use  of contact lenses, and other factors. Follow your health care provider's recommendations for frequency of eye exams.  . Eat a healthy diet. Foods like vegetables, fruits, whole grains, low-fat dairy products, and lean protein foods contain the nutrients you need without too many calories. Decrease your intake of foods high in solid fats, added sugars, and  salt. Eat the right amount of calories for you. Get information about a proper diet from your health care provider, if necessary.  . Regular physical exercise is one of the most important things you can do for your health. Most adults should get at least 150 minutes of moderate-intensity exercise (any activity that increases your heart rate and causes you to sweat) each week. In addition, most adults need muscle-strengthening exercises on 2 or more days a week.  Silver Sneakers may be a benefit available to you. To determine eligibility, you may visit the website: www.silversneakers.com or contact program at 639-366-5575 Mon-Fri between 8AM-8PM.   . Maintain a healthy weight. The body mass index (BMI) is a screening tool to identify possible weight problems. It provides an estimate of body fat based on height and weight. Your health care provider can find your BMI and can help you achieve or maintain a healthy weight.   For adults 20 years and older: ? A BMI below 18.5 is considered underweight. ? A BMI of 18.5 to 24.9 is normal. ? A BMI of 25 to 29.9 is considered overweight. ? A BMI of 30 and above is considered obese.   . Maintain normal blood lipids and cholesterol levels by exercising and minimizing your intake of saturated fat. Eat a balanced diet with plenty of fruit and vegetables. Blood tests for lipids and cholesterol  should begin at age 58 and be repeated every 5 years. If your lipid or cholesterol levels are high, you are over 50, or you are at high risk for heart disease, you may need your cholesterol levels checked more frequently. Ongoing high lipid and cholesterol levels should be treated with medicines if diet and exercise are not working.  . If you smoke, find out from your health care provider how to quit. If you do not use tobacco, please do not start.  . If you choose to drink alcohol, please do not consume more than 2 drinks per day. One drink is considered to be 12 ounces  (355 mL) of beer, 5 ounces (148 mL) of wine, or 1.5 ounces (44 mL) of liquor.  . If you are 39-70 years old, ask your health care provider if you should take aspirin to prevent strokes.  . Use sunscreen. Apply sunscreen liberally and repeatedly throughout the day. You should seek shade when your shadow is shorter than you. Protect yourself by wearing long sleeves, pants, a wide-brimmed hat, and sunglasses year round, whenever you are outdoors.  . Once a month, do a whole body skin exam, using a mirror to look at the skin on your back. Tell your health care provider of new moles, moles that have irregular borders, moles that are larger than a pencil eraser, or moles that have changed in shape or color.

## 2017-07-07 NOTE — Progress Notes (Signed)
Pre visit review using our clinic review tool, if applicable. No additional management support is needed unless otherwise documented below in the visit note. 

## 2017-07-07 NOTE — Progress Notes (Signed)
Subjective:   Ernest Bell is a 71 y.o. male who presents for Medicare Annual/Subsequent preventive examination.  Review of Systems:  N/A Cardiac Risk Factors include: advanced age (>59men, >26 women);male gender;dyslipidemia     Objective:    Vitals: BP 126/80 (BP Location: Right Arm, Patient Position: Sitting, Cuff Size: Normal)   Pulse 73   Temp 97.8 F (36.6 C) (Oral)   Ht 5' 4.75" (1.645 m) Comment: no shoes  Wt 172 lb (78 kg)   SpO2 98%   BMI 28.84 kg/m   Body mass index is 28.84 kg/m.  Tobacco History  Smoking Status  . Never Smoker  Smokeless Tobacco  . Never Used     Counseling given: No   Past Medical History:  Diagnosis Date  . Atrial fib/flutter, transient 04/28/10   typical appearing atrial flutter dx 04/28/10 s/p CTI ablation 10/11  . BPH (benign prostatic hyperplasia)    PSA stable, followed by Dr. Eliberto Ivory  . CHF (congestive heart failure) ([place])   . 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 Coronary artery disease   . Depression    on sertraline  . DJD (degenerative joint disease)   . Gout   . Liver cirrhosis secondary to NASH (nonalcoholic steatohepatitis) ([place]) 2001   ?by liver biopsy  . Persistent atrial fibrillation ([place]) 09/15/10   S/P PVI ([place]) at [facility]  . Syncope 1997   recurrent S/P tilt study by Dr. [name] (like due to dysautonomia)   Past Surgical History:  Procedure Laterality Date  . afib abation  09/15/10   [place] at [place], CTI ablation at [facility] 04/28/10  . CHOLECYSTECTOMY  1995  . COLONOSCOPY  02/2000   2 hyperplastic polyps, [name]  . ESOPHAGOGASTRODUODENOSCOPY  02/2000   [name]  . HEMORROIDECTOMY  2007  . KNEE SURGERY  2007   he had osteoarthritis with left knee surgery  . pericardioectomy  1969   [facility]  . right thumb surger     for osteoarthritis   Family History  Problem Relation Age of Onset  . Hypertension Mother   . Alcohol abuse Father     smoker  . Heart disease Father        MI (smoker)   History  Sexual Activity  . Sexual activity: Yes    Outpatient Encounter Prescriptions as of 07/07/2017  Medication Sig  . ibuprofen (ADVIL,MOTRIN) 800 MG tablet Take 1 tablet by mouth every 8 (eight) hours as needed for pain.  . Tamsulosin HCl (FLOMAX) 0.4 MG CAPS Take 0.4 mg by mouth 2 (two) times daily.   . [DISCONTINUED] doxycycline (VIBRA-TABS) 100 MG tablet Take 1 tablet (100 mg total) by mouth 2 (two) times daily.  . [DISCONTINUED] ondansetron (ZOFRAN) 4 MG tablet Take 1 tablet (4 mg total) by mouth every 8 (eight) hours as needed for nausea or vomiting.   No facility-administered encounter medications on file as of 07/07/2017.     Activities of Daily Living In your present state of health, do you have any difficulty performing the following activities: 07/07/2017  Hearing? N  Vision? N  Difficulty concentrating or making decisions? N  Walking or climbing stairs? N  Dressing or bathing? N  Doing errands, shopping? N  Preparing Food and eating ? N  Using the Toilet? N  In the past six months, have you accidently leaked urine? N  Do you have problems with loss of bowel control? N  Managing your Medications?  N  Managing your Finances? N  Housekeeping or managing your Housekeeping? N  Some recent data might be hidden    Patient Care Team: Ria Bush, MD as PCP - General (Family Medicine) [name], MD as Consulting Physician (Cardiology) Royston Cowper, MD as Consulting Physician (Urology)   Assessment:     Hearing Screening   125Hz  250Hz  500Hz  1000Hz  2000Hz  3000Hz  4000Hz  6000Hz  8000Hz   Right ear:   40 40 40  0    Left ear:   40 0 0  0    Vision Screening Comments: Last vision exam in Winter 2017 @ [place] with Dr. Matilde Sprang   Exercise Activities and Dietary recommendations Current Exercise Habits: Home exercise routine, Type of exercise: walking;Other - see comments (tennis, hunting, fishing, yard work),  Time (Minutes): 60, Frequency (Times/Week): 7, Weekly Exercise (Minutes/Week): 420, Intensity: Moderate, Exercise limited by: None identified  Goals    . Increase physical activity          Starting 07/07/2017, I will continue to exercise for at least 60 min daily.       Fall Risk Fall Risk  07/07/2017 04/15/2016 03/03/2014  Falls in the past year? No No No   Depression Screen PHQ 2/9 Scores 07/07/2017 04/15/2016 03/03/2014  PHQ - 2 Score 0 0 0  PHQ- 9 Score 0 - -    Cognitive Function MMSE - Mini Mental State Exam 07/07/2017 04/15/2016  Orientation to time 5 5  Orientation to Place 5 5  Registration 3 3  Attention/ Calculation 0 0  Recall 3 3  Language- name 2 objects 0 0  Language- repeat 1 1  Language- follow 3 step command 3 3  Language- read & follow direction 0 0  Write a sentence 0 0  Copy design 0 0  Total score 20 20       PLEASE NOTE: A Mini-Cog screen was completed. Maximum score is 20. A value of 0 denotes this part of [place] MMSE was not completed or the patient failed this part of the Mini-Cog screening.   Mini-Cog Screening Orientation to Time - Max 5 pts Orientation to Place - Max 5 pts Registration - Max 3 pts Recall - Max 3 pts Language Repeat - Max 1 pts Language Follow 3 Step Command - Max 3 pts   Immunization History  Administered Date(s) Administered  . Influenza Whole 05/07/2011  . Influenza, Seasonal, Injecte, Preservative Fre 08/18/2015  . Influenza,inj,Quad PF,6+ Mos 06/27/2016  . Influenza-Unspecified 06/23/2017  . Pneumococcal Conjugate-13 03/03/2014  . Pneumococcal Polysaccharide-23 07/26/2011  . Td 01/26/2010, 03/25/2013  . Zoster 08/18/2015   Screening Tests Health Maintenance  Topic Date Due  . COLON CANCER SCREENING ANNUAL FOBT  07/07/2018 (Originally 05/16/1996)  . DTaP/Tdap/Td (1 - Tdap) 03/26/2023 (Originally 03/26/2013)  . TETANUS/TDAP  03/26/2023  . INFLUENZA VACCINE  Completed  . Hepatitis C Screening  Completed  . PNA vac  Low Risk Adult  Completed      Plan:   I have personally reviewed, addressed, and noted the following in the patient's chart:  A. Medical and social history B. Use of alcohol, tobacco or illicit drugs  C. Current medications and supplements D. Functional ability and status E.  Nutritional status F.  Physical activity G. Advance directives H. List of other physicians I.  Hospitalizations, surgeries, and ER visits in previous 12 months J.  [place] to include hearing, vision, cognitive, depression L. Referrals and appointments - none  In addition, I have reviewed  and discussed with patient certain preventive protocols, quality metrics, and best practice recommendations. A written personalized care plan for preventive services as well as general preventive health recommendations were provided to patient.  See attached scanned questionnaire for additional information.   Signed,   Lindell Noe, MHA, BS, LPN Health Coach

## 2017-07-07 NOTE — Progress Notes (Signed)
PCP notes:   Health maintenance:  Colon cancer screening - PCP please discuss at next appt  Abnormal screenings:   Hearing - failed  Hearing Screening   125Hz  250Hz  500Hz  1000Hz  2000Hz  3000Hz  4000Hz  6000Hz  8000Hz   Right ear:   40 40 40  0    Left ear:   40 0 0  0     Patient concerns:   None  Nurse concerns:  None  Next PCP appt:   07/20/17 @ 0845  I reviewed health advisor's note, was available for consultation on the day of service listed in this note, and agree with documentation and plan. Elsie Stain, MD.

## 2017-07-20 ENCOUNTER — Encounter: Payer: Medicare Other | Admitting: Family Medicine

## 2018-03-29 ENCOUNTER — Telehealth: Payer: Self-pay | Admitting: Family Medicine

## 2018-03-29 NOTE — Telephone Encounter (Signed)
Copied from [place] [id number]. Topic: Quick Communication - See Telephone Encounter >> Mar 29, 2018  5:18 PM Neva Seat wrote: Asking if there is anyone can do a EKG on pt.  Irregular heartbeat off an on.  Cardiologist NP checked pt at home today 03-29-18 through his AWV.  Asking if pt can have an EKG done at the office as early tomorrow if possible. Please call pt's wife back to let her know.

## 2018-03-30 ENCOUNTER — Telehealth: Payer: Self-pay | Admitting: Cardiovascular Disease

## 2018-03-30 ENCOUNTER — Encounter: Payer: Self-pay | Admitting: Physician Assistant

## 2018-03-30 ENCOUNTER — Ambulatory Visit: Payer: Medicare Other | Admitting: Physician Assistant

## 2018-03-30 ENCOUNTER — Telehealth: Payer: Self-pay | Admitting: Internal Medicine

## 2018-03-30 VITALS — BP 150/88 | HR 69 | Ht 66.0 in | Wt 164.0 lb

## 2018-03-30 DIAGNOSIS — R030 Elevated blood-pressure reading, without diagnosis of hypertension: Secondary | ICD-10-CM

## 2018-03-30 DIAGNOSIS — I4891 Unspecified atrial fibrillation: Secondary | ICD-10-CM | POA: Diagnosis not present

## 2018-03-30 NOTE — Telephone Encounter (Signed)
I spoke with Mrs Elza; pt is feeling OK this morning and has already got a call in to the cardiologist about his afib and being seen today for EKG. Mrs Yuan said if pt develops CP, SOB, afib worsens or fast heart beat will take pt to ED. FYI to Dr Darnell Level.

## 2018-03-30 NOTE — Telephone Encounter (Signed)
I spoke with pt. Home health nurse saw him yesterday and thought he was in afib.  Pt reports he is feeling fine.  I scheduled him to see Tommye Standard today at 10:15.  Pt aware this is in [place] at [facility] office.

## 2018-03-30 NOTE — Patient Instructions (Addendum)
Medication Instructions:  Your physician recommends that you continue on your current medications as directed. Please refer to the Current Medication list given to you today.   Labwork: None ordered  Testing/Procedures: None ordered  Follow-Up: Your physician wants you to follow-up in: [place], PA-C  You will receive a reminder letter in the mail two months in advance. If you don't receive a letter, please call our office to schedule the follow-up appointment.   Any Other Special Instructions Will Be Listed Below (If Applicable).  MONITOR YOUR BLOOD PRESSURE AT HOME AND CALL IF YOU NOTICED THAT IT IS ROUTINELY OVER 140/80   If you need a refill on your cardiac medications before your next appointment, please call your pharmacy.

## 2018-03-30 NOTE — Telephone Encounter (Signed)
Nurse from [facility] office called patient. He is going there for an appointment today.

## 2018-03-30 NOTE — Telephone Encounter (Signed)
Pt calling stating late yesterday he had a in home wellness check with nurse  She told patient he needed to be seen ASAP for he was back in Afib and was at risk of stroke He is scheduled to see Dr Rockey Situ 04/16/18 but is very concerned he states she made it sound like it was life or death He is not sure if he needs to go ER or not  Please advise

## 2018-03-30 NOTE — Telephone Encounter (Signed)
Noted. Thanks. Defer to PCP.

## 2018-03-30 NOTE — Progress Notes (Signed)
Cardiology Office Note Date:  03/30/2018  Patient ID:  Ernest Bell, Ernest Bell March 02, 1946, MRN [id number] PCP:  Ria Bush, MD  Cardiologist:  Dr. Cephus Shelling Electrophysiologist: Dr. Rayann Heman   Chief Complaint: irregular pulse  History of Present Illness: Ernest Bell is a 72 y.o. male with history of restrictive pericarditis s/p pericardectomy in 1969, NASH/cirrhosis, AFib/flutter (hx reports typical flutter ablation 04/28/10, and PVI cryoablation Oct. 2011).  He Bell today as a work-in/add-on appointment for suspected AFib noted by exam at a well-care RN visit by his insurance company. He last saw Dr. Rayann Heman Oct 2017, at that time he was doing well without symptoms of AFib.  He was off a/c with CHA2DS2Vasc sore of one, his flecainide reduced to 25mg  BID (also on dilt) with thoughts of stopping it at annual f/u if no AF.    He feels great.  He has been off his flecainide and diltiazem since he saw Dr. Rayann Heman last in 2017.  He will appreciate some extra beats on/off some nights, they are brief and not symptomatic with them.  He plays tennis regularly, last night 2 hours.  He is very active, no exertional intolerances, no CP or SOB, no DOE.  He denies any dizziness, near syncope or syncope.  His PMD monitors his lipids that are reported to be good.  He has never had a high BP reading before, and denies any changes in his medical history, no new medical problems.  The health RN visit was a routine check in for his insurance.  He felt well, recalls his BP 130/something, does not recall the HR being mentioned as high or low, just that his rhythm was irregular.  He was asymptomatic and played his tennis afterwards without difficulty.  He has not felt like he has had afib since his ablation.  Past Medical History:  Diagnosis Date  . Atrial fib/flutter, transient 04/28/10   typical appearing atrial flutter dx 04/28/10 s/p CTI ablation 10/11  . BPH (benign prostatic hyperplasia)    PSA stable,  followed by Dr. Eliberto Bell  . CHF (congestive heart failure) ([place])   . 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 Coronary artery disease   . Depression    on sertraline  . DJD (degenerative joint disease)   . Gout   . Liver cirrhosis secondary to NASH (nonalcoholic steatohepatitis) ([place]) 2001   ?by liver biopsy  . Persistent atrial fibrillation ([place]) 09/15/10   S/P PVI (Cryo) at [facility]  . Syncope 1997   recurrent S/P tilt study by Dr. Caryl Bell (like due to dysautonomia)    Past Surgical History:  Procedure Laterality Date  . afib abation  09/15/10   Cryo at [place], CTI ablation at [facility] 04/28/10  . CHOLECYSTECTOMY  1995  . COLONOSCOPY  02/2000   2 hyperplastic polyps, IBS Ernest Bell)  . ESOPHAGOGASTRODUODENOSCOPY  02/2000   [facility] Ernest Bell)  . HEMORROIDECTOMY  2007  . KNEE SURGERY  2007   he had osteoarthritis with left knee surgery  . pericardioectomy  1969   at [facility]  . right thumb surger     for osteoarthritis    Current Outpatient Medications  Medication Sig Dispense Refill  . ibuprofen (ADVIL,MOTRIN) 800 MG tablet Take 1 tablet by mouth every 8 (eight) hours as needed for pain.  1  . Tamsulosin HCl (FLOMAX) 0.4 MG CAPS Take 0.4 mg by mouth 2 (two) times daily.      No current facility-administered medications for  this visit.     Allergies:   Penicillins   Social History:  The patient  reports that he has never smoked. He has never used smokeless tobacco. He reports that he does not drink alcohol or use drugs.   Family History:  The patient's family history includes Alcohol abuse in his father; Heart disease in his father; Hypertension in his mother.  ROS:  Please see the history of present illness.    All other systems are reviewed and otherwise negative.   PHYSICAL EXAM:  VS:  BP (!) 150/88   Pulse 69   Ht 5\' 6"  (1.676 m)   Wt 164 lb (74.4 kg)   SpO2 98%   BMI 26.47 kg/m  BMI: Body mass index is 26.47 kg/m. Well  nourished, well developed, in no acute distress  HEENT: normocephalic, atraumatic  Neck: no JVD, carotid bruits or masses Cardiac:  RRR; no significant murmurs, no rubs, or gallops Lungs:  CTA b/l, no wheezing, rhonchi or rales  Abd: soft, nontender MS: no deformity or atrophy Ext: no edema  Skin: warm and dry, no rash Neuro:  No gross deficits appreciated Psych: euthymic mood, full affect   EKG:  Done today and reviewed by myself SR, PACs  05/20/16: TTE Study Conclusions - Left ventricle: The cavity size was normal. Wall thickness was   normal. Systolic function was normal. The estimated ejection   fraction was in the range of 60% to 65%. Wall motion was normal;   there were no regional wall motion abnormalities. The study is   not technically sufficient to allow evaluation of LV diastolic   function. - Mitral valve: There was mild to moderate regurgitation. - Left atrium: The atrium was mildly dilated. - Pulmonic valve: There was moderate regurgitation. - Pulmonary arteries: Systolic pressure was within the normal   range.  Recent Labs: 07/07/2017: ALT 36; BUN 20; Creatinine, Ser 0.84; Potassium 4.3; Sodium 139  07/07/2017: Cholesterol 181; HDL 45.90; LDL Cholesterol 105; Total CHOL/HDL Ratio 4; Triglycerides 149.0; VLDL 29.8   CrCl cannot be calculated (Patient's most recent lab result is older than the maximum 21 days allowed.).   Wt Readings from Last 3 Encounters:  03/30/18 164 lb (74.4 kg)  07/07/17 172 lb (78 kg)  01/12/17 166 lb 12.8 oz (75.7 kg)     Other studies reviewed: Additional studies/records reviewed today include: summarized above  ASSESSMENT AND PLAN:  1. AFib     CHA2DS2Vasc is one for age  He is in [place] today with PACs, in review of his last couple EKGs in the last couple years, he has had PACs The patient states he has had some extra beats on/off all along  2. Elevated BP today     A recheck is 146/80     Discussed importance of BP if he is  devloping HTN     This is the first time he has had higher readings     encouraged healthy eating avoiding canned foods, salty foods. And he is asked to keep an eye on this  Disposition: F/u with [place] in 6 mo sooner if needed.  He is asked to let us know if he finds his BP routinely elevated, and/or if he feels he has AF.  Current medicines are reviewed at length with the patient today.  The patient did not have any concerns regarding medicines.  Venetia Night, PA-C 03/30/2018 10:44 AM     [place]  [phone] (office)  [phone] (fax)

## 2018-03-30 NOTE — Telephone Encounter (Signed)
Noted. Pt seeing cards today.

## 2018-03-30 NOTE — Telephone Encounter (Signed)
New Message      Patient had a home wellness check yesterday, and per the [facility]  Nurse she states that he is in A-Fib. Pls advise.

## 2018-04-16 ENCOUNTER — Ambulatory Visit: Payer: Medicare Other | Admitting: Cardiovascular Disease

## 2018-04-16 ENCOUNTER — Encounter

## 2018-07-15 ENCOUNTER — Other Ambulatory Visit: Payer: Self-pay | Admitting: Family Medicine

## 2018-07-15 DIAGNOSIS — I4891 Unspecified atrial fibrillation: Secondary | ICD-10-CM

## 2018-07-15 DIAGNOSIS — Z125 Encounter for screening for malignant neoplasm of prostate: Secondary | ICD-10-CM

## 2018-07-15 DIAGNOSIS — K7581 Nonalcoholic steatohepatitis (NASH): Principal | ICD-10-CM

## 2018-07-15 DIAGNOSIS — K7460 Unspecified cirrhosis of liver: Secondary | ICD-10-CM

## 2018-07-15 DIAGNOSIS — E785 Hyperlipidemia, unspecified: Secondary | ICD-10-CM

## 2018-07-16 ENCOUNTER — Ambulatory Visit (INDEPENDENT_AMBULATORY_CARE_PROVIDER_SITE_OTHER): Payer: Medicare Other

## 2018-07-16 VITALS — BP 120/60 | HR 68 | Temp 98.5°F | Ht 66.0 in | Wt 172.5 lb

## 2018-07-16 DIAGNOSIS — K7460 Unspecified cirrhosis of liver: Secondary | ICD-10-CM | POA: Diagnosis not present

## 2018-07-16 DIAGNOSIS — Z0000 Encounter for general adult medical examination without abnormal findings: Secondary | ICD-10-CM | POA: Diagnosis not present

## 2018-07-16 DIAGNOSIS — I4891 Unspecified atrial fibrillation: Secondary | ICD-10-CM | POA: Diagnosis not present

## 2018-07-16 DIAGNOSIS — Z125 Encounter for screening for malignant neoplasm of prostate: Secondary | ICD-10-CM | POA: Diagnosis not present

## 2018-07-16 DIAGNOSIS — K7581 Nonalcoholic steatohepatitis (NASH): Secondary | ICD-10-CM | POA: Diagnosis not present

## 2018-07-16 DIAGNOSIS — E785 Hyperlipidemia, unspecified: Secondary | ICD-10-CM

## 2018-07-16 LAB — LIPID PANEL
Cholesterol: 164 mg/dL (ref 0–200)
HDL: 43.8 mg/dL (ref >39.00)
LDL Cholesterol: 99 mg/dL (ref 0–99)
NonHDL: 120.56
Total CHOL/HDL Ratio: 4
Triglycerides: 110 mg/dL (ref 0.0–149.0)
VLDL: 22 mg/dL (ref 0.0–40.0)

## 2018-07-16 LAB — CBC WITH DIFFERENTIAL/PLATELET
Basophils Absolute: 0 10*3/uL (ref 0.0–0.1)
Basophils Relative: 0.7 % (ref 0.0–3.0)
Eosinophils Absolute: 0.2 10*3/uL (ref 0.0–0.7)
Eosinophils Relative: 2.9 % (ref 0.0–5.0)
HCT: 43.8 % (ref 39.0–52.0)
Hemoglobin: 15.3 g/dL (ref 13.0–17.0)
Lymphocytes Relative: 31.9 % (ref 12.0–46.0)
Lymphs Abs: 2 10*3/uL (ref 0.7–4.0)
MCHC: 34.8 g/dL (ref 30.0–36.0)
MCV: 89.6 fl (ref 78.0–100.0)
Monocytes Absolute: 0.5 10*3/uL (ref 0.1–1.0)
Monocytes Relative: 7.9 % (ref 3.0–12.0)
Neutro Abs: 3.5 10*3/uL (ref 1.4–7.7)
Neutrophils Relative %: 56.6 % (ref 43.0–77.0)
Platelets: 200 10*3/uL (ref 150.0–400.0)
RBC: 4.89 Mil/uL (ref 4.22–5.81)
RDW: 13.1 % (ref 11.5–15.5)
WBC: 6.2 10*3/uL (ref 4.0–10.5)

## 2018-07-16 LAB — COMPREHENSIVE METABOLIC PANEL
ALT: 23 U/L (ref 0–53)
AST: 18 U/L (ref 0–37)
Albumin: 4.5 g/dL (ref 3.5–5.2)
Alkaline Phosphatase: 52 U/L (ref 39–117)
BUN: 19 mg/dL (ref 6–23)
CO2: 29 mEq/L (ref 19–32)
Calcium: 9.4 mg/dL (ref 8.4–10.5)
Chloride: 106 mEq/L (ref 96–112)
Creatinine, Ser: 1 mg/dL (ref 0.40–1.50)
GFR: 78.03 mL/min (ref >60.00)
Glucose, Bld: 162 mg/dL — ABNORMAL HIGH (ref 70–99)
Potassium: 4.7 mEq/L (ref 3.5–5.1)
Sodium: 141 mEq/L (ref 135–145)
Total Bilirubin: 2 mg/dL — ABNORMAL HIGH (ref 0.2–1.2)
Total Protein: 6.5 g/dL (ref 6.0–8.3)

## 2018-07-16 LAB — PSA, MEDICARE: PSA: 1.9 ng/ml (ref 0.10–4.00)

## 2018-07-16 NOTE — Patient Instructions (Signed)
Ernest Bell , Thank you for taking time to come for your Medicare Wellness Visit. I appreciate your ongoing commitment to your health goals. Please review the following plan we discussed and let me know if I can assist you in the future.   These are the goals we discussed: Goals    . Increase physical activity     Starting 07/16/2018, I will continue to exercise for at least 60 minutes daily.        This is a list of the screening recommended for you and due dates:  Health Maintenance  Topic Date Due  . Flu Shot  12/05/2018*  . Stool Blood Test  09/05/2019*  . Colon Cancer Screening  09/05/2019*  . DTaP/Tdap/Td vaccine (1 - Tdap) 03/26/2023*  . Tetanus Vaccine  03/26/2023  .  Hepatitis C: One time screening is recommended by Center for Disease Control  (CDC) for  adults born from [birth year] through 1965.   Completed  . Pneumonia vaccines  Completed  *Topic was postponed. The date shown is not the original due date.   Preventive Care for Adults  A healthy lifestyle and preventive care can promote health and wellness. Preventive health guidelines for adults include the following key practices.  . A routine yearly physical is a good way to check with your health care provider about your health and preventive screening. It is a chance to share any concerns and updates on your health and to receive a thorough exam.  . Visit your dentist for a routine exam and preventive care every 6 months. Brush your teeth twice a day and floss once a day. Good oral hygiene prevents tooth decay and gum disease.  . The frequency of eye exams is based on your age, health, family medical history, use  of contact lenses, and other factors. Follow your health care provider's recommendations for frequency of eye exams.  . Eat a healthy diet. Foods like vegetables, fruits, whole grains, low-fat dairy products, and lean protein foods contain the nutrients you need without too many calories. Decrease your intake of  foods high in solid fats, added sugars, and salt. Eat the right amount of calories for you. Get information about a proper diet from your health care provider, if necessary.  . Regular physical exercise is one of the most important things you can do for your health. Most adults should get at least 150 minutes of moderate-intensity exercise (any activity that increases your heart rate and causes you to sweat) each week. In addition, most adults need muscle-strengthening exercises on 2 or more days a week.  Silver Sneakers may be a benefit available to you. To determine eligibility, you may visit the website: www.silversneakers.com or contact program at 306-511-3154 Mon-Fri between 8AM-8PM.   . Maintain a healthy weight. The body mass index (BMI) is a screening tool to identify possible weight problems. It provides an estimate of body fat based on height and weight. Your health care provider can find your BMI and can help you achieve or maintain a healthy weight.   For adults 20 years and older: ? A BMI below 18.5 is considered underweight. ? A BMI of 18.5 to 24.9 is normal. ? A BMI of 25 to 29.9 is considered overweight. ? A BMI of 30 and above is considered obese.   . Maintain normal blood lipids and cholesterol levels by exercising and minimizing your intake of saturated fat. Eat a balanced diet with plenty of fruit and vegetables. Blood tests for lipids  and cholesterol should begin at age 29 and be repeated every 5 years. If your lipid or cholesterol levels are high, you are over 50, or you are at high risk for heart disease, you may need your cholesterol levels checked more frequently. Ongoing high lipid and cholesterol levels should be treated with medicines if diet and exercise are not working.  . If you smoke, find out from your health care provider how to quit. If you do not use tobacco, please do not start.  . If you choose to drink alcohol, please do not consume more than 2 drinks per  day. One drink is considered to be 12 ounces (355 mL) of beer, 5 ounces (148 mL) of wine, or 1.5 ounces (44 mL) of liquor.  . If you are 64-21 years old, ask your health care provider if you should take aspirin to prevent strokes.  . Use sunscreen. Apply sunscreen liberally and repeatedly throughout the day. You should seek shade when your shadow is shorter than you. Protect yourself by wearing long sleeves, pants, a wide-brimmed hat, and sunglasses year round, whenever you are outdoors.  . Once a month, do a whole body skin exam, using a mirror to look at the skin on your back. Tell your health care provider of new moles, moles that have irregular borders, moles that are larger than a pencil eraser, or moles that have changed in shape or color.

## 2018-07-16 NOTE — Progress Notes (Signed)
Subjective:   Ernest Bell is a 72 y.o. male who presents for Medicare Annual/Subsequent preventive examination.  Review of Systems:  N/A Cardiac Risk Factors include: advanced age (>39men, >20 women);male gender;dyslipidemia     Objective:    Vitals: BP 120/60 (BP Location: Right Arm, Patient Position: Sitting, Cuff Size: Normal)   Pulse 68   Temp 98.5 F (36.9 C) (Oral)   Ht 5\' 6"  (1.676 m) Comment: shoes  Wt 172 lb 8 oz (78.2 kg)   SpO2 98%   BMI 27.84 kg/m   Body mass index is 27.84 kg/m.  Advanced Directives 07/16/2018 07/07/2017 04/15/2016  Does Patient Have a Medical Advance Directive? Yes Yes Yes  Type of Paramedic of Ernest Bell  Does patient want to make changes to medical advance directive? - - No - Patient declined  Copy of Ernest Bell in Chart? No - copy requested No - copy requested No - copy requested    Tobacco Social History   Tobacco Use  Smoking Status Never Smoker  Smokeless Tobacco Never Used     Counseling given: No   Clinical Intake:  Pre-visit preparation completed: Yes  Pain : No/denies pain Pain Score: 0-No pain     Nutritional Status: BMI 25 -29 Overweight Nutritional Risks: None Diabetes: No  How often do you need to have someone help you when you read instructions, pamphlets, or other written materials from your doctor or pharmacy?: 1 - Never  Interpreter Needed?: No  Comments: pt lives with spouse Information entered by :: LPinson, LPN  Past Medical History:  Diagnosis Date  . Atrial fib/flutter, transient 04/28/10   typical appearing atrial flutter dx 04/28/10 s/p CTI ablation 10/11  . BPH (benign prostatic hyperplasia)    PSA stable, followed by Dr. Eliberto Ivory  . CHF (congestive heart failure) ([place])   . 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 Coronary artery  disease   . Depression    on sertraline  . DJD (degenerative joint disease)   . Gout   . Liver cirrhosis secondary to NASH (nonalcoholic steatohepatitis) ([place]) 2001   ?by liver biopsy  . Persistent atrial fibrillation 09/15/10   S/P [facility]VI (C[facility]o) at [facility]  . Syncope 1997   recurrent S/P tilt study by Dr. [name] (like due to dysautonomia)   Past Surgical History:  Procedure Laterality Date  . afib abation  09/15/10   Cryo at [place], CTI ablation at [facility] 04/28/10  . CHOLECYSTECTOMY  1995  . COLONOSCOPY  02/2000   2 hyperplastic polyps, [name]  . ESOPHAGOGASTRODUODENOSCOPY  02/2000   [name]  . HEMORROIDECTOMY  2007  . KNEE SURGERY  2007   he had osteoarthritis with left knee surgery  . pericardioectomy  1969   at [facility]  . right thumb surger     for osteoarthritis   Family History  Problem Relation Age of Onset  . Hypertension Mother   . Alcohol abuse Father        smoker  . Heart disease Father        MI (smoker)   Social History   Socioeconomic History  . Marital status: Married    Spouse name: Not on file  . Number of children: Not on file  . Years of education: Not on file  . Highest education level: Not on file  Occupational History  . Occupation: Disabled  School Product manager: UNEMPLOYED    Comment: previously worked as a Data processing manager  . Financial resource strain: Not on file  . Food insecurity:    Worry: Not on file    Inability: Not on file  . Transportation needs:    Medical: Not on file    Non-medical: Not on file  Tobacco Use  . Smoking status: Never Smoker  . Smokeless tobacco: Never Used  Substance and Sexual Activity  . Alcohol use: No  . Drug use: No  . Sexual activity: Yes  Lifestyle  . Physical activity:    Days per week: Not on file    Minutes per session: Not on file  . Stress: Not on file  Relationships  . Social connections:    Talks on phone: Not on file    Gets together: Not  on file    Attends religious service: Not on file    Active member of club or organization: Not on file    Attends meetings of clubs or organizations: Not on file    Relationship status: Not on file  Other Topics Concern  . 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   Diet: good water, fruits/vegetables daily    Outpatient Encounter Medications as of 07/16/2018  Medication Sig  . ibuprofen (ADVIL,MOTRIN) 800 MG tablet Take 1 tablet by mouth every 8 (eight) hours as needed for pain.  . Tamsulosin HCl (FLOMAX) 0.4 MG CAPS Take 0.4 mg by mouth 2 (two) times daily.    No facility-administered encounter medications on file as of 07/16/2018.     Activities of Daily Living In your present state of health, do you have any difficulty performing the following activities: 07/16/2018  Hearing? N  Vision? Y  Comment blurred vision in left eye  Difficulty concentrating or making decisions? N  Walking or climbing stairs? N  Dressing or bathing? N  Doing errands, shopping? N  Preparing Food and eating ? N  Using the Toilet? N  Do you have problems with loss of bowel control? N  Managing your Medications? N  Managing your Finances? N  Housekeeping or managing your Housekeeping? N  Some recent data might be hidden    Patient Care Team: Ria Bush, MD as PCP - General (Family Medicine) [name], MD as Consulting Physician (Cardiology) Royston Cowper, MD as Consulting Physician (Urology)   Assessment:   This is a routine wellness examination for [name].   Hearing Screening   125Hz  250Hz  500Hz  1000Hz  2000Hz  3000Hz  4000Hz  6000Hz  8000Hz   Right ear:   40 40 40  0    Left ear:   40 40 0  0      Visual Acuity Screening   Right eye Left eye Both eyes  Without correction: 20/25-1 20/50 20/20    With correction:       Exercise Activities and Dietary recommendations Current Exercise Habits: Home exercise routine, Type of exercise: walking;yoga(tennis, fishing, hunting), Time (Minutes): 60, Frequency (Times/Week): 7, Weekly Exercise (Minutes/Week): 420, Intensity: Moderate, Exercise limited by: None identified  Goals    . Increase physical activity     Starting 07/16/2018, I Bell continue to exercise for at least 60 minutes daily.        Fall  Risk Fall Risk  07/16/2018 07/07/2017 04/15/2016 03/03/2014  Falls in the past year? 0 No No No   Depression Screen PHQ 2/9 Scores 07/16/2018 07/07/2017 04/15/2016 03/03/2014  PHQ - 2 Score 0 0 0 0  PHQ- 9 Score 0 0 - -    Cognitive Function MMSE - Mini Mental State Exam 07/16/2018 07/07/2017 04/15/2016  Orientation to time 5 5 5   Orientation to Place 5 5 5   Registration 3 3 3   Attention/ Calculation 0 0 0  Recall 3 3 3   Language- name 2 objects 0 0 0  Language- repeat 1 1 1   Language- follow 3 step command 3 3 3   Language- read & follow direction 0 0 0  Write a sentence 0 0 0  Copy design 0 0 0  Total score 20 20 20      PLEASE NOTE: A Mini-Cog screen was completed. Maximum score is 20. A value of 0 denotes this part of Folstein MMSE was not completed or the patient failed this part of the Mini-Cog screening.   Mini-Cog Screening Orientation to Time - Max 5 pts Orientation to Place - Max 5 pts Registration - Max 3 pts Recall - Max 3 pts Language Repeat - Max 1 pts Language Follow 3 Step Command - Max 3 pts     Immunization History  Administered Date(s) Administered  . Influenza Whole 05/07/2011  . Influenza, Seasonal, Injecte, Preservative Fre 08/18/2015  . Influenza,inj,Quad PF,6+ Mos 06/27/2016  . Influenza-Unspecified 06/23/2017  . Pneumococcal Conjugate-13 03/03/2014  . Pneumococcal Polysaccharide-23 07/26/2011  . Td 01/26/2010, 03/25/2013  . Zoster 08/18/2015    Screening Tests Health Maintenance  Topic Date  Due  . INFLUENZA VACCINE  12/05/2018 (Originally 04/05/2018)  . COLON CANCER SCREENING ANNUAL FOBT  09/05/2019 (Originally 05/16/1996)  . COLONOSCOPY  09/05/2019 (Originally 02/15/2010)  . DTaP/Tdap/Td (1 - Tdap) 03/26/2023 (Originally 03/26/2013)  . TETANUS/TDAP  03/26/2023  . Hepatitis C Screening  Completed  . PNA vac Low Risk Adult  Completed       Plan:  I have personally reviewed, addressed, and noted the following in the patient's chart:  A. Medical and social history B. Use of alcohol, tobacco or illicit drugs  C. Current medications and supplements D. Functional ability and status E.  Nutritional status F.  Physical activity G. Advance directives H. List of other physicians I.  Hospitalizations, surgeries, and ER visits in previous 12 months J.  [place] to include hearing, vision, cognitive, depression L. Referrals and appointments - none  In addition, I have reviewed and discussed with patient certain preventive protocols, quality metrics, and best practice recommendations. A written personalized care plan for preventive services as well as general preventive health recommendations were provided to patient.  See attached scanned questionnaire for additional information.   Signed,   Lindell Noe, MHA, BS, LPN Health Coach

## 2018-07-16 NOTE — Progress Notes (Signed)
PCP notes:   Health maintenance:  Flu vaccine - addressed; will obtain at pharmacy Colon cancer screening - insurance provided FOBT kit  Abnormal screenings:   Hearing - failed  Hearing Screening   125Hz  250Hz  500Hz  1000Hz  2000Hz  3000Hz  4000Hz  6000Hz  8000Hz   Right ear:   40 40 40  0    Left ear:   40 40 0  0     Patient concerns:   None  Nurse concerns:  None  Next PCP appt:   07/23/2018 @ 1130

## 2018-07-23 ENCOUNTER — Encounter: Payer: Medicare Other | Admitting: Family Medicine

## 2018-08-21 ENCOUNTER — Encounter: Payer: Self-pay | Admitting: Family Medicine

## 2018-08-21 ENCOUNTER — Ambulatory Visit (INDEPENDENT_AMBULATORY_CARE_PROVIDER_SITE_OTHER): Payer: Medicare Other | Admitting: Family Medicine

## 2018-08-21 VITALS — BP 118/76 | HR 76 | Temp 97.8°F | Ht 66.0 in | Wt 171.5 lb

## 2018-08-21 DIAGNOSIS — I311 Chronic constrictive pericarditis: Secondary | ICD-10-CM

## 2018-08-21 DIAGNOSIS — Z1211 Encounter for screening for malignant neoplasm of colon: Secondary | ICD-10-CM | POA: Diagnosis not present

## 2018-08-21 DIAGNOSIS — Z23 Encounter for immunization: Secondary | ICD-10-CM | POA: Diagnosis not present

## 2018-08-21 DIAGNOSIS — E785 Hyperlipidemia, unspecified: Secondary | ICD-10-CM

## 2018-08-21 DIAGNOSIS — K7581 Nonalcoholic steatohepatitis (NASH): Secondary | ICD-10-CM

## 2018-08-21 DIAGNOSIS — K7460 Unspecified cirrhosis of liver: Secondary | ICD-10-CM

## 2018-08-21 DIAGNOSIS — R739 Hyperglycemia, unspecified: Secondary | ICD-10-CM | POA: Diagnosis not present

## 2018-08-21 DIAGNOSIS — R7303 Prediabetes: Secondary | ICD-10-CM | POA: Insufficient documentation

## 2018-08-21 DIAGNOSIS — Z0000 Encounter for general adult medical examination without abnormal findings: Secondary | ICD-10-CM | POA: Diagnosis not present

## 2018-08-21 DIAGNOSIS — I4891 Unspecified atrial fibrillation: Secondary | ICD-10-CM

## 2018-08-21 LAB — POCT GLYCOSYLATED HEMOGLOBIN (HGB A1C): Hemoglobin A1C: 5.5 % (ref 4.0–5.6)

## 2018-08-21 NOTE — Assessment & Plan Note (Addendum)
Will order abd [place] to monitor this

## 2018-08-21 NOTE — Assessment & Plan Note (Signed)
Appreciate cards care. 

## 2018-08-21 NOTE — Progress Notes (Signed)
BP 118/76 (BP Location: Left Arm, Patient Position: Sitting, Cuff Size: Normal)   Pulse 76   Temp 97.8 F (36.6 C) (Oral)   Ht 5\' 6"  (1.676 m)   Wt 171 lb 8 oz (77.8 kg)   SpO2 97%   BMI 27.68 kg/m    CC: CPE Subjective:    Patient ID: Ernest Bell, male    DOB: 06-04-1946, 72 y.o.   MRN: [id number]  HPI: Ernest Bell is a 72 y.o. male presenting on 08/21/2018 for Annual Exam (Pt 2.)   Saw Katha Cabal last month for medicare wellness visit. Note reviewed.    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 Feeling occasional flutters. Seen earlier in the year by cardiology with reassuring evaluation.   Known NASH cirrhosis.   Arthritis of both hands. Hasn't tried medications for this. No redness or swelling or warmth.   Preventative: COLONOSCOPY 02/2000 - 2 hyperplastic polyps, [name]. Has done IFOBT through insurance - agrees to [facility] through our office.  Prostate cancer screening - h/o BPH - sees Dr. Eliberto Ivory urologist yearly.  Flu shot - yearly Pneumovax 2012. prevnar 2016 Td 03/2013 zostavax - 08/2015 shingrix - discussed.  Advanced directives: has at home. HCPOA is wife. Will bring me copy. Seat belt use discussed Sunscreen use discussed. No changing moles on skin.  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  Diet: good water, fruits/vegetables daily.   Relevant past medical, surgical, family and social history reviewed and updated as indicated. Interim medical history since our last visit reviewed. Allergies and medications reviewed and updated. Outpatient Medications Prior to Visit  Medication Sig Dispense Refill  . ibuprofen (ADVIL,MOTRIN) 800 MG tablet Take 1 tablet by mouth every 8 (eight) hours as needed for pain.  1  . Tamsulosin HCl (FLOMAX)  0.4 MG CAPS Take 0.4 mg by mouth 2 (two) times daily.      No facility-administered medications prior to visit.      Per HPI unless specifically indicated in ROS section below Review of Systems  Constitutional: Negative for activity change, appetite change, chills, fatigue, fever and unexpected weight change.  HENT: Negative for hearing loss.   Eyes: Negative for visual disturbance.  Respiratory: Negative for cough, chest tightness, shortness of breath and wheezing.   Cardiovascular: Negative for chest pain, palpitations and leg swelling.  Gastrointestinal: Negative for abdominal distention, abdominal pain, blood in stool, constipation, diarrhea, nausea and vomiting.       Occasional loose stools  Genitourinary: Negative for difficulty urinating and hematuria.  Musculoskeletal: Negative for arthralgias, myalgias and neck pain.  Skin: Negative for rash.  Neurological: Negative for dizziness, seizures, syncope and headaches.  Hematological: Negative for adenopathy. Does not bruise/bleed easily.  Psychiatric/Behavioral: Negative for dysphoric mood. The patient is not nervous/anxious.        Objective:    BP 118/76 (BP Location: Left Arm, Patient Position: Sitting, Cuff Size: Normal)   Pulse 76   Temp 97.8 F (36.6 C) (Oral)   Ht 5\' 6"  (1.676 m)   Wt 171 lb 8 oz (77.8 kg)   SpO2 97%   BMI 27.68 kg/m   Wt Readings from Last 3 Encounters:  08/21/18 171 lb 8 oz (77.8 kg)  07/16/18 172 lb 8 oz (78.2 kg)  03/30/18 164 lb (74.4 kg)    Physical Exam Vitals signs and nursing note reviewed.  Constitutional:      General: He is not in acute distress.    Appearance: He is well-developed.  HENT:     Head: Normocephalic and atraumatic.     Right Ear: Hearing, tympanic membrane, ear canal and external ear normal.     Left Ear: Hearing, tympanic membrane, ear canal and external ear normal.     Nose: Nose normal.     Mouth/Throat:     Pharynx: Uvula midline. No oropharyngeal exudate or  posterior oropharyngeal erythema.  Eyes:     General: No scleral icterus.    Conjunctiva/sclera: Conjunctivae normal.     Pupils: Pupils are equal, round, and reactive to light.  Neck:     Musculoskeletal: Normal range of motion and neck supple.     Vascular: No carotid bruit.  Cardiovascular:     Rate and Rhythm: Normal rate and regular rhythm.     Pulses:          Radial pulses are 2+ on the right side and 2+ on the left side.     Heart sounds: Normal heart sounds. No murmur.  Pulmonary:     Effort: Pulmonary effort is normal. No respiratory distress.     Breath sounds: Normal breath sounds. No wheezing or rales.  Abdominal:     General: Bowel sounds are normal. There is no distension.     Palpations: Abdomen is soft. There is no mass.     Tenderness: There is no abdominal tenderness. There is no guarding or rebound.  Musculoskeletal: Normal range of motion.  Lymphadenopathy:     Cervical: No cervical adenopathy.  Skin:    General: Skin is warm and dry.     Findings: No rash.  Neurological:     Mental Status: He is alert and oriented to person, place, and time.     Comments: CN grossly intact, station and gait intact  Psychiatric:        Behavior: Behavior normal.        Thought Content: Thought content normal.        Judgment: Judgment normal.    Results for orders placed or performed in visit on 08/21/18  POCT glycosylated hemoglobin (Hb A1C)  Result Value Ref Range   Hemoglobin A1C 5.5 4.0 - 5.6 %   HbA1c POC (<> result, manual entry)     HbA1c, POC (prediabetic range)     HbA1c, POC (controlled diabetic range)     EKG - arrhythmia with rate variation, PACs with p waves present, not consistent with afib, normal axis.     Assessment & Plan:   Problem List Items Addressed This Visit    Liver cirrhosis secondary to NASH (nonalcoholic steatohepatitis) ([place])    Will order abd [place] to monitor this      Relevant Orders   US Abdomen Limited RUQ   Hyperglycemia    A1c  reassuring.       Relevant Orders   POCT glycosylated hemoglobin (Hb A1C) (Completed)   Hyperbilirubinemia   Health maintenance examination - Primary    Preventative protocols reviewed and updated unless pt declined. Discussed healthy diet and lifestyle.       Dyslipidemia    Chronic but stable, remains off medication. The 10-year ASCVD risk score Mikey Bussing DC Jr., et al., 2013) is: 20.2%   Values used to calculate the score:     Age: 19 years  Sex: Male     Is Non-Hispanic African American: No     Diabetic: No     Tobacco smoker: No     Systolic Blood Pressure: 299 mmHg     Is BP treated: Yes     HDL Cholesterol: 43.8 mg/dL     Total Cholesterol: 164 mg/dL       Constrictive pericarditis    Appreciate cards care       Atrial fibrillation [facility])    Concern for recurrent afib - check EKG today - overall reassuring.  CHA2DS2VASc score remains 1 for age.       Relevant Orders   EKG 12-Lead (Completed)    Other Visit Diagnoses    Need for influenza vaccination       Relevant Orders   Flu Vaccine QUAD 36+ mos IM (Completed)   Special screening for malignant neoplasms, colon       Relevant Orders   Fecal occult blood, imunochemical       No orders of the defined types were placed in this encounter.  Orders Placed This Encounter  Procedures  . Fecal occult blood, imunochemical    Standing Status:   Future    Standing Expiration Date:   08/22/2019  . US Abdomen Limited RUQ    Standing Status:   Future    Standing Expiration Date:   10/23/2019    Order Specific Question:   Reason for Exam (SYMPTOM  OR DIAGNOSIS REQUIRED)    Answer:   f/u cirrhosis    Order Specific Question:   Preferred imaging location?    Answer:   [facility]  . Flu Vaccine QUAD 36+ mos IM  . POCT glycosylated hemoglobin (Hb A1C)  . EKG 12-Lead    Follow up plan: Return in about 1 year (around 08/22/2019) for annual exam, prior fasting for blood work.  Ria Bush, MD

## 2018-08-21 NOTE — Assessment & Plan Note (Signed)
Chronic but stable, remains off medication. The 10-year ASCVD risk score Ernest Bell., et al., 2013) is: 20.2%   Values used to calculate the score:     Age: 72 years     Sex: Male     Is Non-Hispanic African American: No     Diabetic: No     Tobacco smoker: No     Systolic Blood Pressure: 624 mmHg     Is BP treated: Yes     HDL Cholesterol: 43.8 mg/dL     Total Cholesterol: 164 mg/dL

## 2018-08-21 NOTE — Assessment & Plan Note (Addendum)
Concern for recurrent afib - check EKG today - overall reassuring.  CHA2DS2VASc score remains 1 for age.

## 2018-08-21 NOTE — Patient Instructions (Addendum)
Flu shot today Pass by lab to pick up stool test.  If interested, check with pharmacy about new 2 shot shingles series (shingrix).  We will order liver ultrasound for monitoring.  Labs looking good, sugar was a bit high - check A1c today.  EKG today for irregular heart rate - overall looking ok.  Return as needed or in 1 year for next physical.  Health Maintenance, Male A healthy lifestyle and preventive care is important for your health and wellness. Ask your health care provider about what schedule of regular examinations is right for you. What should I know about weight and diet? Eat a Healthy Diet  Eat plenty of vegetables, fruits, whole grains, low-fat dairy products, and lean protein.  Do not eat a lot of foods high in solid fats, added sugars, or salt.  Maintain a Healthy Weight Regular exercise can help you achieve or maintain a healthy weight. You should:  Do at least 150 minutes of exercise each week. The exercise should increase your heart rate and make you sweat (moderate-intensity exercise).  Do strength-training exercises at least twice a week.  Watch Your Levels of Cholesterol and Blood Lipids  Have your blood tested for lipids and cholesterol every 5 years starting at 72 years of age. If you are at high risk for heart disease, you should start having your blood tested when you are 72 years old. You may need to have your cholesterol levels checked more often if: ? Your lipid or cholesterol levels are high. ? You are older than 72 years of age. ? You are at high risk for heart disease.  What should I know about cancer screening? Many types of cancers can be detected early and may often be prevented. Lung Cancer  You should be screened every year for lung cancer if: ? You are a current smoker who has smoked for at least 30 years. ? You are a former smoker who has quit within the past 15 years.  Talk to your health care provider about your screening options, when  you should start screening, and how often you should be screened.  Colorectal Cancer  Routine colorectal cancer screening usually begins at 72 years of age and should be repeated every 5-10 years until you are 72 years old. You may need to be screened more often if early forms of precancerous polyps or small growths are found. Your health care provider may recommend screening at an earlier age if you have risk factors for colon cancer.  Your health care provider may recommend using home test kits to check for hidden blood in the stool.  A small camera at the end of a tube can be used to examine your colon (sigmoidoscopy or colonoscopy). This checks for the earliest forms of colorectal cancer.  Prostate and Testicular Cancer  Depending on your age and overall health, your health care provider may do certain tests to screen for prostate and testicular cancer.  Talk to your health care provider about any symptoms or concerns you have about testicular or prostate cancer.  Skin Cancer  Check your skin from head to toe regularly.  Tell your health care provider about any new moles or changes in moles, especially if: ? There is a change in a mole's size, shape, or color. ? You have a mole that is larger than a pencil eraser.  Always use sunscreen. Apply sunscreen liberally and repeat throughout the day.  Protect yourself by wearing long sleeves, pants, a wide-brimmed hat,  and sunglasses when outside.  What should I know about heart disease, diabetes, and high blood pressure?  If you are 23-39 years of age, have your blood pressure checked every 3-5 years. If you are 66 years of age or older, have your blood pressure checked every year. You should have your blood pressure measured twice-once when you are at a hospital or clinic, and once when you are not at a hospital or clinic. Record the average of the two measurements. To check your blood pressure when you are not at a hospital or clinic,  you can use: ? An automated blood pressure machine at a pharmacy. ? A home blood pressure monitor.  Talk to your health care provider about your target blood pressure.  If you are between 69-67 years old, ask your health care provider if you should take aspirin to prevent heart disease.  Have regular diabetes screenings by checking your fasting blood sugar level. ? If you are at a normal weight and have a low risk for diabetes, have this test once every three years after the age of 13. ? If you are overweight and have a high risk for diabetes, consider being tested at a younger age or more often.  A one-time screening for abdominal aortic aneurysm (AAA) by ultrasound is recommended for men aged 32-75 years who are current or former smokers. What should I know about preventing infection? Hepatitis B If you have a higher risk for hepatitis B, you should be screened for this virus. Talk with your health care provider to find out if you are at risk for hepatitis B infection. Hepatitis C Blood testing is recommended for:  Everyone born from [birth year] through 1965.  Anyone with known risk factors for hepatitis C.  Sexually Transmitted Diseases (STDs)  You should be screened each year for STDs including gonorrhea and chlamydia if: ? You are sexually active and are younger than 72 years of age. ? You are older than 72 years of age and your health care provider tells you that you are at risk for this type of infection. ? Your sexual activity has changed since you were last screened and you are at an increased risk for chlamydia or gonorrhea. Ask your health care provider if you are at risk.  Talk with your health care provider about whether you are at high risk of being infected with HIV. Your health care provider may recommend a prescription medicine to help prevent HIV infection.  What else can I do?  Schedule regular health, dental, and eye exams.  Stay current with your vaccines  (immunizations).  Do not use any tobacco products, such as cigarettes, chewing tobacco, and e-cigarettes. If you need help quitting, ask your health care provider.  Limit alcohol intake to no more than 2 drinks per day. One drink equals 12 ounces of beer, 5 ounces of wine, or 1 ounces of hard liquor.  Do not use street drugs.  Do not share needles.  Ask your health care provider for help if you need support or information about quitting drugs.  Tell your health care provider if you often feel depressed.  Tell your health care provider if you have ever been abused or do not feel safe at home. This information is not intended to replace advice given to you by your health care provider. Make sure you discuss any questions you have with your health care provider. Document Released: 02/18/2008 Document Revised: 04/20/2016 Document Reviewed: 05/26/2015 Elsevier Interactive Patient Education  2018 Elsevier Inc.  

## 2018-08-21 NOTE — Assessment & Plan Note (Signed)
Preventative protocols reviewed and updated unless pt declined. Discussed healthy diet and lifestyle.  

## 2018-08-21 NOTE — Assessment & Plan Note (Signed)
A1c reassuring.

## 2018-08-21 NOTE — Assessment & Plan Note (Deleted)
Continue CPAP.  

## 2018-08-27 ENCOUNTER — Ambulatory Visit: Payer: Medicare Other

## 2018-08-29 NOTE — Progress Notes (Signed)
I reviewed health advisor's note, was available for consultation, and agree with documentation and plan.  

## 2018-09-17 ENCOUNTER — Ambulatory Visit
Admission: RE | Admit: 2018-09-17 | Discharge: 2018-09-17 | Disposition: A | Discharge status: Home / Self Care | Payer: Medicare Other | Source: Ambulatory Visit | Attending: Family Medicine | Admitting: Family Medicine

## 2018-09-17 DIAGNOSIS — K7460 Unspecified cirrhosis of liver: Secondary | ICD-10-CM | POA: Diagnosis present

## 2018-09-17 DIAGNOSIS — K7581 Nonalcoholic steatohepatitis (NASH): Secondary | ICD-10-CM | POA: Diagnosis present

## 2018-09-26 NOTE — Progress Notes (Signed)
Electrophysiology Office Note Date: 09/27/2018  ID:  Ernest Bell, DOB [birth year], MRN [id number]  PCP: Ria Bush, MD Electrophysiologist: Rayann Heman  CC: AF follow up  Ernest Bell is a 73 y.o. male seen today for Dr Rayann Heman.  He presents today for routine electrophysiology followup.  Since last being seen in our clinic, the patient reports doing very well.  He remains active playing tennis 3 days per week. He enjoys hunting and fishing as well. He continues to have intermittent palpitations that are stable over time. He notices them mostly at night. He has not had sustained AF that he is aware of. He denies chest pain, dyspnea, PND, orthopnea, nausea, vomiting, dizziness, syncope, edema, weight gain, or early satiety.  Past Medical History:  Diagnosis Date  . Atrial fib/flutter, transient 04/28/10   typical appearing atrial flutter dx 04/28/10 s/p CTI ablation 10/11  . BPH (benign prostatic hyperplasia)    PSA stable, followed by Dr. Eliberto Bell  . CHF (congestive heart failure) ([place])   . 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 Coronary artery disease   . Depression    on sertraline  . DJD (degenerative joint disease)   . Gout   . Liver cirrhosis secondary to NASH (nonalcoholic steatohepatitis) (Ernest Bell) 2001   ?by liver biopsy  . Persistent atrial fibrillation 09/15/10   S/P PVI (Cryo) at [facility]  . Syncope 1997   recurrent S/P tilt study by Dr. [name] (like due to dysautonomia)   Past Surgical History:  Procedure Laterality Date  . afib abation  09/15/10   Cryo at [place], [facility]I ablation at [facility] 04/28/10  . CHOLECYSTECTOMY  1995  . COLONOSCOPY  02/2000   2 hyperplastic polyps, [name]  . ESOPHAGOGASTRODUODENOSCOPY  02/2000   [name]  . HEMORROIDECTOMY  2007  . KNEE SURGERY  2007   he had osteoarthritis with left knee surgery  . PERICARDIECTOMY  1969   at [facility]  . right thumb surger     for osteoarthritis     Current Outpatient Medications  Medication Sig Dispense Refill  . ibuprofen (ADVIL,MOTRIN) 800 MG tablet Take 1 tablet by mouth every 8 (eight) hours as needed for pain.  1  . Tamsulosin HCl (FLOMAX) 0.4 MG CAPS Take 0.4 mg by mouth 2 (two) times daily.      No current facility-administered medications for this visit.     Allergies:   Penicillins   Social History: Social History   Socioeconomic History  . Marital status: Married    Spouse name: Not on file  . Number of children: Not on file  . Years of education: Not on file  . Highest education level: Not on file  Occupational History  . 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 Financial resource strain: Not on file  . Food insecurity:    Worry: Not on file    Inability: Not on file  . Transportation needs:    Medical: Not on file    Non-medical: Not on file  Tobacco Use  . Smoking status: Never Smoker  . Smokeless tobacco: Never Used  Substance and Sexual Activity  . Alcohol use: No  . Drug use: No  . Sexual activity: Yes  Lifestyle  . Physical activity:    Days per week: Not on file    Minutes per session: Not on file  .  Stress: Not on file  Relationships  . Social connections:    Talks on phone: Not on file    Gets together: Not on file    Attends religious service: Not on file    Active member of club or organization: Not on file    Attends meetings of clubs or organizations: Not on file    Relationship status: Not on file  . Intimate partner violence:    Fear of current or ex partner: Not on file    Emotionally abused: Not on file    Physically abused: Not on file    Forced sexual activity: Not on file  Other Topics Concern  . 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   Diet: good water, fruits/vegetables daily    Family History: Family History  Problem Relation Age of Onset  . Hypertension Mother   . Alcohol abuse Father        smoker  . Heart disease Father        MI (smoker)    Review of Systems: All other systems reviewed and are otherwise negative except as noted above.   Physical Exam: VS:  BP 140/74   Pulse 72   Ht 5\' 7"  (1.702 m)   Wt 173 lb (78.5 kg)   BMI 27.10 kg/m  , BMI Body mass index is 27.1 kg/m. Wt Readings from Last 3 Encounters:  09/27/18 173 lb (78.5 kg)  08/21/18 171 lb 8 oz (77.8 kg)  07/16/18 172 lb 8 oz (78.2 kg)    GEN- The patient is well appearing, alert and oriented x 3 today.   HEENT: normocephalic, atraumatic; sclera clear, conjunctiva pink; hearing intact; oropharynx clear; neck supple Lungs- Clear to ausculation bilaterally, normal work of breathing.  No wheezes, rales, rhonchi Heart- Regular rate and rhythm GI- soft, non-tender, non-distended, bowel sounds present Extremities- no clubbing, cyanosis, or edema MS- no significant deformity or atrophy Skin- warm and dry, no rash or lesion  Psych- euthymic mood, full affect Neuro- strength and sensation are intact   EKG:  EKG is ordered today. The ekg ordered today shows sinus rhythm, PAC's   Recent Labs: 07/16/2018: ALT 23; BUN 19; Creatinine, Ser 1.00; Hemoglobin 15.3; Platelets 200.0; Potassium 4.7; Sodium 141    Other studies Reviewed: Additional studies/ records that were reviewed today include: Dr Rayann Heman and Renee's office notes  Assessment and Plan:  1.  Paroxysmal atrial fibrillation Doing well off AAD therapy without recurrent AF CHADS2VASC is 1, no OAC Will give prn Diltiazem 30mg  to take for PAC's. Side effects reviewed.    Current medicines are reviewed at length with the patient today.   The patient does not have concerns regarding his medicines.  The following changes were made today:   none  Labs/ tests ordered today include: none No orders of the defined types were placed in this encounter.    Disposition:   Follow up with me or Renee in 6 months   Signed, Chanetta Marshall, NP 09/27/2018 8:17 AM   [facility] [place] [address] [phone] (office) [phone] (fax)

## 2018-09-27 ENCOUNTER — Ambulatory Visit (INDEPENDENT_AMBULATORY_CARE_PROVIDER_SITE_OTHER): Payer: Medicare Other | Admitting: Nurse Practitioner

## 2018-09-27 ENCOUNTER — Other Ambulatory Visit: Payer: Self-pay | Admitting: Nurse Practitioner

## 2018-09-27 VITALS — BP 140/74 | HR 72 | Ht 67.0 in | Wt 173.0 lb

## 2018-09-27 DIAGNOSIS — I480 Paroxysmal atrial fibrillation: Secondary | ICD-10-CM | POA: Diagnosis not present

## 2018-09-27 MED ORDER — DILTIAZEM HCL 30 MG PO TABS: 30.0000 mg | ORAL_TABLET | Freq: Four times a day (QID) | ORAL | 1 refills | Status: DC | PRN

## 2018-09-27 NOTE — Patient Instructions (Signed)
Medication Instructions:  START DILTIAZEM 30 mg EVERY 6 HOURS AS NEEDED  If you need a refill on your cardiac medications before your next appointment, please call your pharmacy.   Lab work: NONE If you have labs (blood work) drawn today and your tests are completely normal, you will receive your results only by: Marland Kitchen MyChart Message (if you have MyChart) OR . A paper copy in the mail If you have any lab test that is abnormal or we need to change your treatment, we will call you to review the results.  Testing/Procedures: NONE  Follow-Up:Amber Seiler 6 months At [facility], you and your health needs are our priority.  As part of our continuing mission to provide you with exceptional heart care, we have created designated Provider Care Teams.  These Care Teams include your primary Cardiologist (physician) and Advanced Practice Providers (APPs -  Physician Assistants and Nurse Practitioners) who all work together to provide you with the care you need, when you need it. .   Any Other Special Instructions Will Be Listed Below (If Applicable).

## 2018-11-01 ENCOUNTER — Telehealth: Payer: Self-pay | Admitting: Family Medicine

## 2018-11-01 NOTE — Telephone Encounter (Signed)
Patient had an ultrasound done on 09/17/18.  Patient's wife said they haven't received the results.  She said they tried to get on MyChart, but was unable to get in. I gave patient's wife the number to MyChart to get it corrected.  Please call her back with results.

## 2018-11-02 NOTE — Telephone Encounter (Signed)
Spoke with pt relaying results per Dr. Darnell Level.  Pt verbalizes understanding and expresses his thanks.

## 2018-11-02 NOTE — Telephone Encounter (Signed)
plz see result note and notify pt/wife. Thanks.

## 2019-04-26 ENCOUNTER — Telehealth: Payer: Self-pay | Admitting: Family Medicine

## 2019-04-26 ENCOUNTER — Other Ambulatory Visit: Payer: Self-pay

## 2019-04-26 DIAGNOSIS — Z20828 Contact with and (suspected) exposure to other viral communicable diseases: Secondary | ICD-10-CM

## 2019-04-26 DIAGNOSIS — Z20822 Contact with and (suspected) exposure to covid-19: Secondary | ICD-10-CM

## 2019-04-26 NOTE — Telephone Encounter (Signed)
Spoke with pt offering condolences from Dr. Darnell Level and myself.  I informed him the test had been ordered.  Says he had it done today.

## 2019-04-26 NOTE — Telephone Encounter (Signed)
My condolences to patient - I have ordered Covid test. plz notify pt.

## 2019-04-26 NOTE — Telephone Encounter (Signed)
Patient's wife called stating that patient mom recently passed from [place].  They had the mom's funeral service over the weekend and patient wanted to be tested as caution.  Advised patient's wife to take patient the the [facility] building at [facility] and times of testing.  I also advised wife that patient needs to quarantine and we will be in contact with the patient once we know the results and advise him what to do next .

## 2019-04-27 LAB — NOVEL CORONAVIRUS, NAA: SARS-CoV-2, NAA: NOT DETECTED

## 2019-05-07 ENCOUNTER — Other Ambulatory Visit (INDEPENDENT_AMBULATORY_CARE_PROVIDER_SITE_OTHER): Payer: Medicare Other

## 2019-05-07 DIAGNOSIS — Z1211 Encounter for screening for malignant neoplasm of colon: Secondary | ICD-10-CM | POA: Diagnosis not present

## 2019-05-07 LAB — FECAL OCCULT BLOOD, GUAIAC: Fecal Occult Blood: NEGATIVE

## 2019-05-07 LAB — FECAL OCCULT BLOOD, IMMUNOCHEMICAL: Fecal Occult Bld: NEGATIVE

## 2019-05-08 ENCOUNTER — Telehealth: Payer: Self-pay

## 2019-05-08 ENCOUNTER — Telehealth: Payer: Self-pay | Admitting: Nurse Practitioner

## 2019-05-08 NOTE — Telephone Encounter (Signed)
Patient's wife called stating Ernest Bell had irregular HR on Sunday, they thought it was his sugar. She states he is still has it, it's off and on, have not checked he HR. She stated by the end of the day his abdomen is swollen and hard.

## 2019-05-08 NOTE — Telephone Encounter (Addendum)
pts wife (DPR signed) said she has spoke with cardiology about irregular heart beat;nausea,heartburn, weakness and card is to call back with appt; see card phone note about recommendations from card. Pt wife was to call Dr Darnell Level about the abd swelling that is hard by end of day. pts wife said abd  swelling is no worse than usual ( has had this abd swelling every day of his life per pts wife) and no abd pain. Pt's wife scheduled in office appt with Dr Darnell Level on 05/09/19 at 11:15. ED precautions given and Mrs Knabe voiced understanding.Pt has no covid symptoms, no travel and no known exposure to + covid.

## 2019-05-08 NOTE — Telephone Encounter (Signed)
I spoke to the patient's wife Photographer) who called because the patient's heartbeat has been irregular lately.  He denies SOB or CP.    He is prescribed Cardizem 30 mg prn for these occurrences.  They will monitor over the next few days.  They have not recorded HR.  He is due a recall at this time and we will arrange.

## 2019-05-09 ENCOUNTER — Encounter: Payer: Self-pay | Admitting: Family Medicine

## 2019-05-09 ENCOUNTER — Other Ambulatory Visit: Payer: Self-pay

## 2019-05-09 ENCOUNTER — Ambulatory Visit (INDEPENDENT_AMBULATORY_CARE_PROVIDER_SITE_OTHER): Payer: Medicare Other | Admitting: Family Medicine

## 2019-05-09 ENCOUNTER — Emergency Department (HOSPITAL_COMMUNITY)
Admission: EM | Admit: 2019-05-09 | Discharge: 2019-05-09 | Disposition: A | Discharge status: Home / Self Care | Payer: Medicare Other | Attending: Emergency Medicine | Admitting: Emergency Medicine

## 2019-05-09 ENCOUNTER — Emergency Department (HOSPITAL_COMMUNITY): Payer: Medicare Other

## 2019-05-09 ENCOUNTER — Encounter (HOSPITAL_COMMUNITY): Payer: Self-pay | Admitting: Emergency Medicine

## 2019-05-09 VITALS — BP 122/80 | HR 172 | Temp 98.7°F | Ht 66.0 in | Wt 172.4 lb

## 2019-05-09 DIAGNOSIS — I311 Chronic constrictive pericarditis: Secondary | ICD-10-CM

## 2019-05-09 DIAGNOSIS — I480 Paroxysmal atrial fibrillation: Secondary | ICD-10-CM | POA: Diagnosis not present

## 2019-05-09 DIAGNOSIS — R000 Tachycardia, unspecified: Secondary | ICD-10-CM | POA: Diagnosis present

## 2019-05-09 DIAGNOSIS — R110 Nausea: Secondary | ICD-10-CM | POA: Diagnosis not present

## 2019-05-09 DIAGNOSIS — I4892 Unspecified atrial flutter: Secondary | ICD-10-CM | POA: Diagnosis not present

## 2019-05-09 DIAGNOSIS — K7581 Nonalcoholic steatohepatitis (NASH): Secondary | ICD-10-CM

## 2019-05-09 DIAGNOSIS — I2510 Atherosclerotic heart disease of native coronary artery without angina pectoris: Secondary | ICD-10-CM | POA: Insufficient documentation

## 2019-05-09 DIAGNOSIS — I509 Heart failure, unspecified: Secondary | ICD-10-CM | POA: Insufficient documentation

## 2019-05-09 DIAGNOSIS — R61 Generalized hyperhidrosis: Secondary | ICD-10-CM | POA: Insufficient documentation

## 2019-05-09 DIAGNOSIS — K7460 Unspecified cirrhosis of liver: Secondary | ICD-10-CM

## 2019-05-09 LAB — COMPREHENSIVE METABOLIC PANEL
ALT: 35 U/L (ref 0–44)
AST: 25 U/L (ref 15–41)
Albumin: 4.1 g/dL (ref 3.5–5.0)
Alkaline Phosphatase: 51 U/L (ref 38–126)
Anion gap: 10 (ref 5–15)
BUN: 20 mg/dL (ref 8–23)
CO2: 19 mmol/L — ABNORMAL LOW (ref 22–32)
Calcium: 9.1 mg/dL (ref 8.9–10.3)
Chloride: 112 mmol/L — ABNORMAL HIGH (ref 98–111)
Creatinine, Ser: 0.94 mg/dL (ref 0.61–1.24)
GFR calc Af Amer: >60 mL/min (ref >60)
GFR calc non Af Amer: >60 mL/min (ref >60)
Glucose, Bld: 118 mg/dL — ABNORMAL HIGH (ref 70–99)
Potassium: 4 mmol/L (ref 3.5–5.1)
Sodium: 141 mmol/L (ref 135–145)
Total Bilirubin: 2.4 mg/dL — ABNORMAL HIGH (ref 0.3–1.2)
Total Protein: 6.4 g/dL — ABNORMAL LOW (ref 6.5–8.1)

## 2019-05-09 LAB — CBC
HCT: 43.4 % (ref 39.0–52.0)
Hemoglobin: 15.4 g/dL (ref 13.0–17.0)
MCH: 31.8 pg (ref 26.0–34.0)
MCHC: 35.5 g/dL (ref 30.0–36.0)
MCV: 89.5 fL (ref 80.0–100.0)
Platelets: 235 10*3/uL (ref 150–400)
RBC: 4.85 MIL/uL (ref 4.22–5.81)
RDW: 12.5 % (ref 11.5–15.5)
WBC: 11 10*3/uL — ABNORMAL HIGH (ref 4.0–10.5)
nRBC: 0 % (ref 0.0–0.2)

## 2019-05-09 LAB — MAGNESIUM: Magnesium: 2.2 mg/dL (ref 1.7–2.4)

## 2019-05-09 MED ORDER — DILTIAZEM HCL ER COATED BEADS 120 MG PO CP24: 120.0000 mg | ORAL_CAPSULE | Freq: Every day | ORAL | 0 refills | Status: DC

## 2019-05-09 MED ORDER — SODIUM CHLORIDE 0.9 % IV BOLUS
1000.0000 mL | Freq: Once | INTRAVENOUS | Status: AC
  Administered 2019-05-09: 1000 mL via INTRAVENOUS

## 2019-05-09 MED ORDER — APIXABAN 5 MG PO TABS
5.0000 mg | ORAL_TABLET | Freq: Once | ORAL | Status: AC
  Administered 2019-05-09: 5 mg via ORAL
  Filled 2019-05-09: qty 1

## 2019-05-09 MED ORDER — APIXABAN 5 MG PO TABS: 5.0000 mg | ORAL_TABLET | Freq: Two times a day (BID) | ORAL | 1 refills | Status: DC

## 2019-05-09 MED ORDER — DILTIAZEM HCL 25 MG/5ML IV SOLN
20.0000 mg | Freq: Once | INTRAVENOUS | Status: AC
  Administered 2019-05-09: 20 mg via INTRAVENOUS
  Filled 2019-05-09: qty 5

## 2019-05-09 NOTE — ED Notes (Signed)
Patient verbalizes understanding of discharge instructions. Opportunity for questioning and answers were provided. Armband removed by staff, pt discharged from ED ambulatory.   

## 2019-05-09 NOTE — ED Provider Notes (Addendum)
[facility] Emergency Department Provider Note MRN:  [id number]  Arrival date & time: 05/09/19     Chief Complaint   Palpitations History of Present Illness   Ernest Bell is a 73 y.o. year-old male with a history of A. fib, CHF, CAD presenting to the ED with chief complaint of palpitations.  Sudden onset palpitations 4 days ago accompanied by nausea and diaphoresis.  Symptoms of palpitations have been persistent since that time, continued mild nausea but otherwise feeling well.  Still working during this time, played tennis yesterday for 3 hours.  Still present today, here for evaluation.  Denies fever, no cough, no numbness or weakness to the arms or legs, no chest pain, no shortness of breath, no abdominal pain.  Does not take blood thinners.  Is prescribed diltiazem for this purpose but ran out months ago and has not taken any.  EMS reports 3 attempts of adenosine without response.  Review of Systems  A complete 10 system review of systems was obtained and all systems are negative except as noted in the HPI and PMH.   Patient's Health History    Past Medical History:  Diagnosis Date  . Atrial fib/flutter, transient 04/28/10   typical appearing atrial flutter dx 04/28/10 s/p CTI ablation 10/11  . BPH (benign prostatic hyperplasia)    PSA stable, followed by Dr. Eliberto Ivory  . CHF (congestive heart failure) ([place])   . 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 Coronary artery disease   . Depression    on sertraline  . DJD (degenerative joint disease)   . Gout   . Liver cirrhosis secondary to NASH (nonalcoholic steatohepatitis) ([place]) 2001   ?by liver biopsy  . Persistent atrial fibrillation 09/15/10   S/P PVI (Cryo) at [facility]  . Syncope 1997   recurrent S/P tilt study by Dr. [name] (like due to dysautonomia)    Past Surgical History:  Procedure Laterality Date  . afib abation  09/15/10   Cryo at [place], CTI ablation at [facility]  04/28/10  . CHOLECYSTECTOMY  1995  . COLONOSCOPY  02/2000   2 hyperplastic polyps, [name]  . ESOPHAGOGASTRODUODENOSCOPY  02/2000   [name]  . HEMORROIDECTOMY  2007  . KNEE SURGERY  2007   he had osteoarthritis with left knee surgery  . PERICARDIECTOMY  1969   at [facility]  . right thumb surger     for osteoarthritis    Family History  Problem Relation Age of Onset  . Hypertension Mother   . Alcohol abuse Father        smoker  . Heart disease Father        MI (smoker)    Social History   Socioeconomic History  . Marital status: Married    Spouse name: Not on file  . Number of children: Not on file  . Years of education: Not on file  . Highest education level: Not on file  Occupational History  . 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 Financial resource strain: Not on file  . Food insecurity    Worry: Not on file    Inability: Not on file  . Transportation needs    Medical: Not on file    Non-medical: Not on file  Tobacco Use  . Smoking status: Never Smoker  . Smokeless tobacco: Never Used  Substance and  Sexual Activity  . Alcohol use: No  . Drug use: No  . Sexual activity: Yes  Lifestyle  . Physical activity    Days per week: Not on file    Minutes per session: Not on file  . Stress: Not on file  Relationships  . Social Herbalist on phone: Not on file    Gets together: Not on file    Attends religious service: Not on file    Active member of club or organization: Not on file    Attends meetings of clubs or organizations: Not on file    Relationship status: Not on file  . Intimate partner violence    Fear of current or ex partner: Not on file    Emotionally abused: Not on file    Physically abused: Not on file    Forced sexual activity: Not on file  Other Topics Concern  . 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   Diet: good water, fruits/vegetables daily     Physical Exam  Vital Signs and Nursing Notes reviewed Vitals:   05/09/19 1400 05/09/19 1439  BP:    Pulse: 83 82  Resp: 18 (!) 22  SpO2: 94% 96%    CONSTITUTIONAL: Well-appearing, NAD NEURO:  Alert and oriented x 3, no focal deficits EYES:  eyes equal and reactive ENT/NECK:  no LAD, no JVD CARDIO: Tachycardic rate, well-perfused, normal S1 and S2 PULM:  CTAB no wheezing or rhonchi GI/GU:  normal bowel sounds, non-distended, non-tender MSK/SPINE:  No gross deformities, no edema SKIN:  no rash, atraumatic PSYCH:  Appropriate speech and behavior  Diagnostic and Interventional Summary    EKG Interpretation  Date/Time:  Thursday May 09 2019 13:17:46 EDT Ventricular Rate:  162 PR Interval:    QRS Duration: 82 QT Interval:  303 QTC Calculation: 498 R Axis:   54 Text Interpretation:  Atrial fibrillation with rapid V-rate Confirmed by Gerlene Fee [phone]) on 05/09/2019 1:30:50 PM       EKG Interpretation  Date/Time:  Thursday May 09 2019 14:01:34 EDT Ventricular Rate:  83 PR Interval:    QRS Duration: 107 QT Interval:  378 QTC Calculation: 445 R Axis:   52 Text Interpretation:  Atrial flutter with predominant 4:1 AV block ST elevation, consider inferior injury Confirmed by Gerlene Fee [phone]) on 05/09/2019 2:14:16 PM       Labs Reviewed  CBC - Abnormal; Notable for the following components:      Result Value   WBC 11.0 (*)    All other components within normal limits  COMPREHENSIVE METABOLIC PANEL - Abnormal; Notable for the following components:   Chloride 112 (*)    CO2 19 (*)    Glucose, Bld 118 (*)    Total Protein 6.4 (*)    Total Bilirubin 2.4 (*)    All other components within normal limits  MAGNESIUM    DG Chest  Port 1 View  Final Result      Medications  apixaban (ELIQUIS) tablet 5 mg (has no administration in time range)  sodium chloride 0.9 % bolus 1,000 mL (0 mLs Intravenous Stopped 05/09/19 1439)  diltiazem (CARDIZEM) injection 20 mg (20 mg Intravenous Given 05/09/19 1353)     Procedures Critical Care Critical Care Documentation Critical  care time provided by me (excluding procedures): 32 minutes  Condition necessitating critical care: Atrial flutter with rapid ventricular response  Components of critical care management: reviewing of prior records, laboratory and imaging interpretation, frequent re-examination and reassessment of vital signs, administration of IV diltiazem, discussion with consulting services.    ED Course and Medical Decision Making  I have reviewed the triage vital signs and the nursing notes.  Pertinent labs & imaging results that were available during my care of the patient were reviewed by me and considered in my medical decision making (see below for details).  Atrial fibrillation with rapid ventricular response in this 73 year old male with history of A. fib.  Patient is not anticoagulated, likely due to his low chads vasc score.  His symptoms have been present for 4 days, making him a poor cardioversion candidate.  Rates are in the 170s to 150s, did not convert with adenosine provided by EMS.  We will provide 1 L normal saline, 20 mg diltiazem and reassess.  If continued RVR, will admit for rate control.  Patient is without chest pain or shortness of breath, nothing to suggest ACS or pulmonary embolism.  Dehydration possibly contributing, will check electrolytes.  Clinical Course as of May 08 1524  Thu May 09, 2019  1419 Patient's rate is much improved after 20 of diltiazem.  In the 80s, repeat EKG revealing 4-1 atrial flutter.  Given that he is rate controlled, he is a better candidate for discharge.  Case discussed with Dr. Nechama Guard of cardiology.  It appears that  patient's initial EKG is actually more consistent with 2-1 atrial flutter and he has converted to a 4-1 flutter.  Cardiology recommendations include 120 mg of diltiazem long-acting daily, starting on Eliquis so that he can be anticoagulated for likely outpatient cardioversion.  To follow-up closely with A. fib clinic.  Still awaiting laboratory assessment.   [MB]    Clinical Course User Index [MB] Maudie Flakes, MD    Labs are reassuring, patient's heart rate increasing slightly into the 90s, low 100s but patient is asymptomatic, will start taking a daily diltiazem for rate control, will start Eliquis, patient was counseled by pharmacy regarding this new medication, will follow-up in A. fib clinic early next week.  Strict return precautions for worsening palpitations, chest pain, shortness of breath.  Chads vas score calculated at 1 based on patient's age, no other significant risk factors.  Started on Eliquis mostly to prepare patient for likely outpatient cardioversion with cardiology.   Barth Kirks. Sedonia Small, [place] [email or web address]  Final Clinical Impressions(s) / ED Diagnoses     ICD-10-CM   1. Atrial flutter with rapid ventricular response (HCC)  I48.92   2. Tachycardia  R00.0 DG Chest Port 1 View    DG Chest Port 1 View    ED Discharge Orders         Ordered    Amb referral to AFIB Clinic     05/09/19 1507    diltiazem (CARDIZEM CD) 120 MG 24 hr capsule  Daily     05/09/19 1523    apixaban (ELIQUIS) 5 MG TABS tablet  2 times daily     05/09/19 1523          Discharge Instructions Discussed with and Provided to Patient:   Discharge Instructions     You were evaluated in the Emergency Department and after careful evaluation, we did not find any emergent condition requiring admission or  further testing in the hospital.  Your symptoms seem to be related to atrial flutter, and abnormal heart rhythm.  We were able to  control your heart rate with medications here in the emergency department.  We discussed your case with cardiologist, who would like to see you closely in the A. fib clinic Monday at 9:30 AM.  We are recommending that you start taking 2 new medications, the diltiazem daily 120 mg tablet and the twice a day Eliquis.  Please return to the Emergency Department if you experience any worsening of your condition.  We encourage you to follow up with a primary care provider.  Thank you for allowing [place] to be a part of your care.   Information on my medicine - ELIQUIS (apixaban)  This medication education was reviewed with me or my healthcare representative as part of my discharge preparation.    WHY WAS ELIQUIS PRESCRIBED FOR YOU? Eliquis was prescribed for you to reduce the risk of a blood clot forming that can cause a stroke if you have a medical condition called atrial fibrillation (a type of irregular heartbeat).  WHAT DO YOU NEED TO KNOW ABOUT ELIQUIS ? Take your Eliquis TWICE DAILY - one tablet in the morning and one tablet in the evening with or without food. If you have difficulty swallowing the tablet whole please discuss with your pharmacist how to take the medication safely.  Take Eliquis exactly as prescribed by your doctor and DO NOT stop taking Eliquis without talking to the doctor who prescribed the medication.  Stopping may increase your risk of developing a stroke.  Refill your prescription before you run out.  After discharge, you should have regular check-up appointments with your healthcare provider that is prescribing your Eliquis.  In the future your dose may need to be changed if your kidney function or weight changes by a significant amount or as you get older.  WHAT DO YOU DO IF YOU MISS A DOSE? If you miss a dose, take it as soon as you remember on the same day and resume taking twice daily.  Do not take more than one dose of ELIQUIS at the same time to make up a missed  dose.  IMPORTANT SAFETY INFORMATION A possible side effect of Eliquis is bleeding. You should call your healthcare provider right away if you experience any of the following: ? Bleeding from an injury or your nose that does not stop. ? Unusual colored urine (red or dark brown) or unusual colored stools (red or black). ? Unusual bruising for unknown reasons. ? A serious fall or if you hit your head (even if there is no bleeding).  Some medicines may interact with Eliquis and might increase your risk of bleeding or clotting while on Eliquis. To help avoid this, consult your healthcare provider or pharmacist prior to using any new prescription or non-prescription medications, including herbals, vitamins, non-steroidal anti-inflammatory drugs (NSAIDs) and supplements.  This website has more information on Eliquis (apixaban): http://www.eliquis.com/eliquis/home       Maudie Flakes, MD 05/09/19 1526    Maudie Flakes, MD 05/09/19 [phone]

## 2019-05-09 NOTE — Progress Notes (Addendum)
This visit was conducted in person.  BP 122/80 (BP Location: Left Arm, Patient Position: Sitting, Cuff Size: Normal)   Pulse (!) 172   Temp 98.7 F (37.1 C) (Temporal)   Ht 5\' 6"  (1.676 m)   Wt 172 lb 7 oz (78.2 kg)   SpO2 97%   BMI 27.83 kg/m    CC: tachypalpitations Subjective:    Patient ID: Ernest Bell, male    DOB: May 04, 1946, 73 y.o.   MRN: [id number]  HPI: LIAMJAMES LEADBEATER is a 73 y.o. male presenting on 05/09/2019 for Irregular Heart Beat (C/o irregular heart beat, cold sweats and extra heartbeat.  Denies any chest pains. Started 05/05/19.   H/o irregular heart beat.  Seems to normalize when pt is active. Pt accompanied by wife, Patsy. )   Sunday afternoon had episode of cold sweat, lightheaded, nausea, extra heartbeats. He took diltiazem (uses PRN palpitations) with benefit, then that evening went to play tennis. Has had intermittent episodes of "uneasy heart" the rest of this week but denies feeling palpitations or racing heart. Endorses chronic abd bloating in h/o cirrhosis.  Called cardiology who recommended restarting dilt 30mg  Q3 hours prn palpitations.   Currently feels well, never had chest pain, dyspnea, presyncope/syncope, HA.   Mother did pass away 2 wks ago of covid.   Known h/o afib/aflutter s/p 2 ablations with benefit (2011), h/o CHF and constrictive pericarditis s/p pericardectomy (1969).      Relevant past medical, surgical, family and social history reviewed and updated as indicated. Interim medical history since our last visit reviewed. Allergies and medications reviewed and updated. Outpatient Medications Prior to Visit  Medication Sig Dispense Refill  . diltiazem (CARDIZEM) 30 MG tablet Take 1 tablet (30 mg total) by mouth every 6 (six) hours as needed. 30 tablet 1  . ibuprofen (ADVIL,MOTRIN) 800 MG tablet Take 1 tablet by mouth every 8 (eight) hours as needed for pain.  1  . Tamsulosin HCl (FLOMAX) 0.4 MG CAPS Take 0.4 mg by mouth 2 (two) times  daily.      No facility-administered medications prior to visit.      Per HPI unless specifically indicated in ROS section below Review of Systems Objective:    BP 122/80 (BP Location: Left Arm, Patient Position: Sitting, Cuff Size: Normal)   Pulse (!) 172   Temp 98.7 F (37.1 C) (Temporal)   Ht 5\' 6"  (1.676 m)   Wt 172 lb 7 oz (78.2 kg)   SpO2 97%   BMI 27.83 kg/m   Wt Readings from Last 3 Encounters:  05/09/19 172 lb 7 oz (78.2 kg)  09/27/18 173 lb (78.5 kg)  08/21/18 171 lb 8 oz (77.8 kg)    Physical Exam Vitals signs and nursing note reviewed.  Constitutional:      General: He is not in acute distress.    Appearance: Normal appearance. He is not ill-appearing.  HENT:     Mouth/Throat:     Mouth: Mucous membranes are moist.     Pharynx: No posterior oropharyngeal erythema.  Eyes:     General: No scleral icterus.    Extraocular Movements: Extraocular movements intact.     Pupils: Pupils are equal, round, and reactive to light.  Neck:     Thyroid: No thyromegaly.  Cardiovascular:     Rate and Rhythm: Regular rhythm. Tachycardia present.     Heart sounds: No murmur.     Comments: Tachycardia to 170s Pulmonary:     Effort: Pulmonary effort  is normal. No respiratory distress.     Breath sounds: Normal breath sounds. No wheezing, rhonchi or rales.  Abdominal:     General: Abdomen is flat. Bowel sounds are normal. There is distension (endorses chronic).     Palpations: Abdomen is soft. There is no hepatomegaly, splenomegaly or mass.     Tenderness: There is no abdominal tenderness. There is no guarding or rebound.     Hernia: No hernia is present.  Neurological:     Mental Status: He is alert.       Results for orders placed or performed in visit on 05/07/19  Fecal occult blood, imunochemical   Specimen: Blood  Result Value Ref Range   Fecal Occult Bld Negative Negative   EKG - regular narrow complex tachycardia rate 170s Assessment & Plan:   Problem List  Items Addressed This Visit    Tachycardia - Primary    Marked tachycardia to 170s although seems to be tolerating ok. May be having intermittent tachycardia of unsure duration as he does not seem to be aware of this. Discussed dangers of untreateed tachycardia - recommended ER evaluation today - will be sent via EMS. Pt and wife agree with plan.       Relevant Orders   EKG 12-Lead (Completed)   Liver cirrhosis secondary to NASH (nonalcoholic steatohepatitis) ([place])   Constrictive pericarditis   Atrial fibrillation ([place])    H/o this. Will send to ER.           No orders of the defined types were placed in this encounter.  Orders Placed This Encounter  Procedures  . EKG 12-Lead    Follow up plan: No follow-ups on file.  Ria Bush, MD

## 2019-05-09 NOTE — Discharge Instructions (Addendum)
You were evaluated in the Emergency Department and after careful evaluation, we did not find any emergent condition requiring admission or further testing in the hospital.  Your symptoms seem to be related to atrial flutter, and abnormal heart rhythm.  We were able to control your heart rate with medications here in the emergency department.  We discussed your case with cardiologist, who would like to see you closely in the A. fib clinic Monday at 9:30 AM.  We are recommending that you start taking 2 new medications, the diltiazem daily 120 mg tablet and the twice a day Eliquis.  Please return to the Emergency Department if you experience any worsening of your condition.  We encourage you to follow up with a primary care provider.  Thank you for allowing [place] to be a part of your care.   Information on my medicine - ELIQUIS (apixaban)  This medication education was reviewed with me or my healthcare representative as part of my discharge preparation.    WHY WAS ELIQUIS PRESCRIBED FOR YOU? Eliquis was prescribed for you to reduce the risk of a blood clot forming that can cause a stroke if you have a medical condition called atrial fibrillation (a type of irregular heartbeat).  WHAT DO YOU NEED TO KNOW ABOUT ELIQUIS ? Take your Eliquis TWICE DAILY - one tablet in the morning and one tablet in the evening with or without food. If you have difficulty swallowing the tablet whole please discuss with your pharmacist how to take the medication safely.  Take Eliquis exactly as prescribed by your doctor and DO NOT stop taking Eliquis without talking to the doctor who prescribed the medication.  Stopping may increase your risk of developing a stroke.  Refill your prescription before you run out.  After discharge, you should have regular check-up appointments with your healthcare provider that is prescribing your Eliquis.  In the future your dose may need to be changed if your kidney function or weight  changes by a significant amount or as you get older.  WHAT DO YOU DO IF YOU MISS A DOSE? If you miss a dose, take it as soon as you remember on the same day and resume taking twice daily.  Do not take more than one dose of ELIQUIS at the same time to make up a missed dose.  IMPORTANT SAFETY INFORMATION A possible side effect of Eliquis is bleeding. You should call your healthcare provider right away if you experience any of the following: Bleeding from an injury or your nose that does not stop. Unusual colored urine (red or dark brown) or unusual colored stools (red or black). Unusual bruising for unknown reasons. A serious fall or if you hit your head (even if there is no bleeding).  Some medicines may interact with Eliquis and might increase your risk of bleeding or clotting while on Eliquis. To help avoid this, consult your healthcare provider or pharmacist prior to using any new prescription or non-prescription medications, including herbals, vitamins, non-steroidal anti-inflammatory drugs (NSAIDs) and supplements.  This website has more information on Eliquis (apixaban): http://www.eliquis.com/eliquis/home

## 2019-05-09 NOTE — Assessment & Plan Note (Addendum)
Marked tachycardia to 170s although seems to be tolerating ok. May be having intermittent tachycardia of unsure duration as he does not seem to be aware of this. Discussed dangers of untreateed tachycardia - recommended ER evaluation today - will be sent via EMS. Pt and wife agree with plan.

## 2019-05-09 NOTE — Patient Instructions (Signed)
To ER today for further evaluation and treatment of tachycardia/racing heart.

## 2019-05-09 NOTE — Addendum Note (Signed)
Addended by: Brenton Grills on: [id number] AM   Modules accepted: Orders

## 2019-05-09 NOTE — Assessment & Plan Note (Signed)
H/o this. Will send to ER.

## 2019-05-09 NOTE — Telephone Encounter (Signed)
Seen today. Sent to ER.

## 2019-05-20 ENCOUNTER — Other Ambulatory Visit: Payer: Self-pay

## 2019-05-20 ENCOUNTER — Encounter (HOSPITAL_COMMUNITY): Payer: Self-pay | Admitting: Nurse Practitioner

## 2019-05-20 ENCOUNTER — Ambulatory Visit (HOSPITAL_COMMUNITY)
Admission: RE | Admit: 2019-05-20 | Discharge: 2019-05-20 | Disposition: A | Discharge status: Home / Self Care | Payer: Medicare Other | Source: Ambulatory Visit | Attending: Nurse Practitioner | Admitting: Nurse Practitioner

## 2019-05-20 VITALS — BP 150/78 | HR 74 | Ht 66.0 in | Wt 168.8 lb

## 2019-05-20 DIAGNOSIS — N400 Enlarged prostate without lower urinary tract symptoms: Secondary | ICD-10-CM | POA: Insufficient documentation

## 2019-05-20 DIAGNOSIS — I480 Paroxysmal atrial fibrillation: Secondary | ICD-10-CM

## 2019-05-20 DIAGNOSIS — Z79899 Other long term (current) drug therapy: Secondary | ICD-10-CM | POA: Insufficient documentation

## 2019-05-20 DIAGNOSIS — I509 Heart failure, unspecified: Secondary | ICD-10-CM | POA: Diagnosis not present

## 2019-05-20 DIAGNOSIS — Z7901 Long term (current) use of anticoagulants: Secondary | ICD-10-CM | POA: Insufficient documentation

## 2019-05-20 DIAGNOSIS — I2510 Atherosclerotic heart disease of native coronary artery without angina pectoris: Secondary | ICD-10-CM | POA: Insufficient documentation

## 2019-05-20 DIAGNOSIS — K7460 Unspecified cirrhosis of liver: Secondary | ICD-10-CM | POA: Insufficient documentation

## 2019-05-20 DIAGNOSIS — K7581 Nonalcoholic steatohepatitis (NASH): Secondary | ICD-10-CM | POA: Insufficient documentation

## 2019-05-20 DIAGNOSIS — I4892 Unspecified atrial flutter: Secondary | ICD-10-CM | POA: Diagnosis not present

## 2019-05-20 DIAGNOSIS — Z8249 Family history of ischemic heart disease and other diseases of the circulatory system: Secondary | ICD-10-CM | POA: Insufficient documentation

## 2019-05-20 MED ORDER — DILTIAZEM HCL ER COATED BEADS 120 MG PO CP24: 120.0000 mg | ORAL_CAPSULE | Freq: Every day | ORAL | 6 refills | Status: DC

## 2019-05-20 MED ORDER — DILTIAZEM HCL 30 MG PO TABS: 30.0000 mg | ORAL_TABLET | Freq: Four times a day (QID) | ORAL | 1 refills | Status: DC | PRN

## 2019-05-20 MED ORDER — APIXABAN 5 MG PO TABS: 5.0000 mg | ORAL_TABLET | Freq: Two times a day (BID) | ORAL | 6 refills | Status: DC

## 2019-05-20 NOTE — Progress Notes (Signed)
Primary Care Physician: Ria Bush, MD Referring Physician: [facility] ER f/u   Ernest Bell is a 73 y.o. male with a h/ restrictive pericarditis s/p pericardectomy in 1969, NASH/cirrhosis, AFib/flutter (hx reports typical flutter ablation 04/28/10, and PVI cryoablation Oct. 2011.Fleainide was stopped since 2017. No anticoagulation since 2017 as well for a chadsvasc score of 1.  He presented to [facility] ER for an elevated HR 9/3, with EKG showing rapid afib vrs an atypcial flutter. He had been aware of symptoms x 4 days. He had 3 doses of adenosine per EMS without response. He did convert with cardizem drip and was discharged on po cardizem as well as xarelto 20 mg daily. In the afib clinic,he reports that he has not had any more issues. Triggers discussed and none identified. He played tennis Friday without any issues.  Today, he denies symptoms of palpitations, chest pain, shortness of breath, orthopnea, PND, lower extremity edema, dizziness, presyncope, syncope, or neurologic sequela. The patient is tolerating medications without difficulties and is otherwise without complaint today.   Past Medical History:  Diagnosis Date  . Atrial fib/flutter, transient 04/28/10   typical appearing atrial flutter dx 04/28/10 s/p CTI ablation 10/11  . BPH (benign prostatic hyperplasia)    PSA stable, followed by Dr. Eliberto Ivory  . CHF (congestive heart failure) ([place])   . 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 Coronary artery disease   . Depression    on sertraline  . DJD (degenerative joint disease)   . Gout   . Liver cirrhosis secondary to NASH (nonalcoholic steatohepatitis) ([place]) 2001   ?by liver biopsy  . Persistent atrial fibrillation 09/15/10   S/P PVI (Cryo) at [facility]  . Syncope 1997   recurrent S/P tilt study by Dr. [name] (like due to dysautonomia)   Past Surgical History:  Procedure Laterality Date  . afib abation  09/15/10   Cryo at [place], CTI ablation at [facility]  04/28/10  . CHOLECYSTECTOMY  1995  . COLONOSCOPY  02/2000   2 hyperplastic polyps, [name]  . ESOPHAGOGASTRODUODENOSCOPY  02/2000   [name]  . HEMORROIDECTOMY  2007  . KNEE SURGERY  2007   he had osteoarthritis with left knee surgery  . PERICARDIECTOMY  1969   at [facility]  . right thumb surger     for osteoarthritis    Current Outpatient Medications  Medication Sig Dispense Refill  . apixaban (ELIQUIS) 5 MG TABS tablet Take 1 tablet (5 mg total) by mouth 2 (two) times daily. 30 tablet 6  . diltiazem (CARDIZEM CD) 120 MG 24 hr capsule Take 1 capsule (120 mg total) by mouth daily. 30 capsule 6  . diltiazem (CARDIZEM) 30 MG tablet Take 1 tablet (30 mg total) by mouth every 6 (six) hours as needed. 30 tablet 1  . ibuprofen (ADVIL,MOTRIN) 800 MG tablet Take 1 tablet by mouth every 8 (eight) hours as needed for pain.  1  . Tamsulosin HCl (FLOMAX) 0.4 MG CAPS Take 0.4 mg by mouth 2 (two) times daily.      No current facility-administered medications for this encounter.     Allergies  Allergen Reactions  . Penicillins Rash    Social History   Socioeconomic History  . Marital status: Married    Spouse name: Not on file  . Number of children: Not on file  . Years of education: Not on file  . Highest education level: Not on file  Occupational History  . 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 Financial resource strain: Not on file  . Food insecurity    Worry: Not on file    Inability: Not on file  . Transportation needs    Medical: Not on file    Non-medical: Not on file  Tobacco Use  . Smoking status: Never Smoker  . Smokeless tobacco: Never Used  Substance and Sexual Activity  . Alcohol use: No  . Drug use: No  . Sexual activity: Yes  Lifestyle  . Physical activity    Days per week: Not on file    Minutes per session: Not on file  . Stress: Not on file   Relationships  . Social Herbalist on phone: Not on file    Gets together: Not on file    Attends religious service: Not on file    Active member of club or organization: Not on file    Attends meetings of clubs or organizations: Not on file    Relationship status: Not on file  . Intimate partner violence    Fear of current or ex partner: Not on file    Emotionally abused: Not on file    Physically abused: Not on file    Forced sexual activity: Not on file  Other Topics Concern  . 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   Diet: good water, fruits/vegetables daily    Family History  Problem Relation Age of Onset  . Hypertension Mother   . Alcohol abuse Father        smoker  . Heart disease Father        MI (smoker)    ROS- All systems are reviewed and negative except as per the HPI above  Physical Exam: Vitals:   05/20/19 0951  BP: (!) 150/78  Pulse: 74  Weight: 76.6 kg  Height: 5\' 6"  (1.676 m)   Wt Readings from Last 3 Encounters:  05/20/19 76.6 kg  05/09/19 78.2 kg  09/27/18 78.5 kg    Labs: Lab Results  Component Value Date   NA 141 05/09/2019   K 4.0 05/09/2019   CL 112 (H) 05/09/2019   CO2 19 (L) 05/09/2019   GLUCOSE 118 (H) 05/09/2019   BUN 20 05/09/2019   CREATININE 0.94 05/09/2019   CALCIUM 9.1 05/09/2019   MG 2.2 05/09/2019   Lab Results  Component Value Date   INR 1.2 (H) 04/15/2016   Lab Results  Component Value Date   CHOL 164 07/16/2018   HDL 43.80 07/16/2018   LDLCALC 99 07/16/2018   TRIG 110.0 07/16/2018     GEN- The patient is well appearing, alert and oriented x 3 today.   Head- normocephalic, atraumatic Eyes-  Sclera clear, conjunctiva pink Ears- hearing intact Oropharynx- clear Neck- supple, no JVP Lymph- no cervical  lymphadenopathy Lungs- Clear to ausculation bilaterally, normal work of breathing Heart- Regular rate and rhythm, no murmurs, rubs or gallops, PMI not laterally displaced GI- soft, NT, ND, + BS Extremities- no clubbing, cyanosis, or edema MS- no significant deformity or atrophy Skin- no rash or lesion Psych- euthymic mood, full affect Neuro- strength and sensation are intact  EKG-Sinus rhythm with  PAC's at 74 bpm, qrs int 80 ms, qtc 472 ms Epic records reviewed    Assessment and Plan: 1. Paroxysmal afib/flutter  Has been very quiet until recently  Prior treatment with flecainide  Sudden onset without any identifiable triggers  Continue diltiazem 120 mg daily, refills sent in on 30 mg as needed Echo to be updated   2. CHA2DS2VASc score of 1  Continue eliquis 5 mg bid that was restarted in the ED Will see back in 3-4 weeks for review of echo and to review risk factors to see if anticoagulation needs to be continued   Butch Penny C. Cyril Woodmansee, [facility] [address] [place], [place] [address] [phone]

## 2019-05-28 ENCOUNTER — Ambulatory Visit (HOSPITAL_COMMUNITY)
Admission: RE | Admit: 2019-05-28 | Discharge: 2019-05-28 | Disposition: A | Discharge status: Home / Self Care | Payer: Medicare Other | Source: Ambulatory Visit | Attending: Family Medicine | Admitting: Family Medicine

## 2019-05-28 ENCOUNTER — Other Ambulatory Visit: Payer: Self-pay

## 2019-05-28 DIAGNOSIS — E785 Hyperlipidemia, unspecified: Secondary | ICD-10-CM | POA: Insufficient documentation

## 2019-05-28 DIAGNOSIS — I083 Combined rheumatic disorders of mitral, aortic and tricuspid valves: Secondary | ICD-10-CM | POA: Diagnosis not present

## 2019-05-28 DIAGNOSIS — I480 Paroxysmal atrial fibrillation: Secondary | ICD-10-CM

## 2019-05-28 DIAGNOSIS — I4891 Unspecified atrial fibrillation: Secondary | ICD-10-CM | POA: Insufficient documentation

## 2019-05-28 DIAGNOSIS — G4733 Obstructive sleep apnea (adult) (pediatric): Secondary | ICD-10-CM | POA: Diagnosis not present

## 2019-05-28 DIAGNOSIS — I319 Disease of pericardium, unspecified: Secondary | ICD-10-CM | POA: Insufficient documentation

## 2019-05-28 NOTE — Progress Notes (Signed)
  Echocardiogram 2D Echocardiogram has been performed.  Elisea Khader L Androw 05/28/2019, 8:53 AM

## 2019-05-29 ENCOUNTER — Encounter (HOSPITAL_COMMUNITY): Payer: Self-pay | Admitting: *Deleted

## 2019-05-31 ENCOUNTER — Other Ambulatory Visit (HOSPITAL_COMMUNITY): Payer: Self-pay | Admitting: *Deleted

## 2019-05-31 MED ORDER — DILTIAZEM HCL 30 MG PO TABS: 30.0000 mg | ORAL_TABLET | Freq: Four times a day (QID) | ORAL | 1 refills | Status: DC | PRN

## 2019-06-19 ENCOUNTER — Encounter (HOSPITAL_COMMUNITY): Payer: Self-pay | Admitting: Nurse Practitioner

## 2019-06-19 ENCOUNTER — Ambulatory Visit (HOSPITAL_COMMUNITY)
Admission: RE | Admit: 2019-06-19 | Discharge: 2019-06-19 | Disposition: A | Discharge status: Home / Self Care | Payer: Medicare Other | Source: Ambulatory Visit | Attending: Nurse Practitioner | Admitting: Nurse Practitioner

## 2019-06-19 ENCOUNTER — Other Ambulatory Visit: Payer: Self-pay

## 2019-06-19 VITALS — BP 150/70 | HR 72 | Ht 66.0 in | Wt 172.6 lb

## 2019-06-19 DIAGNOSIS — I491 Atrial premature depolarization: Secondary | ICD-10-CM | POA: Insufficient documentation

## 2019-06-19 DIAGNOSIS — I509 Heart failure, unspecified: Secondary | ICD-10-CM | POA: Diagnosis not present

## 2019-06-19 DIAGNOSIS — Z79899 Other long term (current) drug therapy: Secondary | ICD-10-CM | POA: Diagnosis not present

## 2019-06-19 DIAGNOSIS — I4892 Unspecified atrial flutter: Secondary | ICD-10-CM | POA: Insufficient documentation

## 2019-06-19 DIAGNOSIS — K7581 Nonalcoholic steatohepatitis (NASH): Secondary | ICD-10-CM | POA: Diagnosis not present

## 2019-06-19 DIAGNOSIS — F329 Major depressive disorder, single episode, unspecified: Secondary | ICD-10-CM | POA: Insufficient documentation

## 2019-06-19 DIAGNOSIS — M109 Gout, unspecified: Secondary | ICD-10-CM | POA: Insufficient documentation

## 2019-06-19 DIAGNOSIS — I2510 Atherosclerotic heart disease of native coronary artery without angina pectoris: Secondary | ICD-10-CM | POA: Insufficient documentation

## 2019-06-19 DIAGNOSIS — I480 Paroxysmal atrial fibrillation: Secondary | ICD-10-CM | POA: Diagnosis present

## 2019-06-19 DIAGNOSIS — K7460 Unspecified cirrhosis of liver: Secondary | ICD-10-CM | POA: Diagnosis not present

## 2019-06-19 DIAGNOSIS — N400 Enlarged prostate without lower urinary tract symptoms: Secondary | ICD-10-CM | POA: Diagnosis not present

## 2019-06-19 MED ORDER — DILTIAZEM HCL ER COATED BEADS 120 MG PO CP24: 120.0000 mg | ORAL_CAPSULE | Freq: Every day | ORAL | 6 refills | Status: DC

## 2019-06-19 NOTE — Addendum Note (Signed)
Encounter addended by: Sherran Needs, NP on: 06/19/2019 9:39 AM  Actions taken: Clinical Note Signed

## 2019-06-19 NOTE — Progress Notes (Addendum)
Primary Care Physician: Ria Bush, MD Referring Physician: [facility] ER f/u   Ernest Bell is a 73 y.o. male with a h/ restrictive pericarditis s/p pericardectomy in 1969, NASH/cirrhosis, AFib/flutter (hx reports typical flutter ablation 04/28/10, and PVI cryoablation Oct. 2011.Fleainide was stopped since 2017. No anticoagulation since 2017 as well for a chadsvasc score of 1.  He presented to [facility] ER for an elevated HR 9/3, with EKG showing rapid afib vrs an atypcial flutter. He had been aware of symptoms x 4 days. He had 3 doses of adenosine per EMS without response. He did convert with cardizem drip and was discharged on po cardizem as well as xarelto 20 mg daily. In the afib clinic,he reports that he has not had any more issues. Triggers discussed and none identified. He played tennis Friday without any issues.  Returns to afib clinic, 10/13, he has not had any further issues with afib. He will be able to stop his anticoagulation as his CHA2DS2VASc score is 1(age). Reviewed stable echo results with pt.  Today, he denies symptoms of palpitations, chest pain, shortness of breath, orthopnea, PND, lower extremity edema, dizziness, presyncope, syncope, or neurologic sequela. The patient is tolerating medications without difficulties and is otherwise without complaint today.   Past Medical History:  Diagnosis Date  . Atrial fib/flutter, transient 04/28/10   typical appearing atrial flutter dx 04/28/10 s/p CTI ablation 10/11  . BPH (benign prostatic hyperplasia)    PSA stable, followed by Dr. Eliberto Ivory  . CHF (congestive heart failure) ([place])   . 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 Coronary artery disease   . Depression    on sertraline  . DJD (degenerative joint disease)   . Gout   . Liver cirrhosis secondary to NASH (nonalcoholic steatohepatitis) ([place]) 2001   ?by liver biopsy  . Persistent atrial fibrillation 09/15/10   S/P [facility]VI (C[facility]o) at [facility]  . Syncope  1997   recurrent S/P tilt study by Dr. [name] (like due to dysautonomia)   Past Surgical History:  Procedure Laterality Date  . afib abation  09/15/10   Cryo at [place], CTI ablation at [facility] 04/28/10  . CHOLECYSTECTOMY  1995  . COLONOSCOPY  02/2000   2 hyperplastic polyps, [name]  . ESOPHAGOGASTRODUODENOSCOPY  02/2000   [name]  . HEMORROIDECTOMY  2007  . KNEE SURGERY  2007   he had osteoarthritis with left knee surgery  . PERICARDIECTOMY  1969   at [facility]  . right thumb surger     for osteoarthritis    Current Outpatient Medications  Medication Sig Dispense Refill  . diltiazem (CARDIZEM CD) 120 MG 24 hr capsule Take 1 capsule (120 mg total) by mouth daily. 30 capsule 6  . diltiazem (CARDIZEM) 30 MG tablet Take 1 tablet (30 mg total) by mouth every 6 (six) hours as needed. (Patient taking differently: Take 30 mg by mouth 2 (two) times daily. Taking one tablet two times daily) 45 tablet 1  . ibuprofen (ADVIL,MOTRIN) 800 MG tablet Take 1 tablet by mouth every 8 (eight) hours as needed for pain.  1  . Tamsulosin HCl (FLOMAX) 0.4 MG CAPS Take 0.4 mg by mouth 2 (two) times daily.      No current facility-administered medications for this encounter.     Allergies  Allergen Reactions  . Penicillins Rash    Social History   Socioeconomic History  . Marital status: Married    Spouse name: Not on file  .  Number of children: Not on file  . Years of education: Not on file  . Highest education level: Not on file  Occupational History  . 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 Financial resource strain: Not on file  . Food insecurity    Worry: Not on file    Inability: Not on file  . Transportation needs    Medical: Not on file    Non-medical: Not on file  Tobacco Use  . Smoking status: Never Smoker  . Smokeless tobacco: Never Used  Substance and Sexual  Activity  . Alcohol use: No  . Drug use: No  . Sexual activity: Yes  Lifestyle  . Physical activity    Days per week: Not on file    Minutes per session: Not on file  . Stress: Not on file  Relationships  . Social Herbalist on phone: Not on file    Gets together: Not on file    Attends religious service: Not on file    Active member of club or organization: Not on file    Attends meetings of clubs or organizations: Not on file    Relationship status: Not on file  . Intimate partner violence    Fear of current or ex partner: Not on file    Emotionally abused: Not on file    Physically abused: Not on file    Forced sexual activity: Not on file  Other Topics Concern  . 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   Diet: good water, fruits/vegetables daily    Family History  Problem Relation Age of Onset  . Hypertension Mother   . Alcohol abuse Father        smoker  . Heart disease Father        MI (smoker)    ROS- All systems are reviewed and negative except as per the HPI above  Physical Exam: Vitals:   06/19/19 0858  BP: (!) 150/70  Pulse: 72  Weight: 78.3 kg  Height: 5\' 6"  (1.676 m)   Wt Readings from Last 3 Encounters:  06/19/19 78.3 kg  05/20/19 76.6 kg  05/09/19 78.2 kg    Labs: Lab Results  Component Value Date   NA 141 05/09/2019   K 4.0 05/09/2019   CL 112 (H) 05/09/2019   CO2 19 (L) 05/09/2019   GLUCOSE 118 (H) 05/09/2019   BUN 20 05/09/2019   CREATININE 0.94 05/09/2019   CALCIUM 9.1 05/09/2019   MG 2.2 05/09/2019   Lab Results  Component Value Date   INR 1.2 (H) 04/15/2016   Lab Results  Component Value Date   CHOL 164 07/16/2018   HDL 43.80 07/16/2018   LDLCALC 99 07/16/2018   TRIG 110.0 07/16/2018     GEN- The patient is well  appearing, alert and oriented x 3 today.   Head- normocephalic, atraumatic Eyes-  Sclera clear, conjunctiva pink Ears- hearing intact Oropharynx- clear Neck- supple, no JVP Lymph- no cervical lymphadenopathy Lungs- Clear to ausculation bilaterally, normal work of breathing Heart- Regular rate and rhythm, no murmurs, rubs or gallops, PMI not laterally displaced GI- soft, NT, ND, + BS Extremities- no clubbing, cyanosis,  or edema MS- no significant deformity or atrophy Skin- no rash or lesion Psych- euthymic mood, full affect Neuro- strength and sensation are intact  EKG-Sinus rhythm with PAC's at 74 bpm, qrs int 80 ms, qtc 472 ms Epic records reviewed Echo- IMPRESSIONS    1. Left ventricular ejection fraction, by visual estimation, is 60 to 65%. The left ventricle has normal function. Normal left ventricular size. There is no left ventricular hypertrophy.  2. Left ventricular diastolic Doppler parameters are consistent with pseudonormalization pattern of LV diastolic filling.  3. Global right ventricle has normal systolic function.The right ventricular size is normal. No increase in right ventricular wall thickness.  4. Left atrial size was mildly dilated.  5. Right atrial size was normal.  6. The mitral valve is normal in structure. Mild to moderate mitral valve regurgitation. No evidence of mitral stenosis.  7. The tricuspid valve is normal in structure. Tricuspid valve regurgitation was not visualized by color flow Doppler.  8. The aortic valve is normal in structure. Aortic valve regurgitation was not visualized by color flow Doppler. Structurally normal aortic valve, with no evidence of sclerosis or stenosis.  9. The pulmonic valve was normal in structure. Pulmonic valve regurgitation is mild by color flow Doppler. 10. The inferior vena cava is normal in size with greater than 50% respiratory variability, suggesting right atrial pressure of 3 mmHg.    Assessment and Plan: 1.  Paroxysmal afib/flutter  Has been very quiet until recently  Prior treatment with flecainide  Sudden onset without any identifiable triggers  Continue diltiazem 120 mg daily, 30 mg as needed for breakthrough afib Echo results reviewed  2. CHA2DS2VASc score of 1  Can stop anticoagulation for CHA2DS2VASc score of 1 Keep on hand in case he goes back into prolonged  afib   F/u as needed in afib clinic  Butch Penny C. Anapaula Severt, [facility] [address] [place], [place] [address] [phone]

## 2019-06-27 ENCOUNTER — Other Ambulatory Visit (HOSPITAL_COMMUNITY): Payer: Self-pay | Admitting: *Deleted

## 2019-06-27 MED ORDER — DILTIAZEM HCL 30 MG PO TABS: ORAL_TABLET | ORAL | 1 refills | Status: DC

## 2019-07-24 ENCOUNTER — Ambulatory Visit: Payer: Medicare Other

## 2019-07-30 ENCOUNTER — Other Ambulatory Visit: Payer: Self-pay | Admitting: Family Medicine

## 2019-07-30 DIAGNOSIS — R739 Hyperglycemia, unspecified: Secondary | ICD-10-CM

## 2019-07-30 DIAGNOSIS — K7581 Nonalcoholic steatohepatitis (NASH): Secondary | ICD-10-CM

## 2019-07-30 DIAGNOSIS — K7460 Unspecified cirrhosis of liver: Secondary | ICD-10-CM

## 2019-07-30 DIAGNOSIS — E785 Hyperlipidemia, unspecified: Secondary | ICD-10-CM

## 2019-07-30 DIAGNOSIS — Z125 Encounter for screening for malignant neoplasm of prostate: Secondary | ICD-10-CM

## 2019-07-31 ENCOUNTER — Other Ambulatory Visit: Payer: Self-pay

## 2019-07-31 ENCOUNTER — Other Ambulatory Visit: Payer: Medicare Other

## 2019-07-31 ENCOUNTER — Ambulatory Visit (INDEPENDENT_AMBULATORY_CARE_PROVIDER_SITE_OTHER): Payer: Medicare Other

## 2019-07-31 DIAGNOSIS — Z0000 Encounter for general adult medical examination without abnormal findings: Secondary | ICD-10-CM

## 2019-07-31 NOTE — Patient Instructions (Signed)
Ernest Bell , Thank you for taking time to come for your Medicare Wellness Visit. I appreciate your ongoing commitment to your health goals. Please review the following plan we discussed and let me know if I can assist you in the future.   Screening recommendations/referrals: Colonoscopy: FOBT completed 05/07/2019 Recommended yearly ophthalmology/optometry visit for glaucoma screening and checkup Recommended yearly dental visit for hygiene and checkup  Vaccinations: Influenza vaccine: will get at physical Pneumococcal vaccine: Completed series Tdap vaccine: Up to date, completed 03/25/2013 Shingles vaccine: will check with insurance    Advanced directives: Please bring a copy of your POA (Power of [place]) and/or Living Will to your next appointment.   Conditions/risks identified: hyperlipidemia  Next appointment: 08/09/2019 @ 7:30 am   Preventive Care 65 Years and Older, Male Preventive care refers to lifestyle choices and visits with your health care provider that can promote health and wellness. What does preventive care include?  A yearly physical exam. This is also called an annual well check.  Dental exams once or twice a year.  Routine eye exams. Ask your health care provider how often you should have your eyes checked.  Personal lifestyle choices, including:  Daily care of your teeth and gums.  Regular physical activity.  Eating a healthy diet.  Avoiding tobacco and drug use.  Limiting alcohol use.  Practicing safe sex.  Taking low doses of aspirin every day.  Taking vitamin and mineral supplements as recommended by your health care provider. What happens during an annual well check? The services and screenings done by your health care provider during your annual well check will depend on your age, overall health, lifestyle risk factors, and family history of disease. Counseling  Your health care provider may ask you questions about your:  Alcohol use.   Tobacco use.  Drug use.  Emotional well-being.  Home and relationship well-being.  Sexual activity.  Eating habits.  History of falls.  Memory and ability to understand (cognition).  Work and work Statistician. Screening  You may have the following tests or measurements:  Height, weight, and BMI.  Blood pressure.  Lipid and cholesterol levels. These may be checked every 5 years, or more frequently if you are over 73 years old.  Skin check.  Lung cancer screening. You may have this screening every year starting at age 73 if you have a 30-pack-year history of smoking and currently smoke or have quit within the past 15 years.  Fecal occult blood test (FOBT) of the stool. You may have this test every year starting at age 73.  Flexible sigmoidoscopy or colonoscopy. You may have a sigmoidoscopy every 5 years or a colonoscopy every 10 years starting at age 73.  Prostate cancer screening. Recommendations will vary depending on your family history and other risks.  Hepatitis C blood test.  Hepatitis B blood test.  Sexually transmitted disease (STD) testing.  Diabetes screening. This is done by checking your blood sugar (glucose) after you have not eaten for a while (fasting). You may have this done every 1-3 years.  Abdominal aortic aneurysm (AAA) screening. You may need this if you are a current or former smoker.  Osteoporosis. You may be screened starting at age 73 if you are at high risk. Talk with your health care provider about your test results, treatment options, and if necessary, the need for more tests. Vaccines  Your health care provider may recommend certain vaccines, such as:  Influenza vaccine. This is recommended every year.  Tetanus,  diphtheria, and acellular pertussis (Tdap, Td) vaccine. You may need a Td booster every 10 years.  Zoster vaccine. You may need this after age 73.  Pneumococcal 13-valent conjugate (PCV13) vaccine. One dose is recommended  after age 30.  Pneumococcal polysaccharide (PPSV23) vaccine. One dose is recommended after age 73. Talk to your health care provider about which screenings and vaccines you need and how often you need them. This information is not intended to replace advice given to you by your health care provider. Make sure you discuss any questions you have with your health care provider. Document Released: 09/18/2015 Document Revised: 05/11/2016 Document Reviewed: 06/23/2015 Elsevier Interactive Patient Education  2017 [place] Prevention in the Home Falls can cause injuries. They can happen to people of all ages. There are many things you can do to make your home safe and to help prevent falls. What can I do on the outside of my home?  Regularly fix the edges of walkways and driveways and fix any cracks.  Remove anything that might make you trip as you walk through a door, such as a raised step or threshold.  Trim any bushes or trees on the path to your home.  Use bright outdoor lighting.  Clear any walking paths of anything that might make someone trip, such as rocks or tools.  Regularly check to see if handrails are loose or broken. Make sure that both sides of any steps have handrails.  Any raised decks and porches should have guardrails on the edges.  Have any leaves, snow, or ice cleared regularly.  Use sand or salt on walking paths during winter.  Clean up any spills in your garage right away. This includes oil or grease spills. What can I do in the bathroom?  Use night lights.  Install grab bars by the toilet and in the tub and shower. Do not use towel bars as grab bars.  Use non-skid mats or decals in the tub or shower.  If you need to sit down in the shower, use a plastic, non-slip stool.  Keep the floor dry. Clean up any water that spills on the floor as soon as it happens.  Remove soap buildup in the tub or shower regularly.  Attach bath mats securely with  double-sided non-slip rug tape.  Do not have throw rugs and other things on the floor that can make you trip. What can I do in the bedroom?  Use night lights.  Make sure that you have a light by your bed that is easy to reach.  Do not use any sheets or blankets that are too big for your bed. They should not hang down onto the floor.  Have a firm chair that has side arms. You can use this for support while you get dressed.  Do not have throw rugs and other things on the floor that can make you trip. What can I do in the kitchen?  Clean up any spills right away.  Avoid walking on wet floors.  Keep items that you use a lot in easy-to-reach places.  If you need to reach something above you, use a strong step stool that has a grab bar.  Keep electrical cords out of the way.  Do not use floor polish or wax that makes floors slippery. If you must use wax, use non-skid floor wax.  Do not have throw rugs and other things on the floor that can make you trip. What can I do with  my stairs?  Do not leave any items on the stairs.  Make sure that there are handrails on both sides of the stairs and use them. Fix handrails that are broken or loose. Make sure that handrails are as long as the stairways.  Check any carpeting to make sure that it is firmly attached to the stairs. Fix any carpet that is loose or worn.  Avoid having throw rugs at the top or bottom of the stairs. If you do have throw rugs, attach them to the floor with carpet tape.  Make sure that you have a light switch at the top of the stairs and the bottom of the stairs. If you do not have them, ask someone to add them for you. What else can I do to help prevent falls?  Wear shoes that:  Do not have high heels.  Have rubber bottoms.  Are comfortable and fit you well.  Are closed at the toe. Do not wear sandals.  If you use a stepladder:  Make sure that it is fully opened. Do not climb a closed stepladder.  Make  sure that both sides of the stepladder are locked into place.  Ask someone to hold it for you, if possible.  Clearly mark and make sure that you can see:  Any grab bars or handrails.  First and last steps.  Where the edge of each step is.  Use tools that help you move around (mobility aids) if they are needed. These include:  Canes.  Walkers.  Scooters.  Crutches.  Turn on the lights when you go into a dark area. Replace any light bulbs as soon as they burn out.  Set up your furniture so you have a clear path. Avoid moving your furniture around.  If any of your floors are uneven, fix them.  If there are any pets around you, be aware of where they are.  Review your medicines with your doctor. Some medicines can make you feel dizzy. This can increase your chance of falling. Ask your doctor what other things that you can do to help prevent falls. This information is not intended to replace advice given to you by your health care provider. Make sure you discuss any questions you have with your health care provider. Document Released: 06/18/2009 Document Revised: 01/28/2016 Document Reviewed: 09/26/2014 Elsevier Interactive Patient Education  2017 [employer].

## 2019-07-31 NOTE — Progress Notes (Signed)
PCP notes:  Health Maintenance: Needs flu vaccine Will check with insurance regarding shingrix    Abnormal Screenings: none   Patient concerns: none   Nurse concerns: none   Next PCP appt.: 08/09/2019 @ 7:30 am

## 2019-07-31 NOTE — Progress Notes (Signed)
Subjective:   Ernest Bell is a 73 y.o. male who presents for Medicare Annual/Subsequent preventive examination.  Review of Systems: N/A   This visit is being conducted through telemedicine via telephone at the nurse health advisor's home address due to the COVID-19 pandemic. This patient has given me verbal consent via doximity to conduct this visit, patient states they are participating from their home address. Patient and myself are on the telephone call. There is no referral for this visit. Some vital signs may be absent or patient reported.    Patient identification: identified by name, DOB, and current address   Cardiac Risk Factors include: advanced age (>47mn, >>3women);dyslipidemia;male gender     Objective:    Vitals: There were no vitals taken for this visit.  There is no height or weight on file to calculate BMI.  Advanced Directives 07/31/2019 07/16/2018 07/07/2017 04/15/2016  Does Patient Have a Medical Advance Directive? Yes Yes Yes Yes  Type of AParamedicof AWeingartenLiving will Ernest Bell will Ernest Bell will Ernest Bell will  Does patient want to make changes to medical advance directive? - - - No - Patient declined  Copy of [place] Chart? No - copy requested No - copy requested No - copy requested No - copy requested    Tobacco Social History   Tobacco Use  Smoking Status Never Smoker  Smokeless Tobacco Never Used     Counseling given: Not Answered   Clinical Intake:  Pre-visit preparation completed: Yes  Pain : No/denies pain     Nutritional Risks: None Diabetes: No  How often do you need to have someone help you when you read instructions, pamphlets, or other written materials from your doctor or pharmacy?: 1 - Never What is the last grade level you completed in school?: Masters degree  Interpreter Needed?: No   Information entered by :: CJohnson, LPN  Past Medical History:  Diagnosis Date  . Atrial fib/flutter, transient 04/28/10   typical appearing atrial flutter dx 04/28/10 s/p CTI ablation 10/11  . BPH (benign prostatic hyperplasia)    PSA stable, followed by Dr. [name] . CHF (congestive heart failure) ([facility]   . 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 Coronary artery disease   . Depression    on sertraline  . DJD (degenerative joint disease)   . Gout   . Liver cirrhosis secondary to NASH (nonalcoholic steatohepatitis) (Ernest Bell 2001   ?by liver biopsy  . Persistent atrial fibrillation ([facility] 09/15/10   [facility] . Syncope 1997   recurrent S/P tilt study by Dr. KCaryl Comes(like due to dysautonomia)   Past Surgical History:  Procedure Laterality Date  . afib abation  09/15/10   [facility]  . CHOLECYSTECTOMY  1995  . COLONOSCOPY  02/2000   2 hyperplastic polyps, IBS ([name]  . ESOPHAGOGASTRODUODENOSCOPY  02/2000   HH ([name]  . HEMORROIDECTOMY  2007  . KNEE SURGERY  2007   he had osteoarthritis with left knee surgery  . [facility] . right [facility]ger     for osteoarthritis   Family History  Problem Relation Age of Onset  . Hypertension Mother   . Alcohol abuse Father        smoker  . Heart disease Father  MI (smoker)   Social History   Socioeconomic History  . Marital status: Married    Spouse name: Not on file  . Number of children: Not on file  . Years of education: Not on file  . Highest education level: Not on file  Occupational History  . 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 Financial resource strain: Not hard at all  . Food insecurity    Worry: Never true    Inability: Never true  . Transportation needs    Medical: No    Non-medical: No   Tobacco Use  . Smoking status: Never Smoker  . Smokeless tobacco: Never Used  Substance and Sexual Activity  . Alcohol use: No  . Drug use: No  . Sexual activity: Yes  Lifestyle  . Physical activity    Days per week: 3 days    Minutes per session: 60 min  . Stress: Not at all  Relationships  . Social Herbalist on phone: Not on file    Gets together: Not on file    Attends religious service: Not on file    Active member of club or organization: Not on file    Attends meetings of clubs or organizations: Not on file    Relationship status: Not on file  Other Topics Concern  . 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   Diet: good water, fruits/vegetables daily    Outpatient Encounter Medications as of 07/31/2019  Medication Sig  . diltiazem (CARDIZEM CD) 120 MG 24 hr capsule Take 1 capsule (120 mg total) by mouth daily.  Marland Kitchen diltiazem (CARDIZEM) 30 MG tablet Take 1 tablet every 4 hours AS NEEDED for heart rate >100  . ibuprofen (ADVIL,MOTRIN) 800 MG tablet Take 1 tablet by mouth every 8 (eight) hours as needed for pain.  . Tamsulosin HCl (FLOMAX) 0.4 MG CAPS Take 0.4 mg by mouth 2 (two) times daily.    No facility-administered encounter medications on file as of 07/31/2019.     Activities of Daily Living In your present state of health, do you have any difficulty performing the following activities: 07/31/2019  Hearing? N  Vision? N  Difficulty concentrating or making decisions? N  Walking or climbing stairs? N  Dressing or bathing? N  Doing errands, shopping? N  Preparing Food and eating ? N  Using the Toilet? N  In the past six months, have you accidently leaked urine? N  Do you have problems with loss of bowel control? N  Managing your Medications? N  Managing your  Finances? N  Housekeeping or managing your Housekeeping? N  Some recent data might be hidden    Patient Care Team: Ria Bush, MD as PCP - General (Family Medicine) [name], MD as Consulting Physician (Cardiology) Royston Cowper, MD as Consulting Physician (Urology)   Assessment:   This is a routine wellness examination for Ernest Bell.  Exercise Activities and Dietary recommendations Current Exercise Habits: Home exercise routine, Type of exercise: Other - see comments(plays tennis), Time (Minutes): 60, Frequency (Times/Week): 3, Weekly Exercise (Minutes/Week): 180, Intensity: Moderate, Exercise limited by: None identified  Goals    . Increase physical activity  Starting 07/16/2018, I will continue to exercise for at least 60 minutes daily.     . Patient Stated     07/31/2019, I will maintain and continue medications as prescribed.        Fall Risk Fall Risk  07/31/2019 07/16/2018 07/07/2017 04/15/2016 03/03/2014  Falls in the past year? 0 0 No No No  Number falls in past yr: 0 - - - -  Injury with Fall? 0 - - - -  Risk for fall due to : Medication side effect - - - -  Follow up Falls evaluation completed;Falls prevention discussed - - - -   Is the patient's home free of loose throw rugs in walkways, pet beds, electrical cords, etc?   yes      Grab bars in the bathroom? yes      Handrails on the stairs?   yes      Adequate lighting?   yes  Timed Get Up and Go Performed: N/A  Depression Screen PHQ 2/9 Scores 07/31/2019 07/16/2018 07/07/2017 04/15/2016  PHQ - 2 Score 0 0 0 0  PHQ- 9 Score 0 0 0 -    Cognitive Function MMSE - Mini Mental State Exam 07/31/2019 07/16/2018 07/07/2017 04/15/2016  Orientation to time 5 5 5 5   Orientation to Place 5 5 5 5   Registration 3 3 3 3   Attention/ Calculation 5 0 0 0  Recall 3 3 3 3   Language- name 2 objects - 0 0 0  Language- repeat 1 1 1 1   Language- follow 3 step command - 3 3 3   Language- read & follow direction - 0 0  0  Write a sentence - 0 0 0  Copy design - 0 0 0  Total score - 20 20 20   Mini Cog  Mini-Cog screen was completed. Maximum score is 22. A value of 0 denotes this part of the MMSE was not completed or the patient failed this part of the Mini-Cog screening.       Immunization History  Administered Date(s) Administered  . Influenza Whole 05/07/2011  . Influenza, Seasonal, Injecte, Preservative Fre 08/18/2015  . Influenza,inj,Quad PF,6+ Mos 06/27/2016, 08/21/2018  . Influenza-Unspecified 06/23/2017  . Pneumococcal Conjugate-13 03/03/2014  . Pneumococcal Polysaccharide-23 07/26/2011  . Td 01/26/2010, 03/25/2013  . Zoster 08/18/2015    Qualifies for Shingles Vaccine? Yes  Screening Tests Health Maintenance  Topic Date Due  . COLONOSCOPY  09/05/2019 (Originally 02/15/2010)  . INFLUENZA VACCINE  12/04/2019 (Originally 04/06/2019)  . DTaP/Tdap/Td (1 - Tdap) 03/26/2023 (Originally 05/16/1965)  . COLON CANCER SCREENING ANNUAL FOBT  05/06/2020  . TETANUS/TDAP  03/26/2023  . Hepatitis C Screening  Completed  . PNA vac Low Risk Adult  Completed   Cancer Screenings: Lung: Low Dose CT Chest recommended if Age 4-80 years, 30 pack-year currently smoking OR have quit w/in 15years. Patient does not qualify. Colorectal: FOBT completed 05/07/2019  Additional Screenings:  Hepatitis C Screening: 04/15/2016      Plan:   Patient will maintain and continue medications as prescribed.   I have personally reviewed and noted the following in the patient's chart:   . Medical and social history . Use of alcohol, tobacco or illicit drugs  . Current medications and supplements . Functional ability and status . Nutritional status . Physical activity . Advanced directives . List of other physicians . Hospitalizations, surgeries, and ER visits in previous 12 months . Vitals . Screenings to include cognitive, depression, and falls . Referrals and appointments  In addition, I have reviewed and  discussed with patient certain preventive protocols, quality metrics, and best practice recommendations. A written personalized care plan for preventive services as well as general preventive health recommendations were provided to patient.     Andrez Grime, LPN  [id number]

## 2019-08-05 ENCOUNTER — Other Ambulatory Visit: Payer: Self-pay

## 2019-08-05 ENCOUNTER — Other Ambulatory Visit (INDEPENDENT_AMBULATORY_CARE_PROVIDER_SITE_OTHER): Payer: Medicare Other

## 2019-08-05 DIAGNOSIS — K7581 Nonalcoholic steatohepatitis (NASH): Secondary | ICD-10-CM

## 2019-08-05 DIAGNOSIS — R739 Hyperglycemia, unspecified: Secondary | ICD-10-CM | POA: Diagnosis not present

## 2019-08-05 DIAGNOSIS — K7460 Unspecified cirrhosis of liver: Secondary | ICD-10-CM

## 2019-08-05 DIAGNOSIS — E785 Hyperlipidemia, unspecified: Secondary | ICD-10-CM

## 2019-08-05 DIAGNOSIS — Z125 Encounter for screening for malignant neoplasm of prostate: Secondary | ICD-10-CM

## 2019-08-05 LAB — COMPREHENSIVE METABOLIC PANEL
ALT: 38 U/L (ref 0–53)
AST: 24 U/L (ref 0–37)
Albumin: 4.4 g/dL (ref 3.5–5.2)
Alkaline Phosphatase: 59 U/L (ref 39–117)
BUN: 20 mg/dL (ref 6–23)
CO2: 28 mEq/L (ref 19–32)
Calcium: 9.6 mg/dL (ref 8.4–10.5)
Chloride: 106 mEq/L (ref 96–112)
Creatinine, Ser: 0.93 mg/dL (ref 0.40–1.50)
GFR: 79.6 mL/min (ref >60.00)
Glucose, Bld: 124 mg/dL — ABNORMAL HIGH (ref 70–99)
Potassium: 4.2 mEq/L (ref 3.5–5.1)
Sodium: 141 mEq/L (ref 135–145)
Total Bilirubin: 2.4 mg/dL — ABNORMAL HIGH (ref 0.2–1.2)
Total Protein: 6.7 g/dL (ref 6.0–8.3)

## 2019-08-05 LAB — CBC WITH DIFFERENTIAL/PLATELET
Basophils Absolute: 0 10*3/uL (ref 0.0–0.1)
Basophils Relative: 0.8 % (ref 0.0–3.0)
Eosinophils Absolute: 0.2 10*3/uL (ref 0.0–0.7)
Eosinophils Relative: 3.3 % (ref 0.0–5.0)
HCT: 43.1 % (ref 39.0–52.0)
Hemoglobin: 15.1 g/dL (ref 13.0–17.0)
Lymphocytes Relative: 30.5 % (ref 12.0–46.0)
Lymphs Abs: 1.9 10*3/uL (ref 0.7–4.0)
MCHC: 35.1 g/dL (ref 30.0–36.0)
MCV: 89.1 fl (ref 78.0–100.0)
Monocytes Absolute: 0.5 10*3/uL (ref 0.1–1.0)
Monocytes Relative: 8.6 % (ref 3.0–12.0)
Neutro Abs: 3.5 10*3/uL (ref 1.4–7.7)
Neutrophils Relative %: 56.8 % (ref 43.0–77.0)
Platelets: 209 10*3/uL (ref 150.0–400.0)
RBC: 4.83 Mil/uL (ref 4.22–5.81)
RDW: 13.5 % (ref 11.5–15.5)
WBC: 6.1 10*3/uL (ref 4.0–10.5)

## 2019-08-05 LAB — LIPID PANEL
Cholesterol: 195 mg/dL (ref 0–200)
HDL: 40.4 mg/dL (ref >39.00)
LDL Cholesterol: 126 mg/dL — ABNORMAL HIGH (ref 0–99)
NonHDL: 154.17
Total CHOL/HDL Ratio: 5
Triglycerides: 139 mg/dL (ref 0.0–149.0)
VLDL: 27.8 mg/dL (ref 0.0–40.0)

## 2019-08-05 LAB — PROTIME-INR
INR: 1.1 ratio — ABNORMAL HIGH (ref 0.8–1.0)
Prothrombin Time: 12.7 s (ref 9.6–13.1)

## 2019-08-05 LAB — HEMOGLOBIN A1C: Hgb A1c MFr Bld: 5.2 % (ref 4.6–6.5)

## 2019-08-05 LAB — PSA: PSA: 2.39 ng/mL (ref 0.10–4.00)

## 2019-08-05 LAB — TSH: TSH: 2.57 u[IU]/mL (ref 0.35–4.50)

## 2019-08-09 ENCOUNTER — Encounter: Payer: Medicare Other | Admitting: Family Medicine

## 2019-08-12 ENCOUNTER — Ambulatory Visit (INDEPENDENT_AMBULATORY_CARE_PROVIDER_SITE_OTHER): Payer: Medicare Other | Admitting: Family Medicine

## 2019-08-12 ENCOUNTER — Encounter: Payer: Self-pay | Admitting: Family Medicine

## 2019-08-12 ENCOUNTER — Other Ambulatory Visit: Payer: Self-pay

## 2019-08-12 VITALS — BP 130/68 | HR 71 | Temp 98.2°F | Ht 65.0 in | Wt 168.0 lb

## 2019-08-12 DIAGNOSIS — Z7189 Other specified counseling: Secondary | ICD-10-CM | POA: Diagnosis not present

## 2019-08-12 DIAGNOSIS — F325 Major depressive disorder, single episode, in full remission: Secondary | ICD-10-CM

## 2019-08-12 DIAGNOSIS — Z0000 Encounter for general adult medical examination without abnormal findings: Secondary | ICD-10-CM | POA: Diagnosis not present

## 2019-08-12 DIAGNOSIS — E785 Hyperlipidemia, unspecified: Secondary | ICD-10-CM

## 2019-08-12 DIAGNOSIS — I480 Paroxysmal atrial fibrillation: Secondary | ICD-10-CM

## 2019-08-12 DIAGNOSIS — Z23 Encounter for immunization: Secondary | ICD-10-CM | POA: Diagnosis not present

## 2019-08-12 DIAGNOSIS — K7581 Nonalcoholic steatohepatitis (NASH): Secondary | ICD-10-CM

## 2019-08-12 DIAGNOSIS — R739 Hyperglycemia, unspecified: Secondary | ICD-10-CM

## 2019-08-12 DIAGNOSIS — K7460 Unspecified cirrhosis of liver: Secondary | ICD-10-CM

## 2019-08-12 HISTORY — DX: Other specified counseling: Z71.89

## 2019-08-12 NOTE — Assessment & Plan Note (Signed)
Preventative protocols reviewed and updated unless pt declined. Discussed healthy diet and lifestyle.  

## 2019-08-12 NOTE — Assessment & Plan Note (Addendum)
Chronic. Anticipate related to known cirrhosis.

## 2019-08-12 NOTE — Patient Instructions (Addendum)
EKG today.  Flu shot today.  We will sign you up for cologuard.  If interested, check with pharmacy about new 2 shot shingles series (shingrix).  Bring me copy of your advanced directives.  Good to see you today, call us with questions Return as needed or in 1 year for next physical.  Health Maintenance After Age 73 After age 72, you are at a higher risk for certain long-term diseases and infections as well as injuries from falls. Falls are a major cause of broken bones and head injuries in people who are older than age 83. Getting regular preventive care can help to keep you healthy and well. Preventive care includes getting regular testing and making lifestyle changes as recommended by your health care provider. Talk with your health care provider about:  Which screenings and tests you should have. A screening is a test that checks for a disease when you have no symptoms.  A diet and exercise plan that is right for you. What should I know about screenings and tests to prevent falls? Screening and testing are the best ways to find a health problem early. Early diagnosis and treatment give you the best chance of managing medical conditions that are common after age 21. Certain conditions and lifestyle choices may make you more likely to have a fall. Your health care provider may recommend:  Regular vision checks. Poor vision and conditions such as cataracts can make you more likely to have a fall. If you wear glasses, make sure to get your prescription updated if your vision changes.  Medicine review. Work with your health care provider to regularly review all of the medicines you are taking, including over-the-counter medicines. Ask your health care provider about any side effects that may make you more likely to have a fall. Tell your health care provider if any medicines that you take make you feel dizzy or sleepy.  Osteoporosis screening. Osteoporosis is a condition that causes the bones to  get weaker. This can make the bones weak and cause them to break more easily.  Blood pressure screening. Blood pressure changes and medicines to control blood pressure can make you feel dizzy.  Strength and balance checks. Your health care provider may recommend certain tests to check your strength and balance while standing, walking, or changing positions.  Foot health exam. Foot pain and numbness, as well as not wearing proper footwear, can make you more likely to have a fall.  Depression screening. You may be more likely to have a fall if you have a fear of falling, feel emotionally low, or feel unable to do activities that you used to do.  Alcohol use screening. Using too much alcohol can affect your balance and may make you more likely to have a fall. What actions can I take to lower my risk of falls? General instructions  Talk with your health care provider about your risks for falling. Tell your health care provider if: ? You fall. Be sure to tell your health care provider about all falls, even ones that seem minor. ? You feel dizzy, sleepy, or off-balance.  Take over-the-counter and prescription medicines only as told by your health care provider. These include any supplements.  Eat a healthy diet and maintain a healthy weight. A healthy diet includes low-fat dairy products, low-fat (lean) meats, and fiber from whole grains, beans, and lots of fruits and vegetables. Home safety  Remove any tripping hazards, such as rugs, cords, and clutter.  Install safety  equipment such as grab bars in bathrooms and safety rails on stairs.  Keep rooms and walkways well-lit. Activity   Follow a regular exercise program to stay fit. This will help you maintain your balance. Ask your health care provider what types of exercise are appropriate for you.  If you need a cane or walker, use it as recommended by your health care provider.  Wear supportive shoes that have nonskid  soles. Lifestyle  Do not drink alcohol if your health care provider tells you not to drink.  If you drink alcohol, limit how much you have: ? 0-1 drink a day for women. ? 0-2 drinks a day for men.  Be aware of how much alcohol is in your drink. In the [place] one drink equals one typical bottle of beer (12 oz), one-half glass of wine (5 oz), or one shot of hard liquor (1 oz).  Do not use any products that contain nicotine or tobacco, such as cigarettes and e-cigarettes. If you need help quitting, ask your health care provider. Summary  Having a healthy lifestyle and getting preventive care can help to protect your health and wellness after age 72.  Screening and testing are the best way to find a health problem early and help you avoid having a fall. Early diagnosis and treatment give you the best chance for managing medical conditions that are more common for people who are older than age 14.  Falls are a major cause of broken bones and head injuries in people who are older than age 47. Take precautions to prevent a fall at home.  Work with your health care provider to learn what changes you can make to improve your health and wellness and to prevent falls. This information is not intended to replace advice given to you by your health care provider. Make sure you discuss any questions you have with your health care provider. Document Released: 07/05/2017 Document Revised: 12/13/2018 Document Reviewed: 07/05/2017 Elsevier Patient Education  2020 [employer].

## 2019-08-12 NOTE — Assessment & Plan Note (Signed)
Saw afib clinic, continues cardizem CD daily with extra dilt IR PRN breakthrough. Now off anticoagulation due to low stroke risk. Sounded irregular today - check EKG - sinus rhythm.

## 2019-08-12 NOTE — Assessment & Plan Note (Signed)
Chronic, stable off meds. The 10-year ASCVD risk score Mikey Bussing DC Brooke Bonito., et al., 2013) is: 27.5%   Values used to calculate the score:     Age: 73 years     Sex: Male     Is Non-Hispanic African American: No     Diabetic: No     Tobacco smoker: No     Systolic Blood Pressure: [id number]     Is BP treated: Yes     HDL Cholesterol: 40.4 mg/dL     Total Cholesterol: 195 mg/dL

## 2019-08-12 NOTE — Assessment & Plan Note (Signed)
A1c reassuring. Encourage limiting added sugars. Consider fructosamine next labs.

## 2019-08-12 NOTE — Assessment & Plan Note (Signed)
Stable abd [place] 09/2018. Continue to monitor.

## 2019-08-12 NOTE — Assessment & Plan Note (Signed)
Stable period off SSRI.

## 2019-08-12 NOTE — Progress Notes (Addendum)
This visit was conducted in person.  BP 130/68 (BP Location: Left Arm, Patient Position: Sitting, Cuff Size: Normal)   Pulse 71   Temp 98.2 F (36.8 C) (Temporal)   Ht 5\' 5"  (1.651 m)   Wt 168 lb (76.2 kg)   SpO2 98%   BMI 27.96 kg/m    CC: CPE Subjective:    Patient ID: Ernest Bell, male    DOB: Oct 14, 1945, 73 y.o.   MRN: [id number]  HPI: Ernest Bell is a 73 y.o. male presenting on 08/12/2019 for Annual Exam (Prt 2.)   Saw health advisor 2 wks ago for medicare wellness visit. Note reviewed.   No exam data present    Clinical Support from 07/31/2019 in [place] at [facility] Total Score  0      Fall Risk  07/31/2019 07/16/2018 07/07/2017 04/15/2016 03/03/2014  Falls in the past year? 0 0 No No No  Number falls in past yr: 0 - - - -  Injury with Fall? 0 - - - -  Risk for fall due to : Medication side effect - - - -  Follow up Falls evaluation completed;Falls prevention discussed - - - -   90yo mother passed away from [facility] May 21, 2019. Sister and BIL and brothers also contracted Covid but they did well.   Known NASH cirrhosis.  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   New onset parox afib/aflutter this year established with afib clinic on diltiazem 120mg  daily, 30mg  PRN breakthrough afib. Was able to be taken off anticoagulant due to low CHADSVASc2 score. Released from [place] clinic 06/2019.   Preventative: COLONOSCOPY 02/2000-2 hyperplastic polyps, [name]. Has done IFOBT through insurance - agrees to [facility] through our office.  Prostate cancer screening - h/o BPH - sees Dr. Eliberto Ivory urologist yearly.  Flu shot - yearly  Pneumovax 2012. [id number]  Td 03/2013  zostavax -08/2015  shingrix - discussed  Advanced directives: has at home. HCPOA is wife.Will bring me copy. Seat belt use discussed Sunscreen use discussed. No changing moles on skin.  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  Diet: good water, fruits/vegetables daily.      Relevant past medical, surgical, family and social history reviewed and updated as indicated. Interim medical history since our last visit reviewed. Allergies and medications reviewed and updated. Outpatient Medications Prior to Visit  Medication Sig Dispense Refill  . diltiazem (CARDIZEM CD) 120 MG 24 hr capsule Take 1 capsule (120 mg total) by mouth daily. 30 capsule 6  . diltiazem (CARDIZEM) 30 MG tablet Take 1 tablet every 4 hours AS NEEDED for heart rate >100 45 tablet 1  . ibuprofen (ADVIL,MOTRIN) 800 MG tablet Take 1 tablet by mouth every 8 (eight) hours as needed for pain.  1  . Tamsulosin HCl (FLOMAX) 0.4 MG CAPS Take 0.4 mg by mouth 2 (two) times daily.      No facility-administered medications prior to visit.      Per HPI unless specifically indicated in ROS section below Review of Systems  Constitutional: Negative for activity change, appetite change, chills, fatigue, fever and unexpected weight change.  HENT: Negative for hearing loss.   Eyes: Negative for visual disturbance.  Respiratory: Negative for cough, chest tightness, shortness  of breath and wheezing.   Cardiovascular: Negative for chest pain, palpitations and leg swelling.  Gastrointestinal: Negative for abdominal distention, abdominal pain, blood in stool, constipation, diarrhea, nausea and vomiting.  Genitourinary: Negative for difficulty urinating and hematuria.  Musculoskeletal: Negative for arthralgias, myalgias and neck pain.  Skin: Negative for rash.  Neurological: Negative for dizziness, seizures, syncope and headaches.  Hematological: Negative for adenopathy. Does not bruise/bleed easily.  Psychiatric/Behavioral: Negative for dysphoric mood. The patient is not  nervous/anxious.    Objective:    BP 130/68 (BP Location: Left Arm, Patient Position: Sitting, Cuff Size: Normal)   Pulse 71   Temp 98.2 F (36.8 C) (Temporal)   Ht 5\' 5"  (1.651 m)   Wt 168 lb (76.2 kg)   SpO2 98%   BMI 27.96 kg/m   Wt Readings from Last 3 Encounters:  08/12/19 168 lb (76.2 kg)  06/19/19 172 lb 9.6 oz (78.3 kg)  05/20/19 168 lb 12.8 oz (76.6 kg)    Physical Exam Vitals signs and nursing note reviewed.  Constitutional:      General: He is not in acute distress.    Appearance: Normal appearance. He is well-developed. He is not ill-appearing.  HENT:     Head: Normocephalic and atraumatic.     Right Ear: Hearing, tympanic membrane, ear canal and external ear normal.     Left Ear: Hearing, tympanic membrane, ear canal and external ear normal.     Nose: Nose normal.     Mouth/Throat:     Pharynx: Uvula midline.  Eyes:     General: No scleral icterus.    Extraocular Movements: Extraocular movements intact.     Conjunctiva/sclera: Conjunctivae normal.     Pupils: Pupils are equal, round, and reactive to light.  Neck:     Musculoskeletal: Normal range of motion and neck supple.     Vascular: No carotid bruit.  Cardiovascular:     Rate and Rhythm: Normal rate. Rhythm irregular.     Pulses: Normal pulses.          Radial pulses are 2+ on the right side and 2+ on the left side.     Heart sounds: Normal heart sounds. No murmur.  Pulmonary:     Effort: Pulmonary effort is normal. No respiratory distress.     Breath sounds: Normal breath sounds. No wheezing, rhonchi or rales.  Abdominal:     General: Abdomen is flat. Bowel sounds are normal. There is no distension.     Palpations: Abdomen is soft. There is no mass.     Tenderness: There is no abdominal tenderness. There is no guarding or rebound.     Hernia: No hernia is present.  Musculoskeletal: Normal range of motion.     Right lower leg: No edema.     Left lower leg: No edema.  Lymphadenopathy:      Cervical: No cervical adenopathy.  Skin:    General: Skin is warm and dry.     Findings: No rash.  Neurological:     General: No focal deficit present.     Mental Status: He is alert and oriented to person, place, and time.     Comments: CN grossly intact, station and gait intact  Psychiatric:        Mood and Affect: Mood normal.        Behavior: Behavior normal.        Thought Content: Thought content normal.        Judgment: Judgment normal.  Results for orders placed or performed in visit on 08/05/19  TSH  Result Value Ref Range   TSH 2.57 0.35 - 4.50 uIU/mL  Protime-INR  Result Value Ref Range   INR 1.1 (H) 0.8 - 1.0 ratio   Prothrombin Time 12.7 9.6 - 13.1 sec  CBC with Differential  Result Value Ref Range   WBC 6.1 4.0 - 10.5 K/uL   RBC 4.83 4.22 - 5.81 Mil/uL   Hemoglobin 15.1 13.0 - 17.0 g/dL   HCT 43.1 39.0 - 52.0 %   MCV 89.1 78.0 - 100.0 fl   MCHC 35.1 30.0 - 36.0 g/dL   RDW 13.5 11.5 - 15.5 %   Platelets 209.0 150.0 - 400.0 K/uL   Neutrophils Relative % 56.8 43.0 - 77.0 %   Lymphocytes Relative 30.5 12.0 - 46.0 %   Monocytes Relative 8.6 3.0 - 12.0 %   Eosinophils Relative 3.3 0.0 - 5.0 %   Basophils Relative 0.8 0.0 - 3.0 %   Neutro Abs 3.5 1.4 - 7.7 K/uL   Lymphs Abs 1.9 0.7 - 4.0 K/uL   Monocytes Absolute 0.5 0.1 - 1.0 K/uL   Eosinophils Absolute 0.2 0.0 - 0.7 K/uL   Basophils Absolute 0.0 0.0 - 0.1 K/uL  PSA  Result Value Ref Range   PSA 2.39 0.10 - 4.00 ng/mL  Hemoglobin A1c  Result Value Ref Range   Hgb A1c MFr Bld 5.2 4.6 - 6.5 %  Comprehensive metabolic panel  Result Value Ref Range   Sodium 141 135 - 145 mEq/L   Potassium 4.2 3.5 - 5.1 mEq/L   Chloride 106 96 - 112 mEq/L   CO2 28 19 - 32 mEq/L   Glucose, Bld 124 (H) 70 - 99 mg/dL   BUN 20 6 - 23 mg/dL   Creatinine, Ser 0.93 0.40 - 1.50 mg/dL   Total Bilirubin 2.4 (H) 0.2 - 1.2 mg/dL   Alkaline Phosphatase 59 39 - 117 U/L   AST 24 0 - 37 U/L   ALT 38 0 - 53 U/L   Total Protein  6.7 6.0 - 8.3 g/dL   Albumin 4.4 3.5 - 5.2 g/dL   GFR 79.60 >60.00 mL/min   Calcium 9.6 8.4 - 10.5 mg/dL  Lipid panel  Result Value Ref Range   Cholesterol 195 0 - 200 mg/dL   Triglycerides 139.0 0.0 - 149.0 mg/dL   HDL 40.40 >39.00 mg/dL   VLDL 27.8 0.0 - 40.0 mg/dL   LDL Cholesterol 126 (H) 0 - 99 mg/dL   Total CHOL/HDL Ratio 5    NonHDL 154.17    EKG - sinus arrhythmia ~70s with frequent PVCs, no acute ST/T changes.  Assessment & Plan:  This visit occurred during the SARS-CoV-2 public health emergency.  Safety protocols were in place, including screening questions prior to the visit, additional usage of staff PPE, and extensive cleaning of exam room while observing appropriate contact time as indicated for disinfecting solutions.   Problem List Items Addressed This Visit    Paroxysmal atrial fibrillation ([place])    Saw afib clinic, continues cardizem CD daily with extra dilt IR PRN breakthrough. Now off anticoagulation due to low stroke risk. Sounded irregular today - check EKG - sinus rhythm.       Relevant Orders   EKG 12-Lead (Completed)   Liver cirrhosis secondary to NASH (nonalcoholic steatohepatitis) ([place])    Stable abd [place] 09/2018. Continue to monitor.       Hyperglycemia    A1c reassuring. Encourage limiting  added sugars. Consider fructosamine next labs.       Hyperbilirubinemia    Chronic. Anticipate related to known cirrhosis.       Health maintenance examination - Primary    Preventative protocols reviewed and updated unless pt declined. Discussed healthy diet and lifestyle.       Dyslipidemia    Chronic, stable off meds. The 10-year ASCVD risk score Mikey Bussing DC Brooke Bonito., et al., 2013) is: 27.5%   Values used to calculate the score:     Age: 36 years     Sex: Male     Is Non-Hispanic African American: No     Diabetic: No     Tobacco smoker: No     Systolic Blood Pressure: [id number] mmHg     Is BP treated: Yes     HDL Cholesterol: 40.4 mg/dL     Total Cholesterol: 195  mg/dL       Depression    Stable period off SSRI.       Advanced directives, counseling/discussion    Advanced directives: has at home. HCPOA is wife.Will bring me copy.       Other Visit Diagnoses    Need for influenza vaccination       Relevant Orders   Flu Vaccine QUAD High Dose(Fluad) (Completed)       No orders of the defined types were placed in this encounter.  Orders Placed This Encounter  Procedures  . Flu Vaccine QUAD High Dose(Fluad)  . EKG 12-Lead    Patient instructions: EKG today.  Flu shot today.  We will sign you up for cologuard.  If interested, check with pharmacy about new 2 shot shingles series (shingrix).  Bring me copy of your advanced directives.  Good to see you today, call us with questions Return as needed or in 1 year for next physical.  Follow up plan: Return in about 1 year (around 08/11/2020), or if symptoms worsen or fail to improve, for annual exam, prior fasting for blood work, medicare wellness visit.  Ria Bush, MD

## 2019-08-12 NOTE — Assessment & Plan Note (Signed)
Advanced directives: has at home. HCPOA is wife. Will bring me copy.  

## 2019-09-10 DIAGNOSIS — Z1211 Encounter for screening for malignant neoplasm of colon: Secondary | ICD-10-CM | POA: Diagnosis not present

## 2019-09-10 DIAGNOSIS — Z1212 Encounter for screening for malignant neoplasm of rectum: Secondary | ICD-10-CM | POA: Diagnosis not present

## 2019-09-10 LAB — COLOGUARD: Cologuard: NEGATIVE

## 2019-09-19 ENCOUNTER — Encounter: Payer: Self-pay | Admitting: Family Medicine

## 2019-09-19 ENCOUNTER — Telehealth: Payer: Self-pay

## 2019-09-19 NOTE — Telephone Encounter (Signed)
Spoke with pt notifying him the Cologuard results are negative and will repeat in 3 yrs.  Verbalizes understanding.

## 2019-10-15 DIAGNOSIS — D1801 Hemangioma of skin and subcutaneous tissue: Secondary | ICD-10-CM | POA: Diagnosis not present

## 2019-10-15 DIAGNOSIS — L219 Seborrheic dermatitis, unspecified: Secondary | ICD-10-CM | POA: Diagnosis not present

## 2019-10-15 DIAGNOSIS — B360 Pityriasis versicolor: Secondary | ICD-10-CM | POA: Diagnosis not present

## 2019-10-15 DIAGNOSIS — L820 Inflamed seborrheic keratosis: Secondary | ICD-10-CM | POA: Diagnosis not present

## 2019-10-15 DIAGNOSIS — L570 Actinic keratosis: Secondary | ICD-10-CM | POA: Diagnosis not present

## 2020-01-24 IMAGING — US US ABDOMEN LIMITED
1 series · 14 of 25 positions shown · non-contrast
Comparison: 04/19/2016 ultrasound.

CLINICAL DATA: 72-year-old male. Cirrhosis secondary to [place]. Prior
cholecystectomy. Subsequent encounter.

EXAM:
ULTRASOUND ABDOMEN LIMITED RIGHT UPPER QUADRANT

[Series 1: us abdomen limited · 0.23mm/px · 14 of 44 slices shown]
[im 1/44]
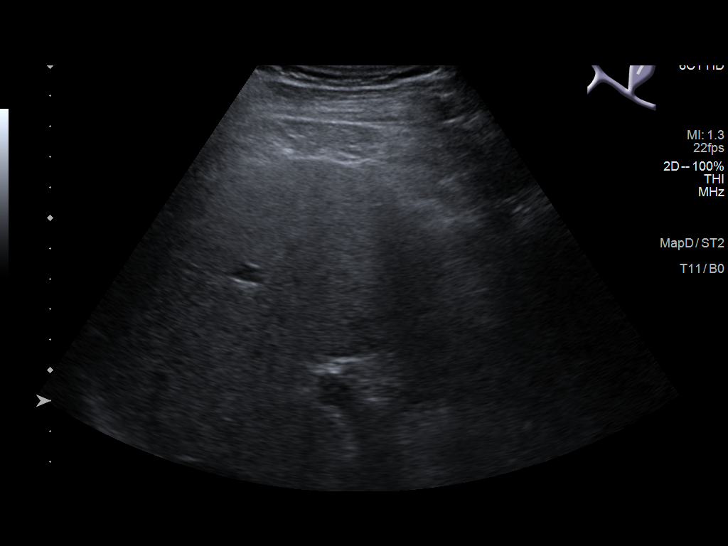
[im 4/44]
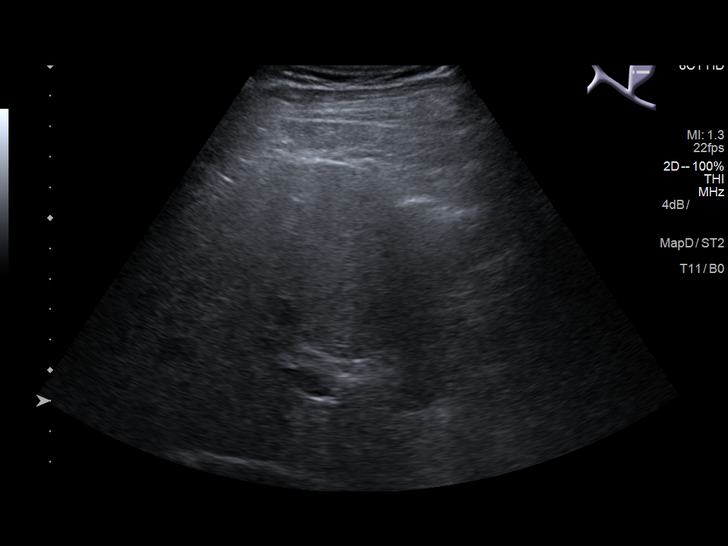
[im 8/44]
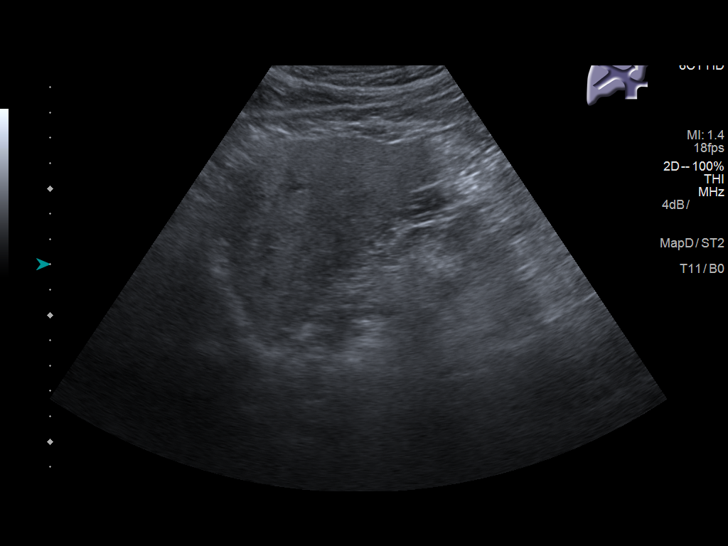
[im 11/44]
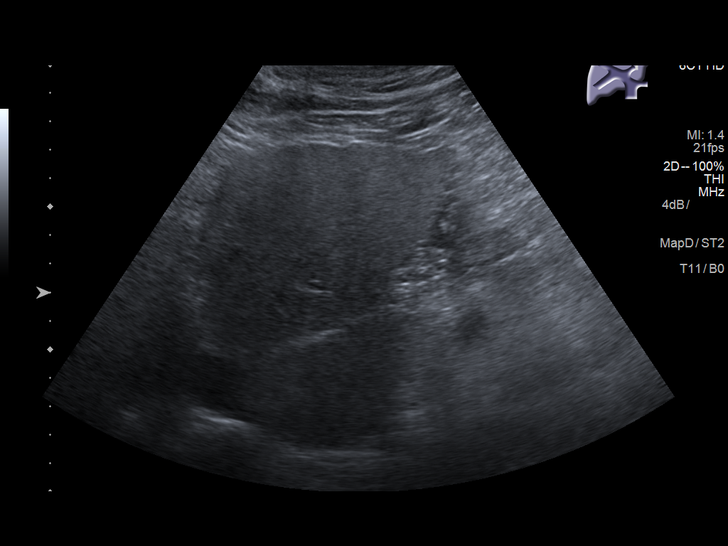
[im 15/44]
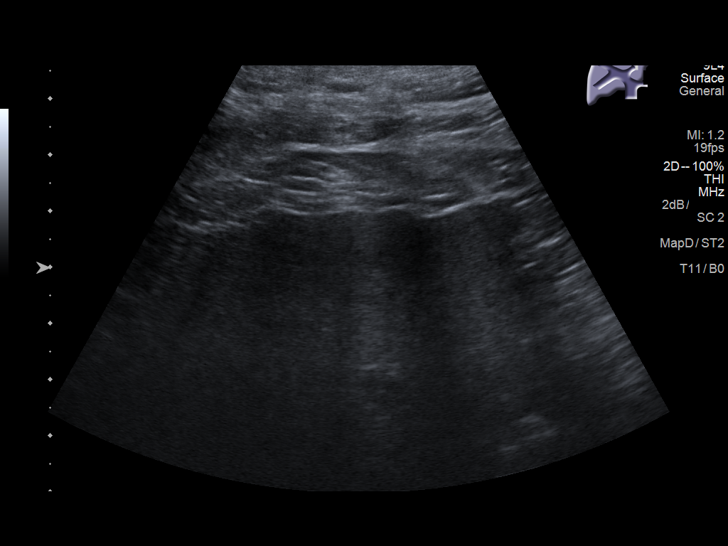
[im 17/44]
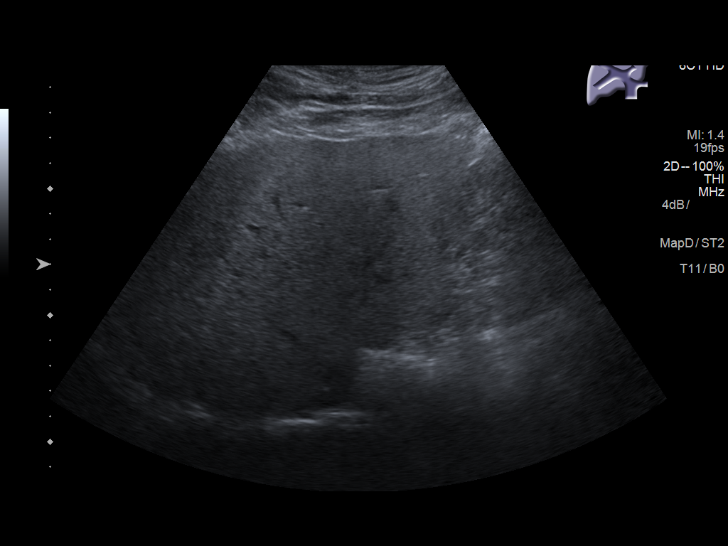
[im 20/44]
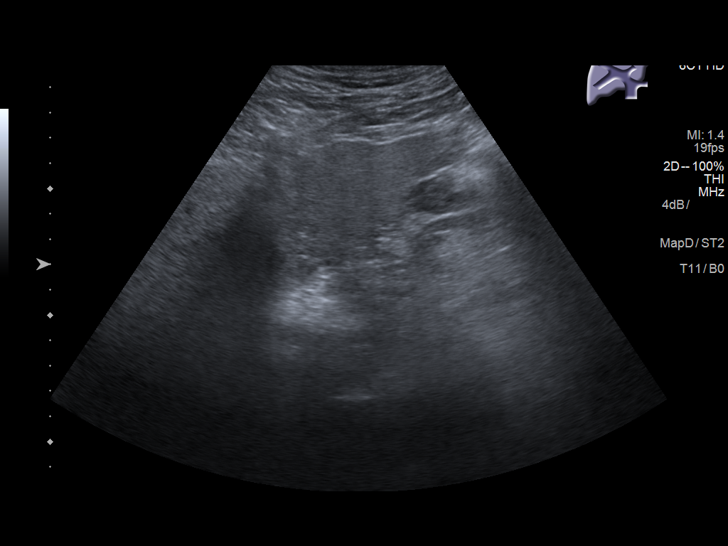
[im 24/44]
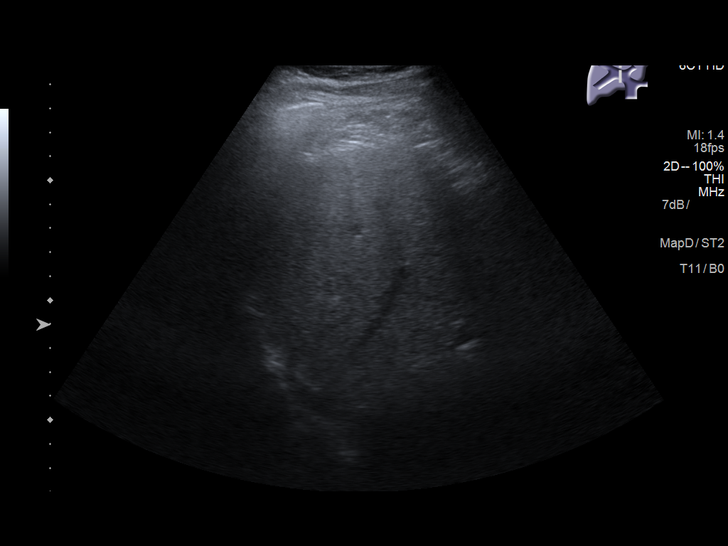
[im 27/44]
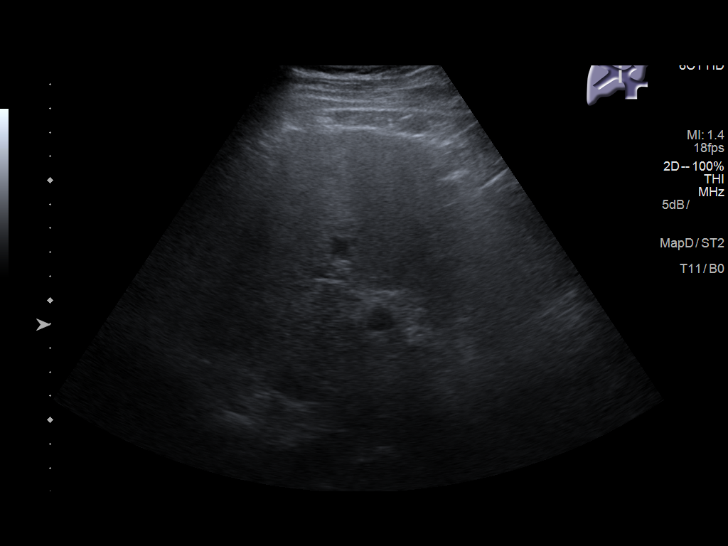
[im 29/44]
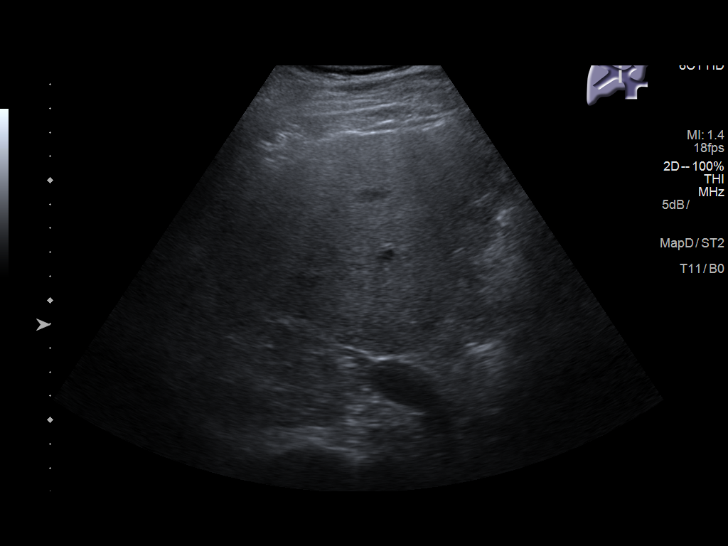
[im 33/44]
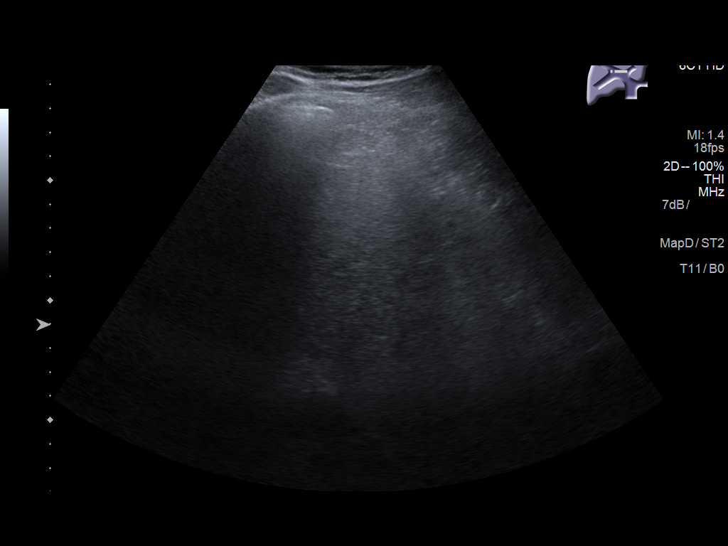
[im 36/44]
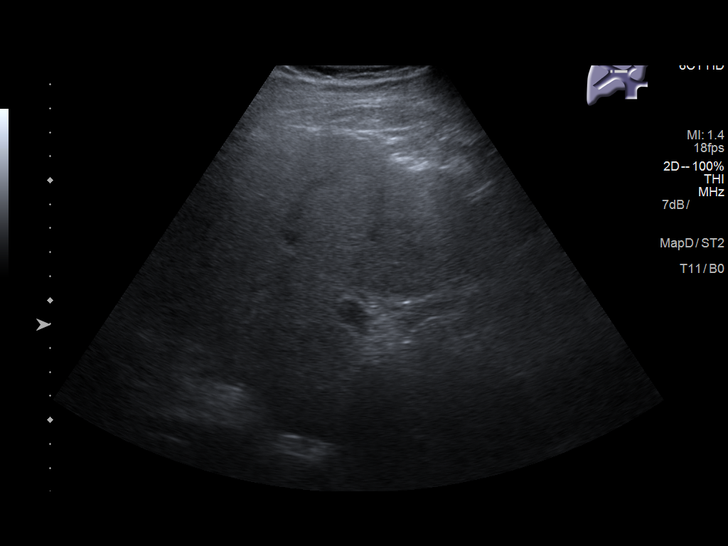
[im 40/44]
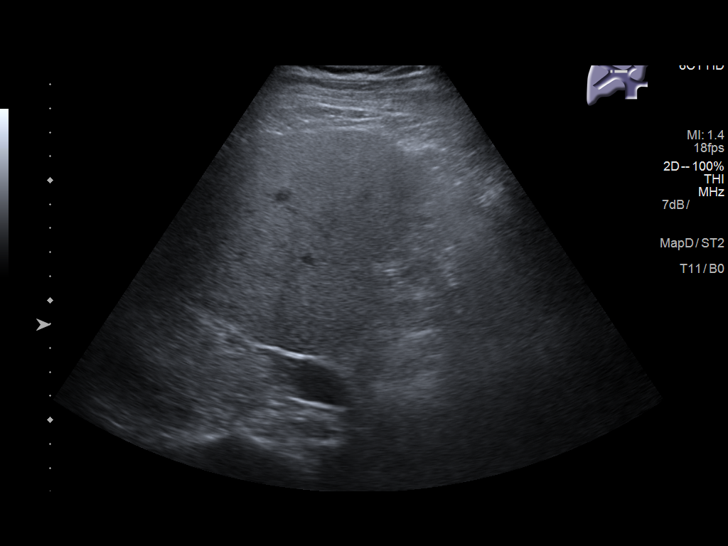
[im 44/44]
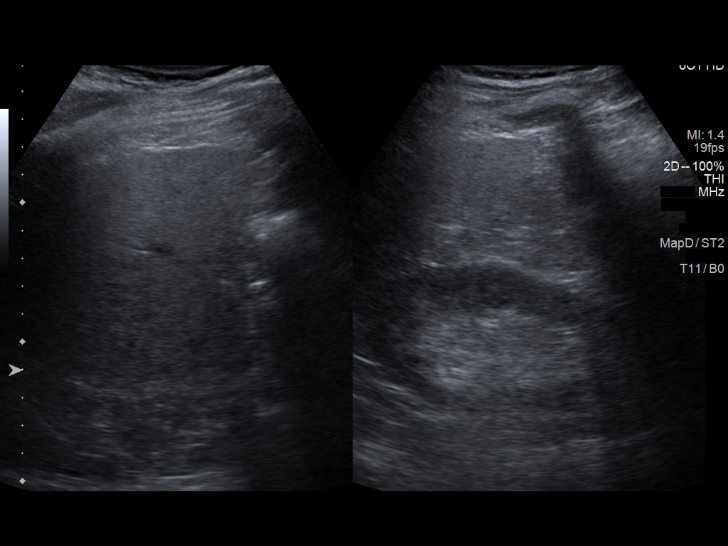

[14 of 25 positions shown; findings below may reference images not displayed]

FINDINGS: Gallbladder:

Post cholecystectomy.

Common bile duct:

Diameter: 3 mm.

Liver:

Diffuse increased heterogeneous echotexture with nodular contour
consistent with history of cirrhosis. Slightly difficult to
penetrate the liver. No mass identified. Portal vein is patent on
color Doppler imaging with normal direction of blood flow towards
the liver.
IMPRESSION: 1. Cirrhotic appearing liver. Slightly difficult to penetrate the
liver. No obvious mass.
2. Post cholecystectomy.

## 2020-03-03 DIAGNOSIS — N5201 Erectile dysfunction due to arterial insufficiency: Secondary | ICD-10-CM | POA: Diagnosis not present

## 2020-03-03 DIAGNOSIS — N401 Enlarged prostate with lower urinary tract symptoms: Secondary | ICD-10-CM | POA: Diagnosis not present

## 2020-03-03 DIAGNOSIS — Z125 Encounter for screening for malignant neoplasm of prostate: Secondary | ICD-10-CM | POA: Diagnosis not present

## 2020-04-13 ENCOUNTER — Other Ambulatory Visit: Payer: Self-pay

## 2020-04-13 ENCOUNTER — Encounter: Payer: Self-pay | Admitting: Dermatology

## 2020-04-13 ENCOUNTER — Ambulatory Visit: Payer: Medicare PPO | Admitting: Dermatology

## 2020-04-13 DIAGNOSIS — L821 Other seborrheic keratosis: Secondary | ICD-10-CM | POA: Diagnosis not present

## 2020-04-13 DIAGNOSIS — L570 Actinic keratosis: Secondary | ICD-10-CM | POA: Diagnosis not present

## 2020-04-13 DIAGNOSIS — L578 Other skin changes due to chronic exposure to nonionizing radiation: Secondary | ICD-10-CM | POA: Diagnosis not present

## 2020-04-13 DIAGNOSIS — S50852A Superficial foreign body of left forearm, initial encounter: Secondary | ICD-10-CM

## 2020-04-13 DIAGNOSIS — L814 Other melanin hyperpigmentation: Secondary | ICD-10-CM | POA: Diagnosis not present

## 2020-04-13 DIAGNOSIS — L219 Seborrheic dermatitis, unspecified: Secondary | ICD-10-CM

## 2020-04-13 DIAGNOSIS — T148XXA Other injury of unspecified body region, initial encounter: Secondary | ICD-10-CM

## 2020-04-13 DIAGNOSIS — D1800 Hemangioma unspecified site: Secondary | ICD-10-CM | POA: Diagnosis not present

## 2020-04-13 NOTE — Patient Instructions (Addendum)
Pre-Operative Instructions  You are scheduled for a surgical procedure at [facility]. We recommend you read the following instructions. If you have any questions or concerns, please call the office at [phone].  1. Shower and wash the entire body with soap and water the day of your surgery paying special attention to cleansing at and around the planned surgery site.  2. Avoid aspirin or aspirin containing products at least fourteen (14) days prior to your surgical procedure and for at least one week (7 Days) after your surgical procedure. If you take aspirin on a regular basis for heart disease or history of stroke or for any other reason, we may recommend you continue taking aspirin but please notify us if you take this on a regular basis. Aspirin can cause more bleeding to occur during surgery as well as prolonged bleeding and bruising after surgery.   3. Avoid other nonsteroidal pain medications at least one week prior to surgery and at least one week prior to your surgery. These include medications such as Ibuprofen (Motrin, Advil and Nuprin), Naprosyn, Voltaren, Relafen, etc. If medications are used for therapeutic reasons, please inform us as they can cause increased bleeding or prolonged bleeding during and bruising after surgical procedures.   4. Please advice [place] if you are taking any "blood thinner" medications such as Coumadin or Dipyridamole or Plavix or similar medications. These cause increased bleeding and prolonged bleeding during and bruising after surgical procedures. We may have to consider discontinuing these medications briefly prior to and shortly after your surgery, if safe to do so.   5. Please inform us of all medications you are currently taking. All medications that are taken regularly should be taken the day of surgery as you always do. Nevertheless, we need to be informed of what medications you are taking prior to surgery to whether they will affect the  procedure or cause any complications.   6. Please inform us of any medication allergies. Also inform us of whether you have allergies to Latex or rubber products or whether you have had any adverse reaction to Lidocaine or Epinephrine.  7. Please inform us of any prosthetic or artificial body parts such as artificial heart valve, joint replacements, etc., or similar condition that might require preoperative antibiotics.   8. We recommend avoidance of alcohol at least two weeks prior to surgery and continued avoidence for at least two weeks after surgery.   9. We recommend discontinuation of tobacco smoking at least two weeks prior to surgery and continued abstinence for at least two weeks after surgery.  10. Do not plan strenuous exercise, strenuous work or strenuous lifting for approximately four weeks after your surgery.   11. We request if you are unable to make your scheduled surgical appointment, please call us at least a week in advance or as soon as you are aware of a problem sot aht we can cancel or reschedule you.   12. You MAKE TAKE TYLENOL (acetaminophen) for pain as it is not a blood thinner.   13. PLEASE PLAN TO BE IN TOWN FOR TWO WEEKS FOLLOWING SURGERY, THIS IS IMPORTANT SO YOU CAN BE CHECKED FOR DRESSING CHANGES, SUTURE REMOVAL AND TO MONITOR FOR POSSIBLE COMPLICATIONS.

## 2020-04-13 NOTE — Progress Notes (Signed)
   Follow-Up Visit   Subjective  Ernest Bell. is a 74 y.o. male who presents for the following: Actinic Keratosis (77mf/u), Seborrheic Dermatitis (face, 68457m/u HC 2.5% cr prn), and tree limb in arm (L arm, ~457m30m He has splinter in arm that hurts that he wants removed.   The following portions of the chart were reviewed this encounter and updated as appropriate:      Review of Systems:  No other skin or systemic complaints except as noted in HPI or Assessment and Plan.  Objective  Well appearing patient in no apparent distress; mood and affect are within normal limits.  All skin waist up examined.  Objective  R upper occipital scalp x 2, L temple x 1 (3): Pink scaly macules   Objective  L nasal dorsum: 4.0mm457mown macule  Objective  Face: Face clear today  Objective  Left Forearm - Posterior: Linear firm nodule 1.5cm, h/o trauma with splinter, bothersome   Assessment & Plan   Seborrheic Keratoses - Stuck-on, waxy, tan-brown papules and plaques  - Discussed benign etiology and prognosis. - Observe - Call for any changes  Hemangiomas - Red papules - Discussed benign nature - Observe - Call for any changes  Actinic Damage - diffuse scaly erythematous macules with underlying dyspigmentation - Recommend daily broad spectrum sunscreen SPF 30+ to sun-exposed areas, reapply every 2 hours as needed.  - Call for new or changing lesions.   AK (actinic keratosis) (3) R upper occipital scalp x 2, L temple x 1  Destruction of lesion - R upper occipital scalp x 2, L temple x 1  Destruction method: cryotherapy   Informed consent: discussed and consent obtained   Lesion destroyed using liquid nitrogen: Yes   Region frozen until ice ball extended beyond lesion: Yes   Outcome: patient tolerated procedure well with no complications   Post-procedure details: wound care instructions given    Lentigines L nasal dorsum  Benign, observe.    Seborrheic  dermatitis Face  Cont HC 2.5% cr qd prn flares  Foreign body in skin Left Forearm - Posterior  Benign, discussed excising to remove foreign body, will plan surgery  Return for removal of foreign body of L arm, and 57m A49m/u.   I, SonyaOthelia Pulling, am acting as scribe for Chesney Suares Brendolyn Patty.  Documentation: I have reviewed the above documentation for accuracy and completeness, and I agree with the above.  Lynda Capistran Brendolyn Patty

## 2020-05-04 ENCOUNTER — Other Ambulatory Visit: Payer: Self-pay

## 2020-05-04 ENCOUNTER — Ambulatory Visit: Payer: Medicare PPO | Admitting: Dermatology

## 2020-05-04 DIAGNOSIS — S50852A Superficial foreign body of left forearm, initial encounter: Secondary | ICD-10-CM | POA: Diagnosis not present

## 2020-05-04 DIAGNOSIS — T148XXA Other injury of unspecified body region, initial encounter: Secondary | ICD-10-CM

## 2020-05-04 NOTE — Patient Instructions (Signed)

## 2020-05-04 NOTE — Progress Notes (Signed)
   Follow-Up Visit   Subjective  Ernest Bell. is a 74 y.o. male who presents for the following: Foreign body (L forearm, pt presents for excision).   The following portions of the chart were reviewed this encounter and updated as appropriate:      Review of Systems:  No other skin or systemic complaints except as noted in HPI or Assessment and Plan.  Objective  Well appearing patient in no apparent distress; mood and affect are within normal limits.  A focused examination was performed including L forearm. Relevant physical exam findings are noted in the Assessment and Plan.  Objective  Left Forearm: 1.5cm firm linear sub q nodule   Assessment & Plan  Foreign body in skin Left Forearm  Skin excision - Left Forearm  Lesion length (cm):  1.5 Lesion width (cm):  1.5 Margin per side (cm):  0 Total excision diameter (cm):  1.5 Informed consent: discussed and consent obtained   Timeout: patient name, date of birth, surgical site, and procedure verified   Procedure prep:  Patient was prepped and draped in usual sterile fashion Prep type:  Povidone-iodine Anesthesia: the lesion was anesthetized in a standard fashion   Anesthesia comment:  11.0cc Anesthetic:  1% lidocaine w/ epinephrine 1-100,000 buffered w/ 8.4% NaHCO3 Instrument used: #15 blade   Hemostasis achieved with: pressure and electrodesiccation   Outcome: patient tolerated procedure well with no complications   Additional details:  Splinter removed, did not send for path  Skin repair - Left Forearm Complexity:  Simple Final length (cm):  1.5 Undermining: edges could be approximated without difficulty   Fine/surface layer approximation (top stitches):  Suture size:  4-0 Suture type: nylon   Stitches: simple interrupted   Suture removal (days):  8 Hemostasis achieved with: suture Outcome: patient tolerated procedure well with no complications   Post-procedure details: sterile dressing applied and wound  care instructions given   Dressing type: pressure dressing (Mupirocin)    Return in about 8 days (around 05/12/2020) for suture removal.  I, Sonya Hupman, RMA, am acting as scribe for Brendolyn Patty, MD . Documentation: I have reviewed the above documentation for accuracy and completeness, and I agree with the above.  Brendolyn Patty MD

## 2020-05-05 ENCOUNTER — Telehealth: Payer: Self-pay

## 2020-05-05 NOTE — Telephone Encounter (Signed)
Lft pt msg to call if any problems after yesterdays surgery.Ernest Bell

## 2020-05-12 ENCOUNTER — Ambulatory Visit (INDEPENDENT_AMBULATORY_CARE_PROVIDER_SITE_OTHER): Payer: Medicare PPO | Admitting: Dermatology

## 2020-05-12 ENCOUNTER — Other Ambulatory Visit: Payer: Self-pay

## 2020-05-12 DIAGNOSIS — Z4802 Encounter for removal of sutures: Secondary | ICD-10-CM

## 2020-05-12 DIAGNOSIS — S50852A Superficial foreign body of left forearm, initial encounter: Secondary | ICD-10-CM

## 2020-05-12 DIAGNOSIS — T148XXA Other injury of unspecified body region, initial encounter: Secondary | ICD-10-CM

## 2020-05-12 NOTE — Progress Notes (Signed)
   Follow-Up Visit   Subjective  Ernest Bell. is a 74 y.o. male who presents for the following: Follow-up (Patient here today for suture removal. ).  Splinter removed last week.  The following portions of the chart were reviewed this encounter and updated as appropriate:      Review of Systems:  No other skin or systemic complaints except as noted in HPI or Assessment and Plan.  Objective  Well appearing patient in no apparent distress; mood and affect are within normal limits.  A focused examination was performed including left forearm. Relevant physical exam findings are noted in the Assessment and Plan.    Assessment & Plan    Encounter for Removal of Sutures - Incision site at the left forearm is clean, dry and intact - Wound cleansed, sutures removed, wound cleansed and steri strips applied.  - No pathology was sent, foreign body (splinter) was excised.  - Patient advised to keep steri-strips dry until they fall off. - Scars remodel for a full year. - Once steri-strips fall off, patient can apply over-the-counter silicone scar cream each night to help with scar remodeling if desired. - Patient advised to call with any concerns or if they notice any new or changing lesions.   Return if symptoms worsen or fail to improve.  Graciella Belton, RMA, am acting as scribe for Brendolyn Patty, MD . Documentation: I have reviewed the above documentation for accuracy and completeness, and I agree with the above.  Brendolyn Patty MD

## 2020-06-19 ENCOUNTER — Other Ambulatory Visit (HOSPITAL_COMMUNITY): Payer: Self-pay | Admitting: Nurse Practitioner

## 2020-06-19 DIAGNOSIS — S8265XA Nondisplaced fracture of lateral malleolus of left fibula, initial encounter for closed fracture: Secondary | ICD-10-CM | POA: Diagnosis not present

## 2020-06-23 DIAGNOSIS — S8265XD Nondisplaced fracture of lateral malleolus of left fibula, subsequent encounter for closed fracture with routine healing: Secondary | ICD-10-CM | POA: Diagnosis not present

## 2020-07-06 DIAGNOSIS — S8265XD Nondisplaced fracture of lateral malleolus of left fibula, subsequent encounter for closed fracture with routine healing: Secondary | ICD-10-CM | POA: Diagnosis not present

## 2020-07-20 ENCOUNTER — Other Ambulatory Visit (HOSPITAL_COMMUNITY): Payer: Self-pay | Admitting: Nurse Practitioner

## 2020-07-20 DIAGNOSIS — S8265XD Nondisplaced fracture of lateral malleolus of left fibula, subsequent encounter for closed fracture with routine healing: Secondary | ICD-10-CM | POA: Diagnosis not present

## 2020-07-22 ENCOUNTER — Other Ambulatory Visit (HOSPITAL_COMMUNITY): Payer: Self-pay | Admitting: Internal Medicine

## 2020-08-03 ENCOUNTER — Ambulatory Visit: Payer: Medicare PPO | Admitting: Internal Medicine

## 2020-08-03 ENCOUNTER — Other Ambulatory Visit: Payer: Self-pay

## 2020-08-03 ENCOUNTER — Encounter: Payer: Self-pay | Admitting: Internal Medicine

## 2020-08-03 VITALS — BP 132/74 | HR 69 | Ht 65.0 in | Wt 149.0 lb

## 2020-08-03 DIAGNOSIS — I480 Paroxysmal atrial fibrillation: Secondary | ICD-10-CM

## 2020-08-03 MED ORDER — DILTIAZEM HCL ER COATED BEADS 120 MG PO CP24: 120.0000 mg | ORAL_CAPSULE | ORAL | 2 refills | Status: DC | PRN

## 2020-08-03 NOTE — Progress Notes (Signed)
PCP: Ria Bush, MD   Primary EP: Dr Doristine Mango Jaquavious Mercer. is a 74 y.o. male who presents today for routine electrophysiology followup.  Since last being seen in our clinic, the patient reports doing very well.  Today, he denies symptoms of palpitations, chest pain, shortness of breath,  lower extremity edema, dizziness, presyncope, or syncope.  The patient is otherwise without complaint today.   Past Medical History:  Diagnosis Date  . Actinic keratosis   . Atrial fib/flutter, transient 04/28/10   typical appearing atrial flutter dx 04/28/10 s/p CTI ablation 10/11  . BPH (benign prostatic hyperplasia)    PSA stable, followed by Dr. Eliberto Ivory  . CHF (congestive heart failure) ([place])   . 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 Coronary artery disease   . Depression    on sertraline  . DJD (degenerative joint disease)   . Gout   . Liver cirrhosis secondary to NASH (nonalcoholic steatohepatitis) ([place]) 2001   ?by liver biopsy  . Persistent atrial fibrillation ([place]) 09/15/10   S/P PVI ([facility]ry[facility]) at [facility]  . Syncope 1997   recurrent S/P tilt study by Dr. [name] (like due to dysautonomia)   Past Surgical History:  Procedure Laterality Date  . afib abation  09/15/10   Cryo at [place], CTI ablation at [facility] 04/28/10  . CHOLECYSTECTOMY  1995  . COLONOSCOPY  02/2000   2 hyperplastic polyps, [name]  . ESOPHAGOGASTRODUODENOSCOPY  02/2000   [name]  . HEMORROIDECTOMY  2007  . KNEE SURGERY  2007   he had osteoarthritis with left knee surgery  . PERICARDIECTOMY  1969   at [facility]  . right thumb surger     for osteoarthritis    ROS- all systems are reviewed and negatives except as per HPI above  Current Outpatient Medications  Medication Sig Dispense Refill  . diltiazem (CARDIZEM CD) 120 MG 24 hr capsule Take 1 capsule (120 mg total) by mouth daily. Please make overdue appt with Dr. Rayann Heman before anymore refills. Thank  you 2nd attempt 15 capsule 0  . ibuprofen (ADVIL,MOTRIN) 800 MG tablet Take 1 tablet by mouth every 8 (eight) hours as needed for pain.  1  . Tamsulosin HCl (FLOMAX) 0.4 MG CAPS Take 0.4 mg by mouth 2 (two) times daily.      No current facility-administered medications for this visit.    Physical Exam: Vitals:   08/03/20 1341  BP: 132/74  Pulse: 69  SpO2: 96%  Weight: 149 lb (67.6 kg)  Height: 5' 5"  (1.651 m)    GEN- The patient is well appearing, alert and oriented x 3 today.   Head- normocephalic, atraumatic Eyes-  Sclera clear, conjunctiva pink Ears- hearing intact Oropharynx- clear Lungs- Clear to ausculation bilaterally, normal work of breathing Heart- Regular rate and rhythm, no murmurs, rubs or gallops, PMI not laterally displaced GI- soft, NT, ND, + BS Extremities- no clubbing, cyanosis, or edema  Wt Readings from Last 3 Encounters:  08/03/20 149 lb (67.6 kg)  08/12/19 168 lb (76.2 kg)  06/19/19 172 lb 9.6 oz (78.3 kg)   Echo 05/28/19 reviewed  EKG tracing ordered today is personally reviewed and shows sinus with PACs  Assessment and Plan:  1. paroxysmal atrial fibrillation Well controlled chads2vasc score is 1.   He wishes to stop taking daily diltiazem As he is doing ok this is not unreasonable.  If he has further afib, he has diltiazem 29m which he  can take prn  Risks, benefits and potential toxicities for medications prescribed and/or refilled reviewed with patient today.   Return in 12 months to see EP NP  [name] MD, [facility] 08/03/2020 1:59 PM

## 2020-08-03 NOTE — Patient Instructions (Addendum)
Medication Instructions:  Change your Diltiazem to as need for heart rate over 100  *If you need a refill on your cardiac medications before your next appointment, please call your pharmacy*  Lab Work: None ordered.  If you have labs (blood work) drawn today and your tests are completely normal, you will receive your results only by: Marland Kitchen MyChart Message (if you have MyChart) OR . A paper copy in the mail If you have any lab test that is abnormal or we need to change your treatment, we will call you to review the results.  Testing/Procedures: None ordered.  Follow-Up: At [facility], you and your health needs are our priority.  As part of our continuing mission to provide you with exceptional heart care, we have created designated Provider Care Teams.  These Care Teams include your primary Cardiologist (physician) and Advanced Practice Providers (APPs -  Physician Assistants and Nurse Practitioners) who all work together to provide you with the care you need, when you need it.  We recommend signing up for the patient portal called "MyChart".  Sign up information is provided on this After Visit Summary.  MyChart is used to connect with patients for Virtual Visits (Telemedicine).  Patients are able to view lab/test results, encounter notes, upcoming appointments, etc.  Non-urgent messages can be sent to your provider as well.   To learn more about what you can do with MyChart, go to [email or web address].    Your next appointment:   Your physician wants you to follow-up in: 1 year with Chanetta Marshall.  You will receive a reminder letter in the mail two months in advance. If you don't receive a letter, please call our office to schedule the follow-up appointment.    Other Instructions:

## 2020-08-08 ENCOUNTER — Other Ambulatory Visit: Payer: Self-pay | Admitting: Family Medicine

## 2020-08-08 DIAGNOSIS — K7460 Unspecified cirrhosis of liver: Secondary | ICD-10-CM

## 2020-08-08 DIAGNOSIS — R739 Hyperglycemia, unspecified: Secondary | ICD-10-CM

## 2020-08-08 DIAGNOSIS — E785 Hyperlipidemia, unspecified: Secondary | ICD-10-CM

## 2020-08-08 DIAGNOSIS — I480 Paroxysmal atrial fibrillation: Secondary | ICD-10-CM

## 2020-08-08 DIAGNOSIS — Z125 Encounter for screening for malignant neoplasm of prostate: Secondary | ICD-10-CM

## 2020-08-10 ENCOUNTER — Other Ambulatory Visit: Payer: Medicare PPO

## 2020-08-10 ENCOUNTER — Other Ambulatory Visit: Payer: Self-pay

## 2020-08-11 ENCOUNTER — Other Ambulatory Visit: Payer: Medicare Other

## 2020-08-11 ENCOUNTER — Ambulatory Visit: Payer: Medicare Other

## 2020-08-14 ENCOUNTER — Encounter: Payer: Medicare Other | Admitting: Family Medicine

## 2020-08-19 DIAGNOSIS — S8265XD Nondisplaced fracture of lateral malleolus of left fibula, subsequent encounter for closed fracture with routine healing: Secondary | ICD-10-CM | POA: Diagnosis not present

## 2020-08-19 DIAGNOSIS — S8265XA Nondisplaced fracture of lateral malleolus of left fibula, initial encounter for closed fracture: Secondary | ICD-10-CM | POA: Diagnosis not present

## 2020-08-27 ENCOUNTER — Ambulatory Visit: Payer: Medicare PPO | Admitting: Internal Medicine

## 2020-09-07 DIAGNOSIS — S8263XA Displaced fracture of lateral malleolus of unspecified fibula, initial encounter for closed fracture: Secondary | ICD-10-CM | POA: Insufficient documentation

## 2020-09-18 DIAGNOSIS — Z20822 Contact with and (suspected) exposure to covid-19: Secondary | ICD-10-CM | POA: Diagnosis not present

## 2020-09-18 DIAGNOSIS — Z03818 Encounter for observation for suspected exposure to other biological agents ruled out: Secondary | ICD-10-CM | POA: Diagnosis not present

## 2020-09-29 ENCOUNTER — Other Ambulatory Visit: Payer: Medicare PPO

## 2020-09-29 ENCOUNTER — Ambulatory Visit: Payer: Medicare PPO

## 2020-09-29 ENCOUNTER — Telehealth: Payer: Self-pay

## 2020-09-29 NOTE — Telephone Encounter (Signed)
Called patient 3 times trying to complete his AWV. Patient never answered phone. Left message notifying patient I called and he can call to reschedule or provider might complete at his physical next week. Appointment was cancelled.

## 2020-10-06 ENCOUNTER — Other Ambulatory Visit: Payer: Medicare PPO

## 2020-10-07 ENCOUNTER — Encounter: Payer: Medicare PPO | Admitting: Family Medicine

## 2020-10-13 ENCOUNTER — Ambulatory Visit: Payer: Medicare PPO | Admitting: Dermatology

## 2020-11-11 ENCOUNTER — Ambulatory Visit: Payer: Medicare PPO | Admitting: Dermatology

## 2020-11-23 ENCOUNTER — Other Ambulatory Visit: Payer: Medicare PPO

## 2020-11-23 ENCOUNTER — Telehealth: Payer: Self-pay

## 2020-11-23 NOTE — Telephone Encounter (Signed)
11/23/20 lab appt already cancelled.

## 2020-11-23 NOTE — Telephone Encounter (Signed)
[place] Night - Client Nonclinical Telephone Record AccessNurse Client [place] Night - Client Client Site [place] Physician Ria Bush - MD Contact Type Call Who Is Calling Patient / Member / Family / Caregiver Caller Name [place] Phone Number [phone] Patient Name Ernest Bell Patient DOB [birth year] Call Type Message Only Information Provided Reason for Call Request to [facility] Appointment Initial Comment Caller states he is needing to cancel his appointment. Additional Comment A message was sent. Disp. Time Disposition Final User 11/22/2020 9:02:48 AM General Information Provided Yes Shann Medal Call Closed By: Shann Medal Transaction Date/Time: 11/22/2020 9:00:53 AM (ET)

## 2020-11-24 ENCOUNTER — Other Ambulatory Visit: Payer: Self-pay

## 2020-11-24 ENCOUNTER — Ambulatory Visit (INDEPENDENT_AMBULATORY_CARE_PROVIDER_SITE_OTHER): Payer: Medicare PPO

## 2020-11-24 DIAGNOSIS — Z0000 Encounter for general adult medical examination without abnormal findings: Secondary | ICD-10-CM

## 2020-11-24 NOTE — Progress Notes (Signed)
Subjective:   Ernest Bell. is a 75 y.o. male who presents for Medicare Annual/Subsequent preventive examination.  Review of Systems: N/A      I connected with the patient today by telephone and verified that I am speaking with the correct person using two identifiers. Location patient: home Location nurse: work Persons participating in the telephone visit: patient, nurse.   I discussed the limitations, risks, security and privacy concerns of performing an evaluation and management service by telephone and the availability of in person appointments. I also discussed with the patient that there may be a patient responsible charge related to this service. The patient expressed understanding and verbally consented to this telephonic visit.        Cardiac Risk Factors include: advanced age (>20mn, >>34women);male gender;dyslipidemia     Objective:    Today's Vitals   There is no height or weight on file to calculate BMI.  Advanced Directives 11/24/2020 07/31/2019 07/16/2018 07/07/2017 04/15/2016  Does Patient Have a Medical Advance Directive? Yes Yes Yes Yes Yes  Type of AParamedicof A[place]sideLiving will HH[place]sLiving will [place]Living will [place]land ParkLiving will [place]am[place]Living will  Does patient want to make changes to medical advance directive? - - - - No - Patient declined  Copy of H[place] Chart? No - copy requested No - copy requested No - copy requested No - copy requested No - copy requested    Current Medications (verified) Outpatient Encounter Medications as of 11/24/2020  Medication Sig  . diltiazem (CARDIZEM CD) 120 MG 24 hr capsule Take 1 capsule (120 mg total) by mouth as needed (As need for heart rate > 100). Please make overdue appt with Dr. ARayann Hemanbefore anymore refills. Thank you 2nd attempt  . ibuprofen (ADVIL,MOTRIN) 800 MG tablet  Take 1 tablet by mouth every 8 (eight) hours as needed for pain.  . tamsulosin (FLOMAX) 0.4 MG CAPS capsule Take 0.4 mg by mouth 2 (two) times daily.   No facility-administered encounter medications on file as of 11/24/2020.    Allergies (verified) Penicillins   History: Past Medical History:  Diagnosis Date  . Actinic keratosis   . Atrial fib/flutter, transient 04/28/10   typical appearing atrial flutter dx 04/28/10 s/p CTI ablation 10/11  . BPH (benign prostatic hyperplasia)    PSA stable, followed by Dr. [name] . CHF (congestive heart failure) ([place]   . 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 Coronary artery disease   . Depression    on sertraline  . DJD (degenerative joint disease)   . Gout   . Liver cirrhosis secondary to NASH (nonalcoholic steatohepatitis) ([place] 2001   ?by liver biopsy  . [place]istent atrial fibrillation ([place] 09/15/10   [facility] . Syncope 1997   recurrent S/P tilt study by Dr. KCaryl Comes(like due to dysautonomia)   Past Surgical History:  Procedure Laterality Date  . afib abation  09/15/10   [place] [facility]  . CHOLECYSTECTOMY  1995  . COLONOSCOPY  02/2000   2 hyperplastic polyps, IBS ([name]  . ESOPHAGOGASTRODUODENOSCOPY  02/2000   HH ([name]  . HEMORROIDECTOMY  2007  . KNEE SURGERY  2007   he had osteoarthritis with left knee surgery  . [facility]ger  for osteoarthritis   Family History  Problem Relation Age of Onset  . Hypertension Mother   . Alcohol abuse Father        smoker  . Heart disease Father        MI (smoker)   Social History   Socioeconomic History  . Marital status: Married    Spouse name: Not on file  . Number of children: Not on file  . Years of education: Not on file  . Highest education level: Not on file  Occupational History  . Occupation: Disabled Community education officer: UNEMPLOYED    Comment: previously worked as a Engineer, manufacturing  . Smoking status: Never Smoker  . Smokeless tobacco: Never Used  Vaping Use  . Vaping Use: Never used  Substance and Sexual Activity  . Alcohol use: No  . Drug use: No  . Sexual activity: Yes  Other Topics Concern  . 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   Diet: good water, fruits/vegetables daily   Social Determinants of Health   Financial Resource Strain: Low Risk   . Difficulty of Paying Living Expenses: Not hard at all  Food Insecurity: No Food Insecurity  . Worried About Charity fundraiser in the Last Year: Never true  . Ran Out of Food in the Last Year: Never true  Transportation Needs: No Transportation Needs  . Lack of Transportation (Medical): No  . Lack of Transportation (Non-Medical): No  Physical Activity: Sufficiently Active  . Days of Exercise per Week: 3 days  . Minutes of Exercise per Session: 150+ min  Stress: No Stress Concern Present  . Feeling of Stress : Not at all  Social Connections: Not on file    Tobacco Counseling Counseling given: Not Answered   Clinical Intake:  Pre-visit preparation completed: Yes  Pain : No/denies pain     Nutritional Risks: None Diabetes: No  How often do you need to have someone help you when you read instructions, pamphlets, or other written materials from your doctor or pharmacy?: 1 - Never What is the last grade level you completed in school?: Masters  Diabetic: No Nutrition Risk Assessment:  Has the patient had any N/V/D within the last 2 months?  No  Does the patient have any non-healing wounds?  No  Has the patient had any unintentional weight loss or weight gain?  No   Diabetes:  Is the patient diabetic?  No  If diabetic, was a  CBG obtained today?  N/A Did the patient bring in their glucometer from home?  N/A How often do you monitor your CBG's? N/A.   Financial Strains and Diabetes Management:  Are you having any financial strains with the device, your supplies or your medication? N/A.  Does the patient want to be seen by Chronic Care Management for management of their diabetes?  N/A Would the patient like to be referred to a Nutritionist or for Diabetic Management?  N/A  Interpreter Needed?: No  Information entered by :: CJohnson, LPN   Activities of Daily Living In your present state of health, do you have any difficulty performing the following activities: 11/24/2020  Hearing? N  Vision? N  Difficulty concentrating or making decisions? N  Walking or climbing stairs?  N  Dressing or bathing? N  Doing errands, shopping? N  Preparing Food and eating ? N  Using the Toilet? N  In the past six months, have you accidently leaked urine? N  Do you have problems with loss of bowel control? N  Managing your Medications? N  Managing your Finances? N  Housekeeping or managing your Housekeeping? N  Some recent data might be hidden    Patient Care Team: Ria Bush, MD as PCP - General (Family Medicine) [name], MD as Consulting Physician (Cardiology) Royston Cowper, MD as Consulting Physician (Urology)  Indicate any recent Medical Services you may have received from other than [facility] providers in the past year (date may be approximate).     Assessment:   This is a routine wellness examination for Abem.  Hearing/Vision screen  Hearing Screening   125Hz  250Hz  500Hz  1000Hz  2000Hz  3000Hz  4000Hz  6000Hz  8000Hz   Right ear:           Left ear:           Vision Screening Comments: Patient gets annual eye exams   Dietary issues and exercise activities discussed: Current Exercise Habits: Home exercise routine, Type of exercise: Other - see comments (tennis), Time (Minutes): > 60, Frequency  (Times/Week): 3, Weekly Exercise (Minutes/Week): 0, Intensity: Moderate, Exercise limited by: None identified  Goals    . Increase physical activity     Starting 07/16/2018, I will continue to exercise for at least 60 minutes daily.     . Patient Stated     07/31/2019, I will maintain and continue medications as prescribed.     . Patient Stated     11/24/2020, I will continue to play tennis 3 times a week for 2-3 hours.      Depression Screen PHQ 2/9 Scores 11/24/2020 07/31/2019 07/16/2018 07/07/2017 04/15/2016 03/03/2014  PHQ - 2 Score 0 0 0 0 0 0  PHQ- 9 Score 0 0 0 0 - -    Fall Risk Fall Risk  11/24/2020 07/31/2019 07/16/2018 07/07/2017 04/15/2016  Falls in the past year? 0 0 0 No No  Number falls in past yr: 0 0 - - -  Injury with Fall? 0 0 - - -  Risk for fall due to : No Fall Risks Medication side effect - - -  Follow up Falls evaluation completed;Falls prevention discussed Falls evaluation completed;Falls prevention discussed - - -    FALL RISK PREVENTION PERTAINING TO THE HOME:  Any stairs in or around the home? Yes  If so, are there any without handrails? No  Home free of loose throw rugs in walkways, pet beds, electrical cords, etc? Yes  Adequate lighting in your home to reduce risk of falls? Yes   ASSISTIVE DEVICES UTILIZED TO PREVENT FALLS:  Life alert? No  Use of a cane, walker or w/c? No  Grab bars in the bathroom? No  Shower chair or bench in shower? No  Elevated toilet seat or a handicapped toilet? No   TIMED UP AND GO:  Was the test performed? N/A telephone visit .  Cognitive Function: MMSE - Mini Mental State Exam 11/24/2020 07/31/2019 07/16/2018 07/07/2017 04/15/2016  Orientation to time 5 5 5 5 5   Orientation to Place 5 5 5 5 5   Registration 3 3 3 3 3   Attention/ Calculation 5 5 0 0 0  Recall 3 3 3 3 3   Language- name 2 objects - - 0 0 0  Language- repeat 1 1 1 1  1  Language- follow 3 step command - - 3 3 3   Language- read & follow direction - - 0 0 0   Write a sentence - - 0 0 0  Copy design - - 0 0 0  Total score - - 20 20 20   Mini Cog  Mini-Cog screen was completed. Maximum score is 22. A value of 0 denotes this part of the MMSE was not completed or the patient failed this part of the Mini-Cog screening.       Immunizations Immunization History  Administered Date(s) Administered  . Fluad Quad(high Dose 65+) 08/12/2019  . Influenza Whole 05/07/2011  . Influenza, Seasonal, Injecte, Preservative Fre 08/18/2015  . Influenza,inj,Quad PF,6+ Mos 06/27/2016, 08/21/2018  . Influenza-Unspecified 06/23/2017, 06/05/2020  . Pneumococcal Conjugate-13 03/03/2014  . Pneumococcal Polysaccharide-23 07/26/2011  . Td 01/26/2010, 03/25/2013  . Zoster 08/18/2015    TDAP status: Up to date  Flu Vaccine status: Up to date  Pneumococcal vaccine status: Up to date  Covid-19 vaccine status: Completed vaccines per patient. Will bring card to physical so dates can be documented in chart  Qualifies for Shingles Vaccine? Yes   Zostavax completed Yes   Shingrix Completed?: No.    Education has been provided regarding the importance of this vaccine. Patient has been advised to call insurance company to determine out of pocket expense if they have not yet received this vaccine. Advised may also receive vaccine at local pharmacy or Health Dept. Verbalized acceptance and understanding.  Screening Tests Health Maintenance  Topic Date Due  . COVID-19 Vaccine (1) Never done  . COLON CANCER SCREENING ANNUAL FOBT  05/06/2020  . Fecal DNA (Cologuard)  09/09/2022  . TETANUS/TDAP  03/26/2023  . INFLUENZA VACCINE  Completed  . Hepatitis C Screening  Completed  . PNA vac Low Risk Adult  Completed  . HPV VACCINES  Aged Out    Health Maintenance  Health Maintenance Due  Topic Date Due  . COVID-19 Vaccine (1) Never done  . COLON CANCER SCREENING ANNUAL FOBT  05/06/2020    Colorectal cancer screening: Type of screening: Cologuard. Completed 09/10/2019.  Repeat every 3 years  Lung Cancer Screening: (Low Dose CT Chest recommended if Age 65-80 years, 30 pack-year currently smoking OR have quit w/in 15years.) does not qualify.    Additional Screening:  Hepatitis C Screening: does qualify; Completed 04/15/2016  Vision Screening: Recommended annual ophthalmology exams for early detection of glaucoma and other disorders of the eye. Is the patient up to date with their annual eye exam?  Yes  Who is the provider or what is the name of the office in which the patient attends annual eye exams? [facility] If pt is not established with a provider, would they like to be referred to a provider to establish care? No .   Dental Screening: Recommended annual dental exams for proper oral hygiene  Community Resource Referral / Chronic Care Management: CRR required this visit?  No   CCM required this visit?  No      Plan:     I have personally reviewed and noted the following in the patient's chart:   . Medical and social history . Use of alcohol, tobacco or illicit drugs  . Current medications and supplements . Functional ability and status . Nutritional status . Physical activity . Advanced directives . List of other physicians . Hospitalizations, surgeries, and ER visits in previous 12 months . Vitals . Screenings to include cognitive, depression, and falls . Referrals and appointments  In addition, I have reviewed and discussed with patient certain preventive protocols, quality metrics, and best practice recommendations. A written personalized care plan for preventive services as well as general preventive health recommendations were provided to patient.   Due to this being a telephonic visit, the after visit summary with patients personalized plan was offered to patient via office or my-chart. Patient preferred to pick up at office at next visit or via mychart.   Andrez Grime, LPN   [id number]

## 2020-11-24 NOTE — Patient Instructions (Signed)
[name] , Thank you for taking time to come for your Medicare Wellness Visit. I appreciate your ongoing commitment to your health goals. Please review the following plan we discussed and let me know if I can assist you in the future.   Screening recommendations/referrals: Colonoscopy: Cologuard completed 09/10/2019, due 09/2022 Recommended yearly ophthalmology/optometry visit for glaucoma screening and checkup Recommended yearly dental visit for hygiene and checkup  Vaccinations: Influenza vaccine: Up to date, completed 06/2020, due 04/2021 Pneumococcal vaccine: Completed series Tdap vaccine: Up to date, completed 03/25/2013, due 03/2023 Shingles vaccine: due, check with your insurance regarding coverage if interested    Covid-19: Completed series, Please bring card with dates to physical so we can document in your chart  Advanced directives: Please bring a copy of your POA (Power of [place]) and/or Living Will to your next appointment.   Conditions/risks identified: dyslipidemia   Next appointment: Follow up in one year for your annual wellness visit.   Preventive Care 59 Years and Older, Male Preventive care refers to lifestyle choices and visits with your health care provider that can promote health and wellness. What does preventive care include?  A yearly physical exam. This is also called an annual well check.  Dental exams once or twice a year.  Routine eye exams. Ask your health care provider how often you should have your eyes checked.  Personal lifestyle choices, including:  Daily care of your teeth and gums.  Regular physical activity.  Eating a healthy diet.  Avoiding tobacco and drug use.  Limiting alcohol use.  Practicing safe sex.  Taking low doses of aspirin every day.  Taking vitamin and mineral supplements as recommended by your health care provider. What happens during an annual well check? The services and screenings done by your health care provider  during your annual well check will depend on your age, overall health, lifestyle risk factors, and family history of disease. Counseling  Your health care provider may ask you questions about your:  Alcohol use.  Tobacco use.  Drug use.  Emotional well-being.  Home and relationship well-being.  Sexual activity.  Eating habits.  History of falls.  Memory and ability to understand (cognition).  Work and work Statistician. Screening  You may have the following tests or measurements:  Height, weight, and BMI.  Blood pressure.  Lipid and cholesterol levels. These may be checked every 5 years, or more frequently if you are over 61 years old.  Skin check.  Lung cancer screening. You may have this screening every year starting at age 31 if you have a 30-pack-year history of smoking and currently smoke or have quit within the past 15 years.  Fecal occult blood test (FOBT) of the stool. You may have this test every year starting at age 20.  Flexible sigmoidoscopy or colonoscopy. You may have a sigmoidoscopy every 5 years or a colonoscopy every 10 years starting at age 89.  Prostate cancer screening. Recommendations will vary depending on your family history and other risks.  Hepatitis C blood test.  Hepatitis B blood test.  Sexually transmitted disease (STD) testing.  Diabetes screening. This is done by checking your blood sugar (glucose) after you have not eaten for a while (fasting). You may have this done every 1-3 years.  Abdominal aortic aneurysm (AAA) screening. You may need this if you are a current or former smoker.  Osteoporosis. You may be screened starting at age 52 if you are at high risk. Talk with your health care  provider about your test results, treatment options, and if necessary, the need for more tests. Vaccines  Your health care provider may recommend certain vaccines, such as:  Influenza vaccine. This is recommended every year.  Tetanus,  diphtheria, and acellular pertussis (Tdap, Td) vaccine. You may need a Td booster every 10 years.  Zoster vaccine. You may need this after age 22.  Pneumococcal 13-valent conjugate (PCV13) vaccine. One dose is recommended after age 18.  Pneumococcal polysaccharide (PPSV23) vaccine. One dose is recommended after age 72. Talk to your health care provider about which screenings and vaccines you need and how often you need them. This information is not intended to replace advice given to you by your health care provider. Make sure you discuss any questions you have with your health care provider. Document Released: 09/18/2015 Document Revised: 05/11/2016 Document Reviewed: 06/23/2015 Elsevier Interactive Patient Education  2017 [place] Prevention in the Home Falls can cause injuries. They can happen to people of all ages. There are many things you can do to make your home safe and to help prevent falls. What can I do on the outside of my home?  Regularly fix the edges of walkways and driveways and fix any cracks.  Remove anything that might make you trip as you walk through a door, such as a raised step or threshold.  Trim any bushes or trees on the path to your home.  Use bright outdoor lighting.  Clear any walking paths of anything that might make someone trip, such as rocks or tools.  Regularly check to see if handrails are loose or broken. Make sure that both sides of any steps have handrails.  Any raised decks and porches should have guardrails on the edges.  Have any leaves, snow, or ice cleared regularly.  Use sand or salt on walking paths during winter.  Clean up any spills in your garage right away. This includes oil or grease spills. What can I do in the bathroom?  Use night lights.  Install grab bars by the toilet and in the tub and shower. Do not use towel bars as grab bars.  Use non-skid mats or decals in the tub or shower.  If you need to sit down in  the shower, use a plastic, non-slip stool.  Keep the floor dry. Clean up any water that spills on the floor as soon as it happens.  Remove soap buildup in the tub or shower regularly.  Attach bath mats securely with double-sided non-slip rug tape.  Do not have throw rugs and other things on the floor that can make you trip. What can I do in the bedroom?  Use night lights.  Make sure that you have a light by your bed that is easy to reach.  Do not use any sheets or blankets that are too big for your bed. They should not hang down onto the floor.  Have a firm chair that has side arms. You can use this for support while you get dressed.  Do not have throw rugs and other things on the floor that can make you trip. What can I do in the kitchen?  Clean up any spills right away.  Avoid walking on wet floors.  Keep items that you use a lot in easy-to-reach places.  If you need to reach something above you, use a strong step stool that has a grab bar.  Keep electrical cords out of the way.  Do not use floor polish  or wax that makes floors slippery. If you must use wax, use non-skid floor wax.  Do not have throw rugs and other things on the floor that can make you trip. What can I do with my stairs?  Do not leave any items on the stairs.  Make sure that there are handrails on both sides of the stairs and use them. Fix handrails that are broken or loose. Make sure that handrails are as long as the stairways.  Check any carpeting to make sure that it is firmly attached to the stairs. Fix any carpet that is loose or worn.  Avoid having throw rugs at the top or bottom of the stairs. If you do have throw rugs, attach them to the floor with carpet tape.  Make sure that you have a light switch at the top of the stairs and the bottom of the stairs. If you do not have them, ask someone to add them for you. What else can I do to help prevent falls?  Wear shoes that:  Do not have high  heels.  Have rubber bottoms.  Are comfortable and fit you well.  Are closed at the toe. Do not wear sandals.  If you use a stepladder:  Make sure that it is fully opened. Do not climb a closed stepladder.  Make sure that both sides of the stepladder are locked into place.  Ask someone to hold it for you, if possible.  Clearly mark and make sure that you can see:  Any grab bars or handrails.  First and last steps.  Where the edge of each step is.  Use tools that help you move around (mobility aids) if they are needed. These include:  Canes.  Walkers.  Scooters.  Crutches.  Turn on the lights when you go into a dark area. Replace any light bulbs as soon as they burn out.  Set up your furniture so you have a clear path. Avoid moving your furniture around.  If any of your floors are uneven, fix them.  If there are any pets around you, be aware of where they are.  Review your medicines with your doctor. Some medicines can make you feel dizzy. This can increase your chance of falling. Ask your doctor what other things that you can do to help prevent falls. This information is not intended to replace advice given to you by your health care provider. Make sure you discuss any questions you have with your health care provider. Document Released: 06/18/2009 Document Revised: 01/28/2016 Document Reviewed: 09/26/2014 Elsevier Interactive Patient Education  2017 [employer].

## 2020-11-24 NOTE — Progress Notes (Signed)
PCP notes:  Health Maintenance: No gaps noted    Abnormal Screenings: none   Patient concerns: Left foot pain    Nurse concerns: none   Next PCP appt.: 11/30/2020 @ 11:30 am

## 2020-11-30 ENCOUNTER — Telehealth: Payer: Self-pay

## 2020-11-30 ENCOUNTER — Encounter: Payer: Medicare PPO | Admitting: Family Medicine

## 2020-11-30 NOTE — Telephone Encounter (Signed)
[facility] Night - Client Nonclinical Telephone Record  [facility] Client [facility] Night - Client Client Site [place] - Night Physician [facility] - PHYSICIAN, Verita Schneiders- MD Contact Type Call Who Is Calling Patient / Member / Family / Caregiver Caller Name [place] Phone Number [phone] Patient Name Ernest Bell Patient DOB 1946/01/27 Call Type Message Only Information Provided Reason for Call Request to Schedule Office Appointment Initial Comment Ernest Bell states, needs to cancel his physical for tomorrow. Has work conflict. Disp. Time Disposition Final User 11/29/2020 1:08:39 PM General Information Provided Yes Jobie Quaker Call Closed By: Jobie Quaker Transaction Date/Time: 11/29/2020 1:04:55 PM (ET)

## 2021-01-22 ENCOUNTER — Encounter: Payer: Medicare PPO | Admitting: Family Medicine

## 2021-03-10 DIAGNOSIS — R351 Nocturia: Secondary | ICD-10-CM | POA: Diagnosis not present

## 2021-03-10 DIAGNOSIS — Z125 Encounter for screening for malignant neoplasm of prostate: Secondary | ICD-10-CM | POA: Diagnosis not present

## 2021-03-10 DIAGNOSIS — N401 Enlarged prostate with lower urinary tract symptoms: Secondary | ICD-10-CM | POA: Diagnosis not present

## 2021-04-14 DIAGNOSIS — S8265XD Nondisplaced fracture of lateral malleolus of left fibula, subsequent encounter for closed fracture with routine healing: Secondary | ICD-10-CM | POA: Diagnosis not present

## 2021-04-28 ENCOUNTER — Ambulatory Visit (INDEPENDENT_AMBULATORY_CARE_PROVIDER_SITE_OTHER): Payer: Medicare PPO | Admitting: Family Medicine

## 2021-04-28 ENCOUNTER — Encounter: Payer: Self-pay | Admitting: Family Medicine

## 2021-04-28 ENCOUNTER — Other Ambulatory Visit: Payer: Self-pay

## 2021-04-28 VITALS — BP 138/72 | HR 65 | Temp 98.0°F | Ht 65.0 in | Wt 162.0 lb

## 2021-04-28 DIAGNOSIS — G629 Polyneuropathy, unspecified: Secondary | ICD-10-CM | POA: Insufficient documentation

## 2021-04-28 DIAGNOSIS — Z0000 Encounter for general adult medical examination without abnormal findings: Secondary | ICD-10-CM

## 2021-04-28 DIAGNOSIS — I480 Paroxysmal atrial fibrillation: Secondary | ICD-10-CM

## 2021-04-28 DIAGNOSIS — Z7189 Other specified counseling: Secondary | ICD-10-CM

## 2021-04-28 DIAGNOSIS — K7581 Nonalcoholic steatohepatitis (NASH): Secondary | ICD-10-CM

## 2021-04-28 DIAGNOSIS — E785 Hyperlipidemia, unspecified: Secondary | ICD-10-CM

## 2021-04-28 DIAGNOSIS — K7460 Unspecified cirrhosis of liver: Secondary | ICD-10-CM | POA: Diagnosis not present

## 2021-04-28 DIAGNOSIS — I311 Chronic constrictive pericarditis: Secondary | ICD-10-CM | POA: Diagnosis not present

## 2021-04-28 NOTE — Assessment & Plan Note (Signed)
Advanced directives: has at home. HCPOA is wife. Will bring me copy.

## 2021-04-28 NOTE — Assessment & Plan Note (Addendum)
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  Continue gabapentin through ortho.

## 2021-04-28 NOTE — Patient Instructions (Addendum)
Schedule fasting labs at your convenience.  We will schedule repeat liver ultrasound.  If interested, check with pharmacy about new 2 shot shingles series (shingrix).  Bring me copy of COVID vaccine dates to update your chart or send through mychart.  Return as needed or in 1 year for next physical.   Health Maintenance After Age 75 After age 19, you are at a higher risk for certain long-term diseases and infections as well as injuries from falls. Falls are a major cause of broken bones and head injuries in people who are older than age 67. Getting regular preventive care can help to keep you healthy and well. Preventive care includes getting regular testing and making lifestyle changes as recommended by your health care provider. Talk with your health care provider about: Which screenings and tests you should have. A screening is a test that checks for a disease when you have no symptoms. A diet and exercise plan that is right for you. What should I know about screenings and tests to prevent falls? Screening and testing are the best ways to find a health problem early. Early diagnosis and treatment give you the best chance of managing medical conditions that are common after age 64. Certain conditions and lifestyle choices may make you more likely to have a fall. Your health care provider may recommend: Regular vision checks. Poor vision and conditions such as cataracts can make you more likely to have a fall. If you wear glasses, make sure to get your prescription updated if your vision changes. Medicine review. Work with your health care provider to regularly review all of the medicines you are taking, including over-the-counter medicines. Ask your health care provider about any side effects that may make you more likely to have a fall. Tell your health care provider if any medicines that you take make you feel dizzy or sleepy. Osteoporosis screening. Osteoporosis is a condition that causes the  bones to get weaker. This can make the bones weak and cause them to break more easily. Blood pressure screening. Blood pressure changes and medicines to control blood pressure can make you feel dizzy. Strength and balance checks. Your health care provider may recommend certain tests to check your strength and balance while standing, walking, or changing positions. Foot health exam. Foot pain and numbness, as well as not wearing proper footwear, can make you more likely to have a fall. Depression screening. You may be more likely to have a fall if you have a fear of falling, feel emotionally low, or feel unable to do activities that you used to do. Alcohol use screening. Using too much alcohol can affect your balance and may make you more likely to have a fall. What actions can I take to lower my risk of falls? General instructions Talk with your health care provider about your risks for falling. Tell your health care provider if: You fall. Be sure to tell your health care provider about all falls, even ones that seem minor. You feel dizzy, sleepy, or off-balance. Take over-the-counter and prescription medicines only as told by your health care provider. These include any supplements. Eat a healthy diet and maintain a healthy weight. A healthy diet includes low-fat dairy products, low-fat (lean) meats, and fiber from whole grains, beans, and lots of fruits and vegetables. Home safety Remove any tripping hazards, such as rugs, cords, and clutter. Install safety equipment such as grab bars in bathrooms and safety rails on stairs. Keep rooms and walkways well-lit. Activity  Follow a regular exercise program to stay fit. This will help you maintain your balance. Ask your health care provider what types of exercise are appropriate for you. If you need a cane or walker, use it as recommended by your health care provider. Wear supportive shoes that have nonskid soles.  Lifestyle Do not drink alcohol  if your health care provider tells you not to drink. If you drink alcohol, limit how much you have: 0-1 drink a day for women. 0-2 drinks a day for men. Be aware of how much alcohol is in your drink. In the [place] one drink equals one typical bottle of beer (12 oz), one-half glass of wine (5 oz), or one shot of hard liquor (1 oz). Do not use any products that contain nicotine or tobacco, such as cigarettes and e-cigarettes. If you need help quitting, ask your health care provider. Summary Having a healthy lifestyle and getting preventive care can help to protect your health and wellness after age 3. Screening and testing are the best way to find a health problem early and help you avoid having a fall. Early diagnosis and treatment give you the best chance for managing medical conditions that are more common for people who are older than age 29. Falls are a major cause of broken bones and head injuries in people who are older than age 48. Take precautions to prevent a fall at home. Work with your health care provider to learn what changes you can make to improve your health and wellness and to prevent falls. This information is not intended to replace advice given to you by your health care provider. Make sure you discuss any questions you have with your healthcare provider. Document Revised: 08/07/2020 Document Reviewed: 08/07/2020 Elsevier Patient Education  2022 [employer].

## 2021-04-28 NOTE — Assessment & Plan Note (Signed)
Update liver US and labs. Will check MELD score and Fib-4 score. Consider GI referral for EGD for esoph variceal screen.

## 2021-04-28 NOTE — Assessment & Plan Note (Signed)
Preventative protocols reviewed and updated unless pt declined. Discussed healthy diet and lifestyle.  

## 2021-04-28 NOTE — Assessment & Plan Note (Signed)
Sounds irregular but rate controlled. Has upcoming cards visit next week.

## 2021-04-28 NOTE — Assessment & Plan Note (Signed)
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 Update FLP when he returns. The 10-year ASCVD risk score Mikey Bussing DC Brooke Bonito., et al., 2013) is: 31.8%   Values used to calculate the score:     Age: 75 years     Sex: Male     Is Non-Hispanic African American: No     Diabetic: No     Tobacco smoker: No     Systolic Blood Pressure: 741 mmHg     Is BP treated: Yes     HDL Cholesterol: 40.4 mg/dL     Total Cholesterol: 195 mg/dL

## 2021-04-28 NOTE — Progress Notes (Signed)
Patient ID: Ernest Pac., male    DOB: 1945-12-28, 75 y.o.   MRN: [id number]  This visit was conducted in person.  BP 138/72   Pulse 65   Temp 98 F (36.7 C) (Temporal)   Ht 5' 5"  (1.651 m)   Wt 162 lb (73.5 kg)   SpO2 98%   BMI 26.96 kg/m    CC: CPE Subjective:   HPI: Ernest Bell. is a 75 y.o. male presenting on 04/28/2021 for Annual Exam   Saw health advisor 11/2020 for medicare wellness visit. Note reviewed.   No results found.  Flowsheet Row Clinical Support from 11/24/2020 in [facility]  PHQ-2 Total Score 0       Fall Risk  11/24/2020 07/31/2019 07/16/2018 07/07/2017 04/15/2016  Falls in the past year? 0 0 0 No No  Number falls in past yr: 0 0 - - -  Injury with Fall? 0 0 - - -  Risk for fall due to : No Fall Risks Medication side effect - - -  Follow up Falls evaluation completed;Falls prevention discussed Falls evaluation completed;Falls prevention discussed - - -    Qualified for tennis state finals in October - continues playing 3x/wk. Son ranked #1 in state for his age group.   Known NASH cirrhosis.  Afib with constrictive pericarditis sees Dr Rayann Heman on cardizem CD daily PRN afib. Off anticoagulant due to low stroke risk. Upcoming cardiology appt ([facility]) scheduled for next week.   H/o L foot fracture 06/2020 saw [facility] - this healed but he notes residual burning pain worse at night L>R treated with gabapentin 133m tid. Planning PT through them. No paresthesias or numbness.   Preventative: COLONOSCOPY 02/2000 - 2 hyperplastic polyps, IBS ([name]  Cologuard normal 09/2019  Prostate cancer screening - h/o BPH on flomax BID - sees Dr. WEliberto Ivoryurologist yearly with upcoming appt. PSA through them.  Lung cancer screening - not eligible  Flu shot - yearly  COVID vaccine - Moderna x2, booster x1  Pneumovax-23 2012. Prevnar-13 2016  Td 2011, 03/2013  zostavax - 08/2015  shingrix - discussed. To check with pharmacy   Advanced directives: has at home. HCPOA is wife. Will bring me copy.  Seat belt use discussed Sunscreen use discussed. No changing moles on skin.  Non smoker Alcohol - none  Dentist q6 mo Eye exam yearly - due.  Bowel - no constipation Bladder - no incontinence   [place], was cEcologistthen school teacher Activity: He enjoys walking 3 mi regularly and plays tennis three times a week.  Diet: good water, fruits/vegetables daily.      Relevant past medical, surgical, family and social history reviewed and updated as indicated. Interim medical history since our last visit reviewed. Allergies and medications reviewed and updated. Outpatient Medications Prior to Visit  Medication Sig Dispense Refill   diltiazem (CARDIZEM CD) 120 MG 24 hr capsule Take 1 capsule (120 mg total) by mouth as needed (As need for heart rate > 100). Please make overdue appt with Dr. ARayann Hemanbefore anymore refills. Thank you 2nd attempt 15 capsule 2   ibuprofen (ADVIL,MOTRIN) 800 MG tablet Take 1 tablet by mouth every 8 (eight) hours as needed for pain.  1   tamsulosin (FLOMAX) 0.4 MG CAPS capsule Take 0.4 mg by mouth 2 (two) times daily.     gabapentin (NEURONTIN) 100 MG capsule gabapentin 100 mg capsule  TAKE 1 CAPSULE  BY MOUTH THREE TIMES DAILY     gabapentin (NEURONTIN) 100 MG capsule Take 1 capsule (100 mg total) by mouth 3 (three) times daily.     No facility-administered medications prior to visit.     Per HPI unless specifically indicated in ROS section below Review of Systems  Constitutional:  Negative for activity change, appetite change, chills, fatigue, fever and unexpected weight change.  HENT:  Negative for hearing loss.   Eyes:  Negative for visual disturbance.  Respiratory:  Negative for cough, chest tightness, shortness of breath and wheezing.   Cardiovascular:  Negative for chest pain, palpitations and leg swelling.  Gastrointestinal:   Negative for abdominal distention, abdominal pain, blood in stool, constipation, diarrhea, nausea and vomiting.  Genitourinary:  Negative for difficulty urinating and hematuria.  Musculoskeletal:  Negative for arthralgias, myalgias and neck pain.  Skin:  Negative for rash.  Neurological:  Negative for dizziness, seizures, syncope and headaches.  Hematological:  Negative for adenopathy. Bruises/bleeds easily.  Psychiatric/Behavioral:  Negative for dysphoric mood. The patient is not nervous/anxious.    Objective:  BP 138/72   Pulse 65   Temp 98 F (36.7 C) (Temporal)   Ht 5' 5"  (1.651 m)   Wt 162 lb (73.5 kg)   SpO2 98%   BMI 26.96 kg/m   Wt Readings from Last 3 Encounters:  04/28/21 162 lb (73.5 kg)  08/03/20 149 lb (67.6 kg)  08/12/19 168 lb (76.2 kg)      Physical Exam Vitals and nursing note reviewed.  Constitutional:      General: He is not in acute distress.    Appearance: Normal appearance. He is well-developed. He is not ill-appearing.  HENT:     Head: Normocephalic and atraumatic.     Right Ear: Hearing, tympanic membrane, ear canal and external ear normal.     Left Ear: Hearing, tympanic membrane, ear canal and external ear normal.  Eyes:     General: No scleral icterus.    Extraocular Movements: Extraocular movements intact.     Conjunctiva/sclera: Conjunctivae normal.     Pupils: Pupils are equal, round, and reactive to light.  Neck:     Thyroid: No thyroid mass or thyromegaly.     Vascular: No carotid bruit.  Cardiovascular:     Rate and Rhythm: Normal rate. Rhythm irregularly irregular.     Pulses: Normal pulses.          Radial pulses are 2+ on the right side and 2+ on the left side.     Heart sounds: Normal heart sounds. No murmur heard. Pulmonary:     Effort: Pulmonary effort is normal. No respiratory distress.     Breath sounds: Normal breath sounds. No wheezing, rhonchi or rales.  Abdominal:     General: Bowel sounds are normal. There is no  distension.     Palpations: Abdomen is soft. There is no mass.     Tenderness: There is no abdominal tenderness. There is no guarding or rebound.     Hernia: No hernia is present.  Musculoskeletal:        General: Normal range of motion.     Cervical back: Normal range of motion and neck supple.     Right lower leg: No edema.     Left lower leg: No edema.  Lymphadenopathy:     Cervical: No cervical adenopathy.  Skin:    General: Skin is warm and dry.     Findings: No rash.  Neurological:  General: No focal deficit present.     Mental Status: He is alert and oriented to person, place, and time.  Psychiatric:        Mood and Affect: Mood normal.        Behavior: Behavior normal.        Thought Content: Thought content normal.        Judgment: Judgment normal.      Results for orders placed or performed in visit on 09/19/19  Cologuard  Result Value Ref Range   Cologuard Negative Negative    Assessment & Plan:  This visit occurred during the SARS-CoV-2 public health emergency.  Safety protocols were in place, including screening questions prior to the visit, additional usage of staff PPE, and extensive cleaning of exam room while observing appropriate contact time as indicated for disinfecting solutions.   Problem List Items Addressed This Visit     Health maintenance examination - Primary (Chronic)    Preventative protocols reviewed and updated unless pt declined. Discussed healthy diet and lifestyle.       Advanced directives, counseling/discussion (Chronic)    Advanced directives: has at home. HCPOA is wife. Will bring me copy.       Constrictive pericarditis   Paroxysmal atrial fibrillation (HCC)    Sounds irregular but rate controlled. Has upcoming cards visit next week.       Dyslipidemia    Off medication for this. Update FLP when he returns. The 10-year ASCVD risk score Mikey Bussing DC Brooke Bonito., et al., 2013) is: 31.8%   Values used to calculate the score:     Age: 66  years     Sex: Male     Is Non-Hispanic African American: No     Diabetic: No     Tobacco smoker: No     Systolic Blood Pressure: 716 mmHg     Is BP treated: Yes     HDL Cholesterol: 40.4 mg/dL     Total Cholesterol: 195 mg/dL       Relevant Orders   Lipid panel   Comprehensive metabolic panel   TSH   Liver cirrhosis secondary to NASH (nonalcoholic steatohepatitis) ([place])    Update liver US and labs. Will check MELD score and Fib-4 score. Consider GI referral for EGD for esoph variceal screen.       Relevant Orders   US ABDOMEN LIMITED RUQ (LIVER/GB)   Comprehensive metabolic panel   TSH   CBC with Differential/Platelet   Protime-INR   Neuropathy    Check for labs that may contribute to periph neuropathy.  Continue gabapentin through ortho.       Relevant Orders   Vitamin B12   Folate   Vitamin B1     No orders of the defined types were placed in this encounter.  Orders Placed This Encounter  Procedures   US ABDOMEN LIMITED RUQ (LIVER/GB)    Standing Status:   Future    Standing Expiration Date:   04/28/2022    Order Specific Question:   Reason for Exam (SYMPTOM  OR DIAGNOSIS REQUIRED)    Answer:   NASH cirrhosis    Order Specific Question:   Preferred imaging location?    Answer:   [facility]   Vitamin B12    Standing Status:   Future    Standing Expiration Date:   04/28/2022   Folate    Standing Status:   Future    Standing Expiration Date:   04/28/2022   Lipid panel  Standing Status:   Future    Standing Expiration Date:   04/28/2022   Comprehensive metabolic panel    Standing Status:   Future    Standing Expiration Date:   04/28/2022   TSH    Standing Status:   Future    Standing Expiration Date:   04/28/2022   CBC with Differential/Platelet    Standing Status:   Future    Standing Expiration Date:   04/28/2022   Protime-INR    Standing Status:   Future    Standing Expiration Date:   04/28/2022   Vitamin B1    Standing Status:   Future     Standing Expiration Date:   04/28/2022    Patient instructions: Schedule fasting labs at your convenience.  We will schedule repeat liver ultrasound.  If interested, check with pharmacy about new 2 shot shingles series (shingrix).  Bring me copy of COVID vaccine dates to update your chart or send through mychart.  Return as needed or in 1 year for next physical.   Follow up plan: Return in about 1 year (around 04/28/2022) for annual exam, prior fasting for blood work, medicare wellness visit.  Ria Bush, MD

## 2021-05-03 DIAGNOSIS — M25572 Pain in left ankle and joints of left foot: Secondary | ICD-10-CM | POA: Diagnosis not present

## 2021-05-05 ENCOUNTER — Other Ambulatory Visit: Payer: Self-pay

## 2021-05-05 ENCOUNTER — Ambulatory Visit: Payer: Medicare PPO | Admitting: Internal Medicine

## 2021-05-05 VITALS — BP 110/64 | HR 89 | Ht 65.0 in | Wt 164.8 lb

## 2021-05-05 DIAGNOSIS — I480 Paroxysmal atrial fibrillation: Secondary | ICD-10-CM | POA: Diagnosis not present

## 2021-05-05 MED ORDER — DILTIAZEM HCL ER COATED BEADS 120 MG PO CP24: 120.0000 mg | ORAL_CAPSULE | Freq: Every day | ORAL | 2 refills | Status: DC | PRN

## 2021-05-05 NOTE — Progress Notes (Signed)
PCP: Ria Bush, MD   Primary EP: Dr Doristine Mango Davelle Anselmi. is a 75 y.o. male who presents today for routine electrophysiology followup.  Since last being seen in our clinic, the patient reports doing very well.  Today, he denies symptoms of palpitations, chest pain, shortness of breath,  lower extremity edema, dizziness, presyncope, or syncope.  The patient is otherwise without complaint today.   Past Medical History:  Diagnosis Date   Actinic keratosis    Atrial fib/flutter, transient 04/28/10   typical appearing atrial flutter dx 04/28/10 s/p CTI ablation 10/11   BPH (benign prostatic hyperplasia)    PSA stable, followed by Dr. Eliberto Ivory   CHF (congestive heart failure) (HCC)    Constrictive pericarditis 1969   unknown cause s/p pericardectomy   Coronary artery disease    Depression    on sertraline   DJD (degenerative joint disease)    Gout    Liver cirrhosis secondary to NASH (nonalcoholic steatohepatitis) ([place]) 2001   ?by liver biopsy   Persistent atrial fibrillation (HCC) 09/15/10   S/[facility]VI ([facility] at [facility]   Syncope 1997   recurrent S/P tilt study by Dr. [name] (like due to dysautonomia)   Past Surgical History:  Procedure Laterality Date   afib abation  09/15/10   [place] at [place], [name] ablation at [facility] 04/28/10   [facility]   COLONOSCOPY  02/2000   2 hyperplastic polyps, [name]   ESOPHAGOGASTRODUODENOSCOPY  02/2000   [name]   HEMORROIDECTOMY  2007   KNEE SURGERY  2007   he had osteoarthritis with left knee surgery   PERICARDIECTOMY  1969   at [facility]   right Gaffer     for osteoarthritis    ROS- all systems are reviewed and negatives except as per HPI above  Current Outpatient Medications  Medication Sig Dispense Refill   diltiazem (CARDIZEM CD) 120 MG 24 hr capsule Take 1 capsule (120 mg total) by mouth as needed (As need for heart rate > 100). Please make overdue appt with Dr. Rayann Heman before anymore  refills. Thank you 2nd attempt 15 capsule 2   gabapentin (NEURONTIN) 100 MG capsule Take 1 capsule (100 mg total) by mouth 3 (three) times daily.     ibuprofen (ADVIL,MOTRIN) 800 MG tablet Take 1 tablet by mouth every 8 (eight) hours as needed for pain.  1   tamsulosin (FLOMAX) 0.4 MG CAPS capsule Take 0.4 mg by mouth 2 (two) times daily.     No current facility-administered medications for this visit.    Physical Exam: Vitals:   05/05/21 1439  BP: 110/64  Pulse: 89  SpO2: 96%  Weight: 164 lb 12.8 oz (74.8 kg)  Height: 5' 5"  (1.651 m)    GEN- The patient is well appearing, alert and oriented x 3 today.   Head- normocephalic, atraumatic Eyes-  Sclera clear, conjunctiva pink Ears- hearing intact Oropharynx- clear Lungs- Clear to ausculation bilaterally, normal work of breathing Heart- Regular rate and rhythm, no murmurs, rubs or gallops, PMI not laterally displaced GI- soft, NT, ND, + BS Extremities- no clubbing, cyanosis, or edema  Wt Readings from Last 3 Encounters:  05/05/21 164 lb 12.8 oz (74.8 kg)  04/28/21 162 lb (73.5 kg)  08/03/20 149 lb (67.6 kg)    EKG tracing ordered today is personally reviewed and shows sinus with PACs, PVCs  Assessment and Plan:  Paroxysmal atrial fibrillation Well controlled Chads2vasc score is 1. Does not require  [place]  Return to see EP APP in a year  [name] MD, [facility] 05/05/2021 2:46 PM

## 2021-05-05 NOTE — Patient Instructions (Signed)
Medication Instructions:  Your physician recommends that you continue on your current medications as directed. Please refer to the Current Medication list given to you today.  Labwork: None ordered.  Testing/Procedures: None ordered.  Follow-Up: Your physician wants you to follow-up in: 12 months with  [name], MD or one of the following Advanced Practice Providers on your designated Care Team:    Tommye Standard, PA-C    You will receive a reminder letter in the mail two months in advance. If you don't receive a letter, please call our office to schedule the follow-up appointment.   Any Other Special Instructions Will Be Listed Below (If Applicable).  If you need a refill on your cardiac medications before your next appointment, please call your pharmacy.

## 2021-05-06 ENCOUNTER — Other Ambulatory Visit (INDEPENDENT_AMBULATORY_CARE_PROVIDER_SITE_OTHER): Payer: Medicare PPO

## 2021-05-06 DIAGNOSIS — Z125 Encounter for screening for malignant neoplasm of prostate: Secondary | ICD-10-CM | POA: Diagnosis not present

## 2021-05-06 DIAGNOSIS — E785 Hyperlipidemia, unspecified: Secondary | ICD-10-CM

## 2021-05-06 DIAGNOSIS — K7460 Unspecified cirrhosis of liver: Secondary | ICD-10-CM

## 2021-05-06 DIAGNOSIS — R739 Hyperglycemia, unspecified: Secondary | ICD-10-CM | POA: Diagnosis not present

## 2021-05-06 DIAGNOSIS — G629 Polyneuropathy, unspecified: Secondary | ICD-10-CM

## 2021-05-06 DIAGNOSIS — K7581 Nonalcoholic steatohepatitis (NASH): Secondary | ICD-10-CM | POA: Diagnosis not present

## 2021-05-06 LAB — CBC WITH DIFFERENTIAL/PLATELET
Basophils Absolute: 0.1 10*3/uL (ref 0.0–0.1)
Basophils Relative: 0.8 % (ref 0.0–3.0)
Eosinophils Absolute: 0.3 10*3/uL (ref 0.0–0.7)
Eosinophils Relative: 4.9 % (ref 0.0–5.0)
HCT: 41.8 % (ref 39.0–52.0)
Hemoglobin: 14.6 g/dL (ref 13.0–17.0)
Lymphocytes Relative: 29.2 % (ref 12.0–46.0)
Lymphs Abs: 1.9 10*3/uL (ref 0.7–4.0)
MCHC: 35 g/dL (ref 30.0–36.0)
MCV: 88.6 fl (ref 78.0–100.0)
Monocytes Absolute: 0.6 10*3/uL (ref 0.1–1.0)
Monocytes Relative: 9.1 % (ref 3.0–12.0)
Neutro Abs: 3.7 10*3/uL (ref 1.4–7.7)
Neutrophils Relative %: 56 % (ref 43.0–77.0)
Platelets: 173 10*3/uL (ref 150.0–400.0)
RBC: 4.71 Mil/uL (ref 4.22–5.81)
RDW: 13.6 % (ref 11.5–15.5)
WBC: 6.5 10*3/uL (ref 4.0–10.5)

## 2021-05-06 LAB — COMPREHENSIVE METABOLIC PANEL
ALT: 19 U/L (ref 0–53)
AST: 18 U/L (ref 0–37)
Albumin: 4.4 g/dL (ref 3.5–5.2)
Alkaline Phosphatase: 50 U/L (ref 39–117)
BUN: 20 mg/dL (ref 6–23)
CO2: 27 mEq/L (ref 19–32)
Calcium: 9.1 mg/dL (ref 8.4–10.5)
Chloride: 107 mEq/L (ref 96–112)
Creatinine, Ser: 0.89 mg/dL (ref 0.40–1.50)
GFR: 84.15 mL/min (ref >60.00)
Glucose, Bld: 131 mg/dL — ABNORMAL HIGH (ref 70–99)
Potassium: 4.1 mEq/L (ref 3.5–5.1)
Sodium: 141 mEq/L (ref 135–145)
Total Bilirubin: 2.1 mg/dL — ABNORMAL HIGH (ref 0.2–1.2)
Total Protein: 6.5 g/dL (ref 6.0–8.3)

## 2021-05-06 LAB — FOLATE: Folate: 10.2 ng/mL (ref >5.9)

## 2021-05-06 LAB — HEMOGLOBIN A1C: Hgb A1c MFr Bld: 5.6 % (ref 4.6–6.5)

## 2021-05-06 LAB — PROTIME-INR
INR: 1 ratio (ref 0.8–1.0)
Prothrombin Time: 11.1 s (ref 9.6–13.1)

## 2021-05-06 LAB — LIPID PANEL
Cholesterol: 188 mg/dL (ref 0–200)
HDL: 43.4 mg/dL (ref >39.00)
LDL Cholesterol: 121 mg/dL — ABNORMAL HIGH (ref 0–99)
NonHDL: 144.98
Total CHOL/HDL Ratio: 4
Triglycerides: 119 mg/dL (ref 0.0–149.0)
VLDL: 23.8 mg/dL (ref 0.0–40.0)

## 2021-05-06 LAB — VITAMIN B12: Vitamin B-12: 276 pg/mL (ref 211–911)

## 2021-05-06 LAB — PSA: PSA: 2.32 ng/mL (ref 0.10–4.00)

## 2021-05-06 LAB — TSH: TSH: 2.66 u[IU]/mL (ref 0.35–5.50)

## 2021-05-11 DIAGNOSIS — H353110 Nonexudative age-related macular degeneration, right eye, stage unspecified: Secondary | ICD-10-CM | POA: Diagnosis not present

## 2021-05-13 LAB — VITAMIN B1: Vitamin B1 (Thiamine): 11 nmol/L (ref 8–30)

## 2021-05-17 DIAGNOSIS — Z961 Presence of intraocular lens: Secondary | ICD-10-CM | POA: Diagnosis not present

## 2021-05-17 DIAGNOSIS — H35363 Drusen (degenerative) of macula, bilateral: Secondary | ICD-10-CM | POA: Diagnosis not present

## 2021-05-18 ENCOUNTER — Other Ambulatory Visit: Payer: Self-pay

## 2021-05-18 ENCOUNTER — Ambulatory Visit
Admission: RE | Admit: 2021-05-18 | Discharge: 2021-05-18 | Disposition: A | Discharge status: Home / Self Care | Payer: Medicare PPO | Source: Ambulatory Visit | Attending: Family Medicine | Admitting: Family Medicine

## 2021-05-18 DIAGNOSIS — K7460 Unspecified cirrhosis of liver: Secondary | ICD-10-CM | POA: Diagnosis not present

## 2021-05-18 DIAGNOSIS — K7581 Nonalcoholic steatohepatitis (NASH): Secondary | ICD-10-CM | POA: Insufficient documentation

## 2021-05-19 ENCOUNTER — Telehealth: Payer: Self-pay | Admitting: Family Medicine

## 2021-05-19 DIAGNOSIS — N2889 Other specified disorders of kidney and ureter: Secondary | ICD-10-CM | POA: Insufficient documentation

## 2021-05-19 DIAGNOSIS — K7581 Nonalcoholic steatohepatitis (NASH): Secondary | ICD-10-CM

## 2021-05-19 DIAGNOSIS — K7460 Unspecified cirrhosis of liver: Secondary | ICD-10-CM

## 2021-05-19 NOTE — Telephone Encounter (Signed)
CT approved Will call to schedule it 05/20/21  Urology referral to be sent as well

## 2021-05-19 NOTE — Telephone Encounter (Signed)
Received phone call from radiology about incidental finding of 4cm R kidney mass concerning for malgnancy. I spoke with patient about this.  Will proceed with renal protocol CT and urology referral for further evaluation. Will try to expedite. Pt agrees with plan.

## 2021-05-21 ENCOUNTER — Other Ambulatory Visit: Payer: Self-pay | Admitting: Family Medicine

## 2021-05-21 DIAGNOSIS — N5201 Erectile dysfunction due to arterial insufficiency: Secondary | ICD-10-CM | POA: Diagnosis not present

## 2021-05-21 DIAGNOSIS — N401 Enlarged prostate with lower urinary tract symptoms: Secondary | ICD-10-CM | POA: Diagnosis not present

## 2021-05-21 DIAGNOSIS — C649 Malignant neoplasm of unspecified kidney, except renal pelvis: Secondary | ICD-10-CM | POA: Diagnosis not present

## 2021-05-21 NOTE — Telephone Encounter (Signed)
Patient scheduled for 05/24/21 for the CT - when I called to discuss appt information with patient he stated that he cannot go and is having to reschedule it. He is going to be out of town part of next week. PCP made aware.   He was unsure of a good time to reschedule so I advised him to call and speak with them and reschedule to a time that best fits his schedule. I told him that it would be nice If he is able to go before his Urology appt on 9/28, they should be able to schedule him before then give its STAT. They were unable to schedule him today give he has to prep for the test and be NPO x 4 hours prior.   Pt is going to call to get a different appt date.

## 2021-05-24 ENCOUNTER — Ambulatory Visit: Payer: Medicare PPO

## 2021-05-27 ENCOUNTER — Ambulatory Visit
Admission: RE | Admit: 2021-05-27 | Discharge: 2021-05-27 | Disposition: A | Discharge status: Home / Self Care | Payer: Medicare PPO | Source: Ambulatory Visit | Attending: Family Medicine | Admitting: Family Medicine

## 2021-05-27 ENCOUNTER — Other Ambulatory Visit: Payer: Self-pay | Admitting: Family Medicine

## 2021-05-27 ENCOUNTER — Telehealth: Payer: Self-pay

## 2021-05-27 ENCOUNTER — Other Ambulatory Visit: Payer: Self-pay

## 2021-05-27 DIAGNOSIS — K5730 Diverticulosis of large intestine without perforation or abscess without bleeding: Secondary | ICD-10-CM | POA: Diagnosis not present

## 2021-05-27 DIAGNOSIS — N2889 Other specified disorders of kidney and ureter: Secondary | ICD-10-CM | POA: Insufficient documentation

## 2021-05-27 DIAGNOSIS — K769 Liver disease, unspecified: Secondary | ICD-10-CM

## 2021-05-27 DIAGNOSIS — R160 Hepatomegaly, not elsewhere classified: Secondary | ICD-10-CM

## 2021-05-27 DIAGNOSIS — K7460 Unspecified cirrhosis of liver: Secondary | ICD-10-CM | POA: Diagnosis not present

## 2021-05-27 MED ORDER — IOHEXOL 350 MG/ML SOLN
80.0000 mL | Freq: Once | INTRAVENOUS | Status: AC | PRN
  Administered 2021-05-27: 80 mL via INTRAVENOUS

## 2021-05-27 NOTE — Telephone Encounter (Signed)
Spoke with patient regarding liver and kidney findings.  He has uro appt scheduled for next week.  Offered MRI liver for further evaluation of 1.2cm mass ?liver primary vs kidney met. Pt prefers to first talk with Dr Erlene Quan urology next week. He is also going out of town this weekend so would be unavailable for imaging.

## 2021-05-27 NOTE — Telephone Encounter (Signed)
Diane with [facility] radiology called report for CT renal abd with and without contrast. Report is in [place];  Pt has appt with [facility] urological scheduled on 06/02/21. I left v/m for Dr Darnell Level on cell who is out of office today; Will send report to Dr Darnell Level and will send the report to Dr Damita Dunnings who is in office and [facility] CMA. Sending teams for [employer] also.

## 2021-05-27 NOTE — Telephone Encounter (Signed)
Called, unable to reach patient. Will try again in a little bit.

## 2021-05-27 NOTE — Telephone Encounter (Signed)
Imaging report d/w PCP and he'll update patient.  I'll defer.  I thank all involved.

## 2021-05-28 ENCOUNTER — Telehealth: Payer: Self-pay | Admitting: Family Medicine

## 2021-05-28 DIAGNOSIS — R160 Hepatomegaly, not elsewhere classified: Secondary | ICD-10-CM

## 2021-05-28 DIAGNOSIS — K7460 Unspecified cirrhosis of liver: Secondary | ICD-10-CM

## 2021-05-28 DIAGNOSIS — N2889 Other specified disorders of kidney and ureter: Secondary | ICD-10-CM

## 2021-05-28 NOTE — Telephone Encounter (Signed)
Pt's wife called stating Ernest Bell has changed his mind and would like to have the MRI scheduled either Monday 9/26 or Tuesday 9/27. Are we able to get this taken care of?

## 2021-05-28 NOTE — Addendum Note (Signed)
Addended by: Ria Bush on: 05/28/2021 01:34 PM   Modules accepted: Orders

## 2021-05-28 NOTE — Telephone Encounter (Signed)
See other phone note

## 2021-05-28 NOTE — Telephone Encounter (Signed)
Pt called stating that Dr. Darnell Level asked him to call him to schedule a MRI

## 2021-05-28 NOTE — Telephone Encounter (Signed)
MRI ordered. Will route to referral pool

## 2021-05-31 ENCOUNTER — Other Ambulatory Visit: Payer: Medicare PPO

## 2021-06-01 NOTE — Telephone Encounter (Signed)
Pt wife called checking on the status of his MRI. Pt wife returning call.

## 2021-06-01 NOTE — Progress Notes (Signed)
06/08/21 8:30 AM   Ernest Pac. 1946-06-18 [id number]  Referring provider:  Ria Bush, MD [address] [place],  [place] [address] Chief Complaint  Patient presents with   renal mass     HPI: Ernest Brizzi. is a 75 y.o.male who presents today for further devaluation of kidney diease and right renal mass.   Underwent ultrasound of abdomen on 05/18/2021 that revealed 4.4 cm solid mass with internal blood flow and probably central necrosis exophytic from the right kidney lower pole. High suspicion for renal cell carcinoma similar malignancy.   He underwent a CT renal abdomen w/wo contrast on 05/27/221 for mass seen on ultrasound it revealed Arterially enhancing exophytic mass of the medial inferior pole of the right kidney measuring 4.4 x 4.2 cm. No evidence of renal vein invasion or lymphadenopathy in the abdomen. Findings are consistent with renal cell carcinoma. Also a hyperenhancing subcapsular lesion of the peripheral superior right lobe of the liver, hepatic segment VII, measuring 1.2 x 1.2 cm, with some suggestion of washout on multiphasic imaging. With concern for an incidental small hepatocellular carcinoma in the setting of cirrhosis. Hepatic metastasis of renal cell carcinoma is a general differential consideration, although less favored.   Lesion is in close proximity to hilum but does not seem to invade   He reports he has an MRI scheduled today. He is accompanied by his wife.   He has a history of cirrhosis which is why he underwent a CT. He has been experiencing back pain but denies any blood in urine or burning.   His wife reports that he underwent a liver biopsy in the past.  PMH: Past Medical History:  Diagnosis Date   Actinic keratosis    Atrial fib/flutter, transient 04/28/10   typical appearing atrial flutter dx 04/28/10 s/p CTI ablation 10/11   BPH (benign prostatic hyperplasia)    PSA stable, followed by Dr. 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 [name] (like due to dysautonomia)    Surgical History: Past Surgical History:  Procedure Laterality Date   afib abation  09/15/10   Cryo at [name] ablation at [facility] 04/28/10   [place]   COLONOSCOPY  02/2000   2 hyperplastic polyps, [name]   ESOPHAGOGASTRODUODENOSCOPY  02/2000   [name]   HEMORROIDECTOMY  2007   [place] SURGERY  2007   he had osteoarthritis with left knee surgery   PERICARDIECTOMY  1969   at [facility]   right [place]     for osteoarthritis    Home Medications:  Allergies as of 06/02/2021       Reactions   Penicillins Rash        Medication List        Accurate as of June 02, 2021 11:59 PM. If you have any questions, ask your nurse or doctor.          STOP taking these medications    diltiazem 120 MG 24 hr capsule  Commonly known as: CARDIZEM CD Stopped by: Hollice Espy, MD       TAKE these medications    diltiazem 30 MG tablet Commonly known as: CARDIZEM Take by mouth.   gabapentin 100 MG capsule Commonly known as: NEURONTIN Take 1 capsule (100 mg total) by mouth 3 (three) times daily.   ibuprofen 800 MG tablet Commonly known as: ADVIL Take 1 tablet by mouth every 8 (eight) hours as needed for pain.   tamsulosin 0.4 MG Caps capsule Commonly known as: FLOMAX Take 0.4 mg by mouth 2 (two) times daily.        Allergies:  Allergies  Allergen Reactions   Penicillins Rash    Family History: Family History  Problem Relation Age of Onset   Hypertension Mother     Alcohol abuse Father        smoker   Heart disease Father        MI (smoker)    Social History:  reports that he has never smoked. He has never used smokeless tobacco. He reports that he does not drink alcohol and does not use drugs.   Physical Exam: BP (!) 178/96   Pulse 67   Ht 5' 5"  (1.651 m)   Wt 160 lb (72.6 kg)   BMI 26.63 kg/m   Constitutional:  Alert and oriented, No acute distress. HEENT: [place] AT, moist mucus membranes.  Trachea midline, no masses. Cardiovascular: No clubbing, cyanosis, or edema. Respiratory: Normal respiratory effort, no increased work of breathing. GI: Abdomen is soft, nontender, nondistended, no abdominal masses GU: No CVA tenderness Skin: No rashes, bruises or suspicious lesions. Neurologic: Grossly intact, no focal deficits, moving all 4 extremities. Psychiatric: Normal mood and affect.  Laboratory Data:  Lab Results  Component Value Date   CREATININE 0.89 05/06/2021    Lab Results  Component Value Date   PSA 2.32 05/06/2021   PSA 2.39 08/05/2019   PSA 1.90 07/16/2018    Lab Results  Component Value Date   HGBA1C 5.6 05/06/2021    Pertinent Imaging: CLINICAL DATA:  Follow-up right renal mass incidentally identified by ultrasound   EXAM: CT ABDOMEN WITHOUT AND WITH CONTRAST   TECHNIQUE: Multidetector CT imaging of the abdomen was performed following the standard protocol before and following the bolus administration of intravenous contrast.   CONTRAST:  70m OMNIPAQUE IOHEXOL 350 MG/ML SOLN   COMPARISON:  Renal ultrasound, 05/18/2021   FINDINGS: Lower chest: No acute abnormality.  Cardiomegaly.   Hepatobiliary: Coarse, nodular, cirrhotic morphology of the liver. There is a focally hyperenhancing subcapsular lesion of the peripheral superior right lobe of the liver, hepatic segment VII, measuring 1.2 x 1.2 cm, with some suggestion of washout on multiphasic imaging (series 6, image 28, series 7, image 28). Status post  cholecystectomy. No biliary ductal dilatation   Pancreas: Unremarkable. No pancreatic ductal dilatation or surrounding inflammatory changes.   Spleen: Mild splenomegaly, maximum span 14.3 cm.   Adrenals/Urinary Tract: Adrenal glands are unremarkable. Arterially enhancing exophytic mass of the medial inferior pole of the right kidney measuring 4.4 x 4.2 cm (series 6, image 65). No evidence of renal vein invasion. The kidneys are otherwise normal, without renal calculi or hydronephrosis.   Stomach/Bowel: Stomach is within normal limits. Appendix appears normal. No evidence of bowel wall thickening, distention, or inflammatory changes. Colonic diverticulosis.   Vascular/Lymphatic: Aortic atherosclerosis. No enlarged abdominal lymph nodes.   Other: No abdominal wall hernia or abnormality.   Musculoskeletal: No acute or significant osseous findings.  IMPRESSION: 1. Arterially enhancing exophytic mass of the medial inferior pole of the right kidney measuring 4.4 x 4.2 cm. No evidence of renal vein invasion or lymphadenopathy in the abdomen. Findings are consistent with renal cell carcinoma. 2. There is a focally hyperenhancing subcapsular lesion of the peripheral superior right lobe of the liver, hepatic segment VII, measuring 1.2 x 1.2 cm, with some suggestion of washout on multiphasic imaging. This is concerning for an incidental small hepatocellular carcinoma in the setting of cirrhosis. Hepatic metastasis of renal cell carcinoma is a general differential consideration, although less favored. Recommend multiphasic contrast enhanced MRI to further characterize. 3. Cirrhotic morphology of the liver and mild splenomegaly.   These results will be called to the ordering clinician or representative by the Radiologist Assistant, and communication documented in the PACS or [employer].   Aortic Atherosclerosis (ICD10-I70.0).     Electronically Signed   By: Eddie Candle  M.D.   On: 05/27/2021 13:13  CLINICAL DATA:  Cirrhosis, NASH   EXAM: ULTRASOUND ABDOMEN LIMITED RIGHT UPPER QUADRANT   COMPARISON:  Multiple exams, including 09/17/2018   FINDINGS: Gallbladder:   Surgically absent   Common bile duct:   Diameter: 0.4 cm   Liver:   Lobular margins of the liver suspicious for cirrhosis. Accentuated echogenicity in the liver. No focal liver mass is identified. Portal vein is patent on color Doppler imaging with normal direction of blood flow towards the liver.   Other: Incidentally noted in the course of performing today's exam, there is an exophytic 4.4 cm solid mass from the right kidney lower pole which appears to have internal blood flow and possible central necrosis. High suspicion for malignancy such as renal cell carcinoma. Dedicated characterization by renal protocol CT or MRI is recommended.   IMPRESSION: 1. 4.4 cm solid mass with internal blood flow and probably central necrosis exophytic from the right kidney lower pole. High suspicion for renal cell carcinoma similar malignancy. Renal protocol CT or MRI is recommended for definitive characterization. 2. Lobular margins of the liver suspicious for cirrhosis, with associated accentuated hepatic echogenicity. I do not see a discrete hepatic mass. No biliary dilatation.   Radiology assistant personnel have been notified to put me in telephone contact with the referring physician or the referring physician's clinical representative in order to discuss these findings. Once this communication is established I will issue an addendum to this report for documentation purposes.   Electronically Signed: By: Van Clines M.D. On: 05/19/2021 10:23  The above CT was personally reviewed today.  Agree with radiologic interpretation.  MRI is scheduled for later today and is pending of the liver.  Assessment & Plan:    Right renal mass  - A solid renal mass raises the suspicion of  primary renal malignancy.  We discussed this in detail and in regards to the spectrum of renal masses which includes cysts (pure cysts are considered benign), solid masses and everything in between. The risk of metastasis increases as the size of solid renal mass increases. In general, it is believed that the risk of metastasis for renal masses less than 3-4 cm is small (up to approximately 5%) based mainly on large retrospective studies. In some cases and especially in patients of older age and multiple comorbidities a surveillance approach may be appropriate. The treatment of solid renal masses includes: surveillance, cryoablation (percutaneous and laparoscopic) in addition to partial and complete nephrectomy (each with option of laparoscopic, robotic and open depending on appropriateness). Furthermore, nephrectomy  appears to be an independent risk factor for the development of chronic kidney disease suggesting that nephron sparing approaches should be implored whenever feasible. We reviewed these options in context of the patients current situation as well as the pros and cons of each. -Treatment plan will be somewhat dependent upon further work-up of #2 -We discussed today that if there were 2 primary disease processes, could consider radical nephrectomy versus partial nephrectomy.  Each of these would be possible via minimally invasive approach.  Renal mass does encroach close to the hilar vessels but does appear to be technically feasible, may require conversion to open if he elects this approach.  We have agreed to discuss this further depending on #2.   2. Liver lesion  - Medical oncology referral sent -MRI is scheduled for later today and is pending, advised the patient that we will call him early next week once we have these results.  This will likely need to be biopsied and or treated depending on the results of the MRI and further medical oncology work-up.  Also plan to present him at tumor board  to come up with a strategic plan about how to sequence treatment for the above especially there is 2 primary processes.   I have reviewed the above documentation for accuracy and completeness, and I agree with the above.   Hollice Espy, MD    [facility] [address], [place], [place] [address] [phone]

## 2021-06-02 ENCOUNTER — Ambulatory Visit: Payer: Medicare PPO | Admitting: Urology

## 2021-06-02 ENCOUNTER — Other Ambulatory Visit: Payer: Self-pay

## 2021-06-02 ENCOUNTER — Ambulatory Visit
Admission: RE | Admit: 2021-06-02 | Discharge: 2021-06-02 | Disposition: A | Discharge status: Home / Self Care | Payer: Medicare PPO | Source: Ambulatory Visit | Attending: Family Medicine | Admitting: Family Medicine

## 2021-06-02 ENCOUNTER — Encounter: Payer: Self-pay | Admitting: Urology

## 2021-06-02 VITALS — BP 178/96 | HR 67 | Ht 65.0 in | Wt 160.0 lb

## 2021-06-02 DIAGNOSIS — K7460 Unspecified cirrhosis of liver: Secondary | ICD-10-CM | POA: Diagnosis not present

## 2021-06-02 DIAGNOSIS — Z85528 Personal history of other malignant neoplasm of kidney: Secondary | ICD-10-CM | POA: Diagnosis not present

## 2021-06-02 DIAGNOSIS — N2889 Other specified disorders of kidney and ureter: Secondary | ICD-10-CM | POA: Diagnosis not present

## 2021-06-02 DIAGNOSIS — K769 Liver disease, unspecified: Secondary | ICD-10-CM | POA: Insufficient documentation

## 2021-06-02 DIAGNOSIS — Z8505 Personal history of malignant neoplasm of liver: Secondary | ICD-10-CM | POA: Diagnosis not present

## 2021-06-02 MED ORDER — GADOBUTROL 1 MMOL/ML IV SOLN
7.0000 mL | Freq: Once | INTRAVENOUS | Status: AC | PRN
  Administered 2021-06-02: 7 mL via INTRAVENOUS

## 2021-06-02 NOTE — Telephone Encounter (Addendum)
See liver MRI results note.  Can you change both referrals to urgent? Thank you.

## 2021-06-02 NOTE — Telephone Encounter (Signed)
I have been trying to reach the patient to get them scheduled.  ATC to call them several times 9/27  I was able to reach the patient wife Patsy She states that he has an Urology appt at 1:30 today - could do today after 3pm He will have to have the MRI on 06/03/21 Requested [facility] location

## 2021-06-02 NOTE — Telephone Encounter (Signed)
See other telephone encounter I have been trying to reach the patient to get them scheduled.  ATC to call them several times 9/27

## 2021-06-02 NOTE — Telephone Encounter (Signed)
Pt is scheduled for [facility] today at 5pm, arrive by 4:30 to check in  Patient wife Tessie Fass has been made aware.  Nothing further needed.

## 2021-06-02 NOTE — Addendum Note (Signed)
Addended by: Ria Bush on: 06/02/2021 08:40 PM   Modules accepted: Orders

## 2021-06-03 LAB — MICROSCOPIC EXAMINATION
Bacteria, UA: NONE SEEN
Epithelial Cells (non renal): NONE SEEN /hpf (ref 0–10)
RBC, Urine: NONE SEEN /hpf (ref 0–2)

## 2021-06-03 LAB — URINALYSIS, COMPLETE
Bilirubin, UA: NEGATIVE
Glucose, UA: NEGATIVE
Ketones, UA: NEGATIVE
Leukocytes,UA: NEGATIVE
Nitrite, UA: NEGATIVE
Protein,UA: NEGATIVE
RBC, UA: NEGATIVE
Specific Gravity, UA: 1.025 (ref 1.005–1.030)
Urobilinogen, Ur: 0.2 mg/dL (ref 0.2–1.0)
pH, UA: 5.5 (ref 5.0–7.5)

## 2021-06-03 NOTE — Telephone Encounter (Signed)
I have faxed over the referrals

## 2021-06-07 ENCOUNTER — Telehealth: Payer: Self-pay | Admitting: Family Medicine

## 2021-06-07 NOTE — Telephone Encounter (Signed)
Pt called in to know the status of the referral .Would like a call back [phone]

## 2021-06-08 ENCOUNTER — Telehealth: Payer: Self-pay | Admitting: Urology

## 2021-06-08 ENCOUNTER — Encounter: Payer: Self-pay | Admitting: Family Medicine

## 2021-06-08 DIAGNOSIS — R160 Hepatomegaly, not elsewhere classified: Secondary | ICD-10-CM

## 2021-06-08 DIAGNOSIS — N2889 Other specified disorders of kidney and ureter: Secondary | ICD-10-CM

## 2021-06-08 NOTE — Telephone Encounter (Signed)
Pt  called in to know the status of referral . Would like a call back [phone]

## 2021-06-08 NOTE — Telephone Encounter (Signed)
Called patient to review liver MRI results.  This already been reviewed with him by his PCP.  Patient reports today that he is elected to seek care outside of [facility] health and is preferring [facility].  Follow-up has been arranged at [facility] urology and [place].  He will let us know if he changes mind about receiving care here.  Will cancel tumor board presentation.

## 2021-06-09 NOTE — Telephone Encounter (Signed)
Left message for patient letting him know that we have urgently sent his referrals to Hematology and Neurology and that he can call us back to get the numbers of the locations to call and schedule  I am going to be out of office starting tomorrow 06/10/21 so will be routing to [place] and other referral coordinator Danae Chen to follow up.

## 2021-06-09 NOTE — Telephone Encounter (Signed)
Noted  

## 2021-06-09 NOTE — Telephone Encounter (Signed)
Ernest Bell called in and wanted to know about the referrals due to he hasnt heard anything as of yet.

## 2021-06-09 NOTE — Telephone Encounter (Signed)
Pt has still not heard about scheduling urology or oncology appt with [facility]. Can you call [facility] tomorrow to inquire about appt? Thank you.

## 2021-06-10 NOTE — Telephone Encounter (Signed)
Ok i've cancelled uro and onc referrals. Have kept surgical oncology referral.

## 2021-06-10 NOTE — Telephone Encounter (Signed)
Thank you!  Patient needs CD with recent imaging (RUQ [place], renal abdomen CT, abdomen MRI) to take to [facility] by Monday's appt.  Will send to [facility] to see if xray at [facility] can help with this.  New referral placed for surgical oncology to [facility]. I don't see an option to order GI oncology referral in epic. I previously placed urology and oncology referrals.

## 2021-06-10 NOTE — Telephone Encounter (Signed)
I have pt scheduled on Monday 06/14/21 at 1pm with Dr. Binnie Rail, however based of the DX they said pt needs to be seen my GI oncology. I have made pt aware of this and we need to either send over images via Power share or he needs to take a CD with the images on them. I don't know how to go about doing that any help would be great.

## 2021-06-10 NOTE — Telephone Encounter (Signed)
Patient called and waiting for a return call

## 2021-06-10 NOTE — Telephone Encounter (Signed)
Sending note to [place] referrals and [facility] CMA.

## 2021-06-10 NOTE — Telephone Encounter (Signed)
[place] Night - Client Nonclinical Telephone Record AccessNurse Client [facility] Night - Client Client Site [place] - Night Contact Type Call Who Is Calling Patient / Member / Family / Caregiver Caller Name [place] Phone Number [phone] Call Type Message Only Information Provided Reason for Call Returning a Call from the Office Initial [place] states that he is returning a call to the office. Declined triage. Disp. Time Disposition Final User 06/10/2021 8:07:41 AM General Information Provided Yes Windy Canny Call Closed By: Windy Canny Transaction Date/Time: 06/10/2021 8:05:52 AM (ET)

## 2021-06-10 NOTE — Addendum Note (Signed)
Addended by: Ria Bush on: 06/10/2021 03:12 PM   Modules accepted: Orders

## 2021-06-14 DIAGNOSIS — C220 Liver cell carcinoma: Secondary | ICD-10-CM | POA: Diagnosis not present

## 2021-06-14 DIAGNOSIS — I2510 Atherosclerotic heart disease of native coronary artery without angina pectoris: Secondary | ICD-10-CM | POA: Diagnosis not present

## 2021-06-14 DIAGNOSIS — K7460 Unspecified cirrhosis of liver: Secondary | ICD-10-CM | POA: Diagnosis not present

## 2021-06-14 DIAGNOSIS — K769 Liver disease, unspecified: Secondary | ICD-10-CM | POA: Diagnosis not present

## 2021-06-14 DIAGNOSIS — C649 Malignant neoplasm of unspecified kidney, except renal pelvis: Secondary | ICD-10-CM | POA: Diagnosis not present

## 2021-06-14 DIAGNOSIS — N2889 Other specified disorders of kidney and ureter: Secondary | ICD-10-CM | POA: Diagnosis not present

## 2021-06-14 DIAGNOSIS — I4891 Unspecified atrial fibrillation: Secondary | ICD-10-CM | POA: Diagnosis not present

## 2021-06-14 DIAGNOSIS — K7689 Other specified diseases of liver: Secondary | ICD-10-CM | POA: Diagnosis not present

## 2021-06-15 NOTE — Telephone Encounter (Signed)
We ([place]) are not allowed to obtain CD or make a CD with those images.  Patient will need to contact imaging place [facility]) directly to have them make him copies.   I spoke with Danae Chen and she did say she had communicated this to patient.   Thanks.

## 2021-06-27 DIAGNOSIS — C641 Malignant neoplasm of right kidney, except renal pelvis: Secondary | ICD-10-CM | POA: Insufficient documentation

## 2021-06-30 ENCOUNTER — Telehealth: Payer: Self-pay | Admitting: Internal Medicine

## 2021-06-30 DIAGNOSIS — N2889 Other specified disorders of kidney and ureter: Secondary | ICD-10-CM | POA: Diagnosis not present

## 2021-06-30 NOTE — Telephone Encounter (Signed)
Patient needs to have a kidney removed. He is scheduled to have that procedure done 07/19/21 by Dr. Dimas Millin at [facility].   [facility] would like the patient to have a stress test done asap to clear him for surgery.   Please put in the appropriate orders so the patient can have the necessary testing

## 2021-06-30 NOTE — Telephone Encounter (Signed)
Left message that Dr. Rayann Heman requested that he see an APP next available for pre op clearance and testing needed. Appointment made for 07/08/21 at 3:25 pm. Advised to call back if this appointment did not work for him.

## 2021-06-30 NOTE — Telephone Encounter (Signed)
In response to Dr. Jackalyn Lombard nurse asking if I had a clearance request for this pt, I do not. I will need a clearance to be faxed to our office [phone] fax#

## 2021-06-30 NOTE — Telephone Encounter (Signed)
Spoke to a staff member who transferred me to the nurse working with Dr. Dimas Millin today. No one answered and no voicemail available.   Called back though the patient line and told the triage nurse that we needed a clearance form faxed to our office and that the patient called requesting a stress test to clear his surgery per Dr. Dimas Millin. [place] fax number. Triage nurse will forward message to Dr. Rogelia Rohrer team.

## 2021-07-01 NOTE — Telephone Encounter (Signed)
Left detailed message again with the patient about upcoming appointment made for clearance.

## 2021-07-01 NOTE — Telephone Encounter (Signed)
Left message for Dr. Rogelia Rohrer office to please fax over a clearance request, see previous notes from [facility], Dr. Jackalyn Lombard nurse. Our office will await for clearance form to be faxed to our office fax [phone] ATTN: PRE OP TEAM, before being able to proceed to pre op provider.

## 2021-07-02 NOTE — Telephone Encounter (Signed)
I called and s/w Dr. Rogelia Rohrer office and explained that I needed a clearance request to be faxed to our office for the pre op provider to review for clearance. I gave our fax # [phone]. I will await the clearance request to come to our office. Per woman I did s/w today stated the surgery is set for 07/19/21. According to the pt, see previous notes, states he is having a kidney removed.

## 2021-07-06 HISTORY — PX: NEPHRECTOMY RADICAL: SUR878

## 2021-07-06 NOTE — Telephone Encounter (Signed)
Left message x 2 for Dr. Rogelia Rohrer surgery scheduler to fax over a clearance request. I can see in the chart surgery:  HAND-ASSISTED LAPAROSCOPIC NEPHRECTOMY  Though I do not see any other pertinent information needed. See previous notes that the pt contacted our office and advised [place] that he is having surgery.   I have yet to see a clearance request come over from Dr. Rogelia Rohrer office, though Dr. Dimas Millin may not be requesting cardiac clearance for the pt. Pt has appt with Tommye Standard, [facility] 07/08/21.   I will forward notes to [facility] for upcoming appt as well as FYI to Dr. Rogelia Rohrer office.

## 2021-07-07 NOTE — Telephone Encounter (Signed)
Left a detailed message about appointment 11/3 at 3:35 pm for surgery clearance.

## 2021-07-08 ENCOUNTER — Encounter: Payer: Self-pay | Admitting: Physician Assistant

## 2021-07-08 ENCOUNTER — Ambulatory Visit: Payer: Medicare PPO | Admitting: Physician Assistant

## 2021-07-08 ENCOUNTER — Other Ambulatory Visit: Payer: Self-pay

## 2021-07-08 ENCOUNTER — Encounter: Payer: Self-pay | Admitting: *Deleted

## 2021-07-08 VITALS — BP 140/70 | HR 96 | Ht 65.0 in | Wt 163.8 lb

## 2021-07-08 DIAGNOSIS — Z01818 Encounter for other preprocedural examination: Secondary | ICD-10-CM | POA: Diagnosis not present

## 2021-07-08 DIAGNOSIS — I480 Paroxysmal atrial fibrillation: Secondary | ICD-10-CM

## 2021-07-08 NOTE — Patient Instructions (Signed)
Medication Instructions:  Your physician recommends that you continue on your current medications as directed. Please refer to the Current Medication list given to you today.  *If you need a refill on your cardiac medications before your next appointment, please call your pharmacy*   Lab Work:  [place]   If you have labs (blood work) drawn today and your tests are completely normal, you will receive your results only by: [place] (if you have MyChart) OR A paper copy in the mail If you have any lab test that is abnormal or we need to change your treatment, we will call you to review the results.   Testing/Procedures: Your physician has requested that you have en exercise stress myoview. For further information please visit [email or web address]. Please follow instruction sheet, as given.    Follow-Up: At [facility], you and your health needs are our priority.  As part of our continuing mission to provide you with exceptional heart care, we have created designated Provider Care Teams.  These Care Teams include your primary Cardiologist (physician) and Advanced Practice Providers (APPs -  Physician Assistants and Nurse Practitioners) who all work together to provide you with the care you need, when you need it.  We recommend signing up for the patient portal called "MyChart".  Sign up information is provided on this After Visit Summary.  MyChart is used to connect with patients for Virtual Visits (Telemedicine).  Patients are able to view lab/test results, encounter notes, upcoming appointments, etc.  Non-urgent messages can be sent to your provider as well.   To learn more about what you can do with MyChart, go to [email or web address].    Your next appointment:   1 year(s)  The format for your next appointment:   In Person  Provider:   You will see one of the following Advanced Practice Providers on your designated Care Team:   Tommye Standard, [place] Legrand Como "Jonni Sanger"  Chalmers Cater, [place]  Other Instructions

## 2021-07-08 NOTE — Progress Notes (Signed)
Cardiology Office Note Date:  07/08/2021  Patient ID:  Ernest Bell., DOB [birth year], MRN [id number] PCP:  Ernest Bush, MD  Cardiologist:  Dr. Cephus Bell Electrophysiologist: Dr. Rayann Bell   Chief Complaint:  pre-op  History of Present Illness: Ernest Bell. is a 75 y.o. male with history of restrictive pericarditis s/p pericardectomy in 1969, NASH/cirrhosis, AFib/flutter (hx reports typical flutter ablation 04/28/10, and PVI cryoablation Oct. 2011).  I saw him back in 2019 He Bell today as a work-in/add-on appointment for suspected AFib noted by exam at a well-care RN visit by his insurance company. He last saw Dr. Rayann Bell Oct 2017, at that time he was doing well without symptoms of AFib.  He was off a/c with CHA2DS2Vasc sore of one, his flecainide reduced to 14m BID (also on dilt) with thoughts of stopping it at annual f/u if no AF.   He feels great.  He has been off his flecainide and diltiazem since he saw Dr. ARayann Hemanlast in 2017.  He will appreciate some extra beats on/off some nights, they are brief and not symptomatic with them.  He plays tennis regularly, last night 2 hours.  He is very active, no exertional intolerances, no CP or SOB, no DOE.  He denies any dizziness, near syncope or syncope.  His PMD monitors his lipids that are reported to be good.  He has never had a high BP reading before, and denies any changes in his Ernest history, no new Ernest problems. Ernest health RN visit was a routine check in for his insurance.  He felt well, recalls his BP 130/something, does not recall Ernest HR being mentioned as high or low, just that his rhythm was irregular.  He was asymptomatic and played his tennis afterwards without difficulty.  He has not felt like he has had afib since his ablation. He was in SR w/PACs, no changes were made  He last saw Dr. ARayann Heman8/31/22, feeling well, no symptoms of AF or cardiac concerns, no changes were made, risk score remained one and not on a/c.   No changes were made.  Our office was contacted it seem by Dr. IRogelia Bell 06/30/21, planned for nephrectomy and apparently requesting stress testing be done pre-op. Appt was made for Ernest pt to see APP. It does not seem that we have gotten an official pre-op clearance request or that we have been successful in talking with Ernest patient though messages had been left.  In review of his chart, R renal mass (suspect malignant) with renal sinus invasion, planned for laparoscopic radical nephrectomy.  last echo 2020 LVEF 60-65%, mild-mod MR Last/only stress test 2015, normal  RCRI score is one, 0.9%  Ernest patient feels great. He plays tennis 3 days a week, pickle ball as well, denies any kind of CP, SOB or exertional intolerances. He doesn't think he ever has had AFib again since his last ablation No near syncope or syncope.  Kidney tumor was found incidentally at a routine CT that he does for his NASH   Past Ernest History:  Diagnosis Date   Actinic keratosis    Atrial fib/flutter, transient 04/28/10   typical appearing atrial flutter dx 04/28/10 s/p CTI ablation 10/11   BPH (benign prostatic hyperplasia)    PSA stable, followed by Ernest Bell  CHF (congestive heart failure) (Ernest Bell    Constrictive pericarditis 1969   unknown cause s/p pericardectomy   Coronary artery disease    Depression    on sertraline  DJD (degenerative joint disease)    Gout    Liver cirrhosis secondary to NASH (nonalcoholic steatohepatitis) (Ernest Bell) 2001   ?by liver biopsy   Persistent atrial fibrillation (HCC) 09/15/10   S/P PVI (Cryo) at Ernest Bell   Syncope 1997   recurrent S/P tilt study by Dr. Caryl Bell (like due to dysautonomia)    Past Surgical History:  Procedure Laterality Date   afib abation  09/15/10   Cryo at Ernest Bell ablation at Ernest Bell 04/28/10   Ernest Bell   COLONOSCOPY  02/2000   2 hyperplastic polyps, IBS Ernest Bell)   ESOPHAGOGASTRODUODENOSCOPY  02/2000   [facility] Ernest Bell)    HEMORROIDECTOMY  2007   KNEE SURGERY  2007   he had osteoarthritis with left knee surgery   PERICARDIECTOMY  1969   at Ernest Bell   right [facility]     for osteoarthritis    Current Outpatient Medications  Medication Sig Dispense Refill   diltiazem (CARDIZEM) 30 MG tablet Take by mouth.     gabapentin (NEURONTIN) 100 MG capsule Take 1 capsule (100 mg total) by mouth 3 (three) times daily.     ibuprofen (ADVIL,MOTRIN) 800 MG tablet Take 1 tablet by mouth every 8 (eight) hours as needed for pain.  1   tamsulosin (FLOMAX) 0.4 MG CAPS capsule Take 0.4 mg by mouth 2 (two) times daily.     No current facility-administered medications for this visit.    Allergies:   Penicillins   Social History:  Ernest patient  reports that he has never smoked. He has never used smokeless tobacco. He reports that he does not drink alcohol and does not use drugs.   Family History:  Ernest patient's family history includes Alcohol abuse in his father; Heart disease in his father; Hypertension in his mother.  ROS:  Please see Ernest history of present illness.    All other systems are reviewed and otherwise negative.   PHYSICAL EXAM:  VS:  There were no vitals taken for this visit. BMI: There is no height or weight on file to calculate BMI. Well nourished, well developed, in no acute distress  HEENT: normocephalic, atraumatic  Neck: no JVD, carotid bruits or masses Cardiac:  RRR; no significant murmurs, no rubs, or gallops Lungs:  CTA b/l, no wheezing, rhonchi or rales  Abd: soft, nontender MS: no deformity or atrophy Ext: no edema  Skin: warm and dry, no rash Neuro:  No gross deficits appreciated Psych: euthymic mood, full affect   EKG:  Done today and reviewed by myself  SR, sinus arrhythmia, PACs, T inv III, aVF similar to prior  05/28/2019: TTE IMPRESSIONS   1. Left ventricular ejection fraction, by visual estimation, is 60 to  65%. Ernest left ventricle has normal function. Normal  left ventricular size.  There is no left ventricular hypertrophy.   2. Left ventricular diastolic Doppler parameters are consistent with  pseudonormalization pattern of LV diastolic filling.   3. Global right ventricle has normal systolic function.Ernest right  ventricular size is normal. No increase in right ventricular wall  thickness.   4. Left atrial size was mildly dilated.   5. Right atrial size was normal.   6. Ernest mitral valve is normal in structure. Mild to moderate mitral valve  regurgitation. No evidence of mitral stenosis.   7. Ernest tricuspid valve is normal in structure. Tricuspid valve  regurgitation was not visualized by color flow Doppler.   8. Ernest aortic valve is normal in structure. Aortic valve  regurgitation  was not visualized by color flow Doppler. Structurally normal aortic  valve, with no evidence of sclerosis or stenosis.   9. Ernest pulmonic valve was normal in structure. Pulmonic valve  regurgitation is mild by color flow Doppler.  10. Ernest inferior vena cava is normal in size with greater than 50%  respiratory variability, suggesting right atrial pressure of 3 mmHg.     05/20/16: TTE Study Conclusions - Left ventricle: Ernest cavity size was normal. Wall thickness was   normal. Systolic function was normal. Ernest estimated ejection   fraction was in Ernest range of 60% to 65%. Wall motion was normal;   there were no regional wall motion abnormalities. Ernest study is   not technically sufficient to allow evaluation of LV diastolic   function. - Mitral valve: There was mild to moderate regurgitation. - Left atrium: Ernest atrium was mildly dilated. - Pulmonic valve: There was moderate regurgitation. - Pulmonary arteries: Systolic pressure was within Ernest normal   range.   11/15/2013: stress myoview Impression Exercise Capacity:  Good exercise capacity. BP Response:  Normal blood pressure response. Clinical Symptoms:  No significant symptoms noted. ECG Impression:  No  significant Ernest segment change suggestive of ischemia. Comparison with Prior Nuclear Study: No significant change from previous study   Overall Impression:  Normal stress nuclear study.   LV Ejection Fraction: 65%.  LV Wall Motion:  NL LV Function; NL Wall Motion    Recent Labs: 05/06/2021: ALT 19; BUN 20; Creatinine, Ser 0.89; Hemoglobin 14.6; Platelets 173.0; Potassium 4.1; Sodium 141; TSH 2.66  05/06/2021: Cholesterol 188; HDL 43.40; LDL Cholesterol 121; Total CHOL/HDL Ratio 4; Triglycerides 119.0; VLDL 23.8   CrCl cannot be calculated (Patient's most recent lab result is older than Ernest maximum 21 days allowed.).   Wt Readings from Last 3 Encounters:  06/02/21 160 lb (72.6 kg)  05/05/21 164 lb 12.8 oz (74.8 kg)  04/28/21 162 lb (73.5 kg)     Other studies reviewed: Additional studies/records reviewed today include: summarized above  ASSESSMENT AND PLAN:  1. AFib     Hx of prior ablation     CHA2DS2Vasc is 2 for age      Off AAD and a/c  No symptoms of Afib for years   2. Pre-op Nephrectomy Surgeon's office is requiring stress testing Will get done ASAP next week. Discussed stress testing procedure with Ernest patient, he is agreeable   Disposition: back in a year, sooner if needed.  Current medicines are reviewed at length with Ernest patient today.  Ernest patient did not have any concerns regarding medicines.  Venetia Night, PA-C 07/08/2021 6:42 AM     [facility] [address] [place] [address] [phone] (office)  [phone] (fax)

## 2021-07-09 NOTE — Addendum Note (Signed)
Addended by: Gar Ponto on: 07/09/2021 02:27 PM   Modules accepted: Orders

## 2021-07-09 NOTE — Telephone Encounter (Signed)
Pt was seen by Tommye Standard, [facility] 07/08/21. Will remove from the pre op call back pool.

## 2021-07-12 ENCOUNTER — Telehealth (HOSPITAL_COMMUNITY): Payer: Self-pay | Admitting: *Deleted

## 2021-07-12 NOTE — Telephone Encounter (Signed)
Patient given detailed instructions per Myocardial Perfusion Study Information Sheet for the test on 07/14/21 Patient notified to arrive 15 minutes early and that it is imperative to arrive on time for appointment to keep from having the test rescheduled.  If you need to cancel or reschedule your appointment, please call the office within 24 hours of your appointment. . Patient verbalized understanding. Kirstie Peri

## 2021-07-14 ENCOUNTER — Other Ambulatory Visit: Payer: Self-pay

## 2021-07-14 ENCOUNTER — Ambulatory Visit (HOSPITAL_COMMUNITY): Payer: Medicare PPO | Attending: Cardiovascular Disease

## 2021-07-14 VITALS — Ht 65.0 in | Wt 163.0 lb

## 2021-07-14 DIAGNOSIS — R002 Palpitations: Secondary | ICD-10-CM | POA: Diagnosis not present

## 2021-07-14 DIAGNOSIS — Z01810 Encounter for preprocedural cardiovascular examination: Secondary | ICD-10-CM | POA: Diagnosis not present

## 2021-07-14 DIAGNOSIS — Z01818 Encounter for other preprocedural examination: Secondary | ICD-10-CM

## 2021-07-14 LAB — MYOCARDIAL PERFUSION IMAGING
Angina Index: 0
Duke Treadmill Score: 8
Estimated workload: 10.1
Exercise duration (min): 8 min
Exercise duration (sec): 0 s
LV dias vol: 97 mL (ref 62–150)
LV sys vol: 40 mL
MPHR: 145 {beats}/min
Nuc Stress EF: 59 %
Peak HR: 136 {beats}/min
Percent HR: 93 %
Rest HR: 55 {beats}/min
Rest Nuclear Isotope Dose: 9.1 mCi
SDS: 1
SRS: 0
SSS: 1
ST Depression (mm): 0 mm
Stress Nuclear Isotope Dose: 27.7 mCi
TID: 0.9

## 2021-07-14 MED ORDER — TECHNETIUM TC 99M TETROFOSMIN IV KIT
9.1000 | PACK | Freq: Once | INTRAVENOUS | Status: AC | PRN
  Administered 2021-07-14: 9.1 via INTRAVENOUS
  Filled 2021-07-14: qty 10

## 2021-07-14 MED ORDER — TECHNETIUM TC 99M TETROFOSMIN IV KIT
27.7000 | PACK | Freq: Once | INTRAVENOUS | Status: AC | PRN
  Administered 2021-07-14: 27.7 via INTRAVENOUS
  Filled 2021-07-14: qty 28

## 2021-07-17 DIAGNOSIS — C641 Malignant neoplasm of right kidney, except renal pelvis: Secondary | ICD-10-CM | POA: Diagnosis not present

## 2021-07-17 DIAGNOSIS — Z85828 Personal history of other malignant neoplasm of skin: Secondary | ICD-10-CM | POA: Diagnosis not present

## 2021-07-17 DIAGNOSIS — Z79899 Other long term (current) drug therapy: Secondary | ICD-10-CM | POA: Diagnosis not present

## 2021-07-17 DIAGNOSIS — Z20822 Contact with and (suspected) exposure to covid-19: Secondary | ICD-10-CM | POA: Diagnosis not present

## 2021-07-17 DIAGNOSIS — Z9841 Cataract extraction status, right eye: Secondary | ICD-10-CM | POA: Diagnosis not present

## 2021-07-17 DIAGNOSIS — Z9842 Cataract extraction status, left eye: Secondary | ICD-10-CM | POA: Diagnosis not present

## 2021-07-17 DIAGNOSIS — Z7901 Long term (current) use of anticoagulants: Secondary | ICD-10-CM | POA: Diagnosis not present

## 2021-07-17 DIAGNOSIS — N400 Enlarged prostate without lower urinary tract symptoms: Secondary | ICD-10-CM | POA: Diagnosis not present

## 2021-07-17 DIAGNOSIS — Z880 Allergy status to penicillin: Secondary | ICD-10-CM | POA: Diagnosis not present

## 2021-07-19 HISTORY — PX: NEPHRECTOMY RADICAL: SUR878

## 2021-07-26 DIAGNOSIS — Z466 Encounter for fitting and adjustment of urinary device: Secondary | ICD-10-CM | POA: Diagnosis not present

## 2021-07-27 DIAGNOSIS — N5201 Erectile dysfunction due to arterial insufficiency: Secondary | ICD-10-CM | POA: Diagnosis not present

## 2021-07-27 DIAGNOSIS — R338 Other retention of urine: Secondary | ICD-10-CM | POA: Diagnosis not present

## 2021-07-27 DIAGNOSIS — N401 Enlarged prostate with lower urinary tract symptoms: Secondary | ICD-10-CM | POA: Diagnosis not present

## 2021-07-27 DIAGNOSIS — C649 Malignant neoplasm of unspecified kidney, except renal pelvis: Secondary | ICD-10-CM | POA: Diagnosis not present

## 2021-08-02 DIAGNOSIS — N401 Enlarged prostate with lower urinary tract symptoms: Secondary | ICD-10-CM | POA: Diagnosis not present

## 2021-08-02 DIAGNOSIS — N5201 Erectile dysfunction due to arterial insufficiency: Secondary | ICD-10-CM | POA: Diagnosis not present

## 2021-08-02 DIAGNOSIS — R338 Other retention of urine: Secondary | ICD-10-CM | POA: Diagnosis not present

## 2021-08-09 DIAGNOSIS — R339 Retention of urine, unspecified: Secondary | ICD-10-CM | POA: Diagnosis not present

## 2021-08-09 DIAGNOSIS — N401 Enlarged prostate with lower urinary tract symptoms: Secondary | ICD-10-CM | POA: Diagnosis not present

## 2021-08-11 DIAGNOSIS — M545 Low back pain, unspecified: Secondary | ICD-10-CM | POA: Diagnosis not present

## 2021-08-11 DIAGNOSIS — M79671 Pain in right foot: Secondary | ICD-10-CM | POA: Diagnosis not present

## 2021-08-11 DIAGNOSIS — M79672 Pain in left foot: Secondary | ICD-10-CM | POA: Diagnosis not present

## 2021-08-11 DIAGNOSIS — G8929 Other chronic pain: Secondary | ICD-10-CM | POA: Diagnosis not present

## 2021-08-11 DIAGNOSIS — M792 Neuralgia and neuritis, unspecified: Secondary | ICD-10-CM | POA: Diagnosis not present

## 2021-08-18 DIAGNOSIS — C641 Malignant neoplasm of right kidney, except renal pelvis: Secondary | ICD-10-CM | POA: Diagnosis not present

## 2021-08-24 DIAGNOSIS — B349 Viral infection, unspecified: Secondary | ICD-10-CM | POA: Diagnosis not present

## 2021-08-24 DIAGNOSIS — R339 Retention of urine, unspecified: Secondary | ICD-10-CM | POA: Diagnosis not present

## 2021-08-24 DIAGNOSIS — R059 Cough, unspecified: Secondary | ICD-10-CM | POA: Diagnosis not present

## 2021-08-24 DIAGNOSIS — Z20822 Contact with and (suspected) exposure to covid-19: Secondary | ICD-10-CM | POA: Diagnosis not present

## 2021-08-24 DIAGNOSIS — N401 Enlarged prostate with lower urinary tract symptoms: Secondary | ICD-10-CM | POA: Diagnosis not present

## 2021-08-26 DIAGNOSIS — N401 Enlarged prostate with lower urinary tract symptoms: Secondary | ICD-10-CM | POA: Diagnosis not present

## 2021-08-31 DIAGNOSIS — H1031 Unspecified acute conjunctivitis, right eye: Secondary | ICD-10-CM | POA: Diagnosis not present

## 2021-08-31 DIAGNOSIS — J189 Pneumonia, unspecified organism: Secondary | ICD-10-CM | POA: Diagnosis not present

## 2021-09-13 DIAGNOSIS — K7460 Unspecified cirrhosis of liver: Secondary | ICD-10-CM | POA: Diagnosis not present

## 2021-09-13 DIAGNOSIS — K7689 Other specified diseases of liver: Secondary | ICD-10-CM | POA: Diagnosis not present

## 2021-09-13 DIAGNOSIS — K769 Liver disease, unspecified: Secondary | ICD-10-CM | POA: Diagnosis not present

## 2021-09-13 DIAGNOSIS — Z905 Acquired absence of kidney: Secondary | ICD-10-CM | POA: Diagnosis not present

## 2021-09-13 DIAGNOSIS — I4891 Unspecified atrial fibrillation: Secondary | ICD-10-CM | POA: Diagnosis not present

## 2021-09-13 DIAGNOSIS — I2510 Atherosclerotic heart disease of native coronary artery without angina pectoris: Secondary | ICD-10-CM | POA: Diagnosis not present

## 2021-09-15 DIAGNOSIS — N401 Enlarged prostate with lower urinary tract symptoms: Secondary | ICD-10-CM | POA: Diagnosis not present

## 2021-09-15 DIAGNOSIS — R3914 Feeling of incomplete bladder emptying: Secondary | ICD-10-CM | POA: Diagnosis not present

## 2021-09-15 DIAGNOSIS — R338 Other retention of urine: Secondary | ICD-10-CM | POA: Diagnosis not present

## 2021-09-29 ENCOUNTER — Other Ambulatory Visit
Admission: RE | Admit: 2021-09-29 | Discharge: 2021-09-29 | Disposition: A | Discharge status: Home / Self Care | Payer: Medicare PPO | Source: Ambulatory Visit | Attending: Urology | Admitting: Urology

## 2021-09-29 ENCOUNTER — Other Ambulatory Visit: Payer: Self-pay

## 2021-09-29 DIAGNOSIS — Z85528 Personal history of other malignant neoplasm of kidney: Secondary | ICD-10-CM | POA: Diagnosis not present

## 2021-09-29 DIAGNOSIS — Z7689 Persons encountering health services in other specified circumstances: Secondary | ICD-10-CM | POA: Diagnosis not present

## 2021-09-29 DIAGNOSIS — G629 Polyneuropathy, unspecified: Secondary | ICD-10-CM | POA: Diagnosis not present

## 2021-09-29 DIAGNOSIS — M79675 Pain in left toe(s): Secondary | ICD-10-CM | POA: Diagnosis not present

## 2021-09-29 DIAGNOSIS — R208 Other disturbances of skin sensation: Secondary | ICD-10-CM | POA: Diagnosis not present

## 2021-09-29 DIAGNOSIS — R202 Paresthesia of skin: Secondary | ICD-10-CM | POA: Diagnosis not present

## 2021-09-29 HISTORY — DX: Chronic kidney disease, unspecified: N18.9

## 2021-09-29 HISTORY — DX: Malignant (primary) neoplasm, unspecified: C80.1

## 2021-09-29 NOTE — Patient Instructions (Signed)
Your procedure is scheduled on: Thursday October 07, 2021. Report to Day Surgery inside [facility] 2nd floor, stop by admissions desk before getting on elevator. To find out your arrival time please call [phone] between 1PM - 3PM on Wednesday October 06, 2021.  Remember: Instructions that are not followed completely may result in serious medical risk,  up to and including death, or upon the discretion of your surgeon and anesthesiologist your  surgery may need to be rescheduled.     _X__ 1. Do not eat food after midnight the night before your procedure.                 No chewing gum or hard candies. You may drink clear liquids up to 2 hours                 before you are scheduled to arrive for your surgery- DO not drink clear                 liquids within 2 hours of the start of your surgery.                 Clear Liquids include:  water, apple juice without pulp, clear Gatorade, G2 or                  Gatorade Zero (avoid Red/Purple/Blue), Black Coffee or Tea (Do not add                 anything to coffee or tea).  __X__2.  On the morning of surgery brush your teeth with toothpaste and water, you                may rinse your mouth with mouthwash if you wish.  Do not swallow any toothpaste or mouthwash.     _X__ 3.  No Alcohol for 24 hours before or after surgery.   _X__ 4.  Do Not Smoke or use e-cigarettes For 24 Hours Prior to Your Surgery.                 Do not use any chewable tobacco products for at least 6 hours prior to                 Surgery.  _X__  5.  Do not use any recreational drugs (marijuana, cocaine, heroin, ecstasy, MDMA or other)                For at least one week prior to your surgery.  Combination of these drugs with anesthesia                May have life threatening results.  ____  6.  Bring all medications with you on the day of surgery if instructed.   __X__  7.  Notify your doctor if there is any change in your medical  condition      (cold, fever, infections).     Do not wear jewelry, make-up, hairpins, clips or nail polish. Do not wear lotions, powders, or perfumes. You may wear deodorant. Do not shave 48 hours prior to surgery. Men may shave face and neck. Do not bring valuables to the hospital.    [facility] is not responsible for any belongings or valuables.  Contacts, dentures or bridgework may not be worn into surgery. Leave your suitcase in the car. After surgery it may be brought to your room. For patients admitted to the hospital, discharge time is determined  by your treatment team.   Patients discharged the day of surgery will not be allowed to drive home.   Make arrangements for someone to be with you for the first 24 hours of your Same Day Discharge.  __X__ Take these medicines the morning of surgery with A SIP OF WATER:    1. diltiazem (CARDIZEM) 30 MG  2. gabapentin (NEURONTIN) 300 MG  3. tamsulosin (FLOMAX) 0.4 MG  4.  5.  6.  ____ Fleet Enema (as directed)   ____ Use CHG Soap (or wipes) as directed  ____ Use Benzoyl Peroxide Gel as instructed  ____ Use inhalers on the day of surgery  ____ Stop metformin 2 days prior to surgery    ____ Take 1/2 of usual insulin dose the night before surgery. No insulin the morning          of surgery.   ____ Call your PCP, cardiologist, or Pulmonologist if taking Coumadin/Plavix/aspirin and ask when to stop before your surgery.   __X__ One Week prior to surgery- Stop Anti-inflammatories such as Ibuprofen, Aleve, Advil, Motrin, meloxicam (MOBIC), diclofenac, etodolac, ketorolac, Toradol, Daypro, piroxicam, Goody's or BC powders. OK TO USE TYLENOL IF NEEDED   __X__ Do not start any vitamins and or herbal supplements until after surgery.    ____ Bring C-Pap to the hospital.    If you have any questions regarding your pre-procedure instructions,  Please call Pre-admit Testing at [phone]

## 2021-10-05 DIAGNOSIS — M21161 Varus deformity, not elsewhere classified, right knee: Secondary | ICD-10-CM | POA: Diagnosis not present

## 2021-10-05 DIAGNOSIS — M1711 Unilateral primary osteoarthritis, right knee: Secondary | ICD-10-CM | POA: Diagnosis not present

## 2021-10-06 NOTE — Anesthesia Preprocedure Evaluation (Addendum)
Anesthesia Evaluation  Patient identified by MRN, date of birth, ID band Patient awake    Reviewed: Allergy & Precautions, NPO status , Patient's Chart, lab work & pertinent test results  Airway Mallampati: II  TM Distance: >3 FB Neck ROM: full    Dental no notable dental hx.    Pulmonary neg pulmonary ROS,    Pulmonary exam normal        Cardiovascular Exercise Tolerance: Good + dysrhythmias (h/o Afib s/p PVI 2012) Atrial Fibrillation  Rhythm:Irregular Rate:Tachycardia  Tachycardia this morning. Asymptomatic. EKG sinus tach. 500cc Fluid with no change. BP elevated.   Constrictive pericarditis s/p pericardectomy 1969  Myocardial Perfusion Scan 11/22:   The study is normal. Findings are consistent with no prior ischemia and no prior myocardial infarction. The study is low risk.   No ST deviation was noted.   LV perfusion is normal. There is no evidence of ischemia. There is no evidence of infarction.   Left ventricular function is normal. Nuclear stress EF: 59 %. The left ventricular ejection fraction is normal (55-65%). End diastolic cavity size is normal. End systolic cavity size is normal.   Prior study available for comparison from 11/15/2013. No changes compared to prior study.     Neuro/Psych  Neuromuscular disease (Neuropathy) negative psych ROS   GI/Hepatic negative GI ROS, (+) Cirrhosis  (cirrhosis 2/2 NASH )      ,   Endo/Other  negative endocrine ROS  Renal/GU Renal diseaseS/p nephrectomy 2022   ENLARGED PROSTATE with need to intermittent cath due to retention    Musculoskeletal  (+) Arthritis ,   Abdominal Normal abdominal exam  (+)   Peds  Hematology negative hematology ROS (+)   Anesthesia Other Findings Past Medical History: No date: Actinic keratosis 04/28/2010: Atrial fib/flutter, transient     Comment:  typical appearing atrial flutter dx 04/28/10 s/p CTI               ablation  10/11 No date: BPH (benign prostatic hyperplasia)     Comment:  PSA stable, followed by Dr. Eliberto Ivory No date: Cancer ([place]) No date: CHF (congestive heart failure) ([place]) No date: Chronic kidney disease 1969: Constrictive pericarditis     Comment:  unknown cause s/p pericardectomy No date: Coronary artery disease No date: Depression     Comment:  on sertraline No date: DJD (degenerative joint disease) No date: Gout 2001: Liver cirrhosis secondary to NASH (nonalcoholic  steatohepatitis) ([place])     Comment:  by liver biopsy 09/15/2010: Persistent atrial fibrillation ([place])     Comment:  S/P PVI (Cryo) at [place]: Syncope     Comment:  recurrent S/P tilt study by Dr. [name] (like due to               dysautonomia)  Past Surgical History: 09/15/2010: afib abation     Comment:  Cryo at [place], [place] ablation at [facility] 04/28/10 1995: CHOLECYSTECTOMY 02/2000: COLONOSCOPY     Comment:  2 hyperplastic polyps, [name] 02/2000: ESOPHAGOGASTRODUODENOSCOPY     Comment:  [name] 2007: HEMORROIDECTOMY 2007: KNEE SURGERY     Comment:  he had osteoarthritis with left knee surgery 07/2021: nephrectomy; Right     Comment:  Renal Mass on Right Kidney, surgery at [place]: PERICARDIECTOMY     Comment:  at [facility] No date: right thumb surger     Comment:  for osteoarthritis     Reproductive/Obstetrics negative OB ROS  Anesthesia Physical Anesthesia Plan  ASA: 2  Anesthesia Plan: General ETT   Post-op Pain Management:    Induction: Intravenous  PONV Risk Score and Plan: Ondansetron and Dexamethasone  Airway Management Planned:   Additional Equipment:   Intra-op Plan:   Post-operative Plan: Extubation in OR  Informed Consent: I have reviewed the patients History and Physical, chart, labs and discussed the procedure including the risks, benefits and alternatives for the proposed anesthesia with the  patient or authorized representative who has indicated his/her understanding and acceptance.     Dental Advisory Given and Dental advisory given  Plan Discussed with: Anesthesiologist, CRNA and Surgeon  Anesthesia Plan Comments:        Anesthesia Quick Evaluation

## 2021-10-06 NOTE — H&P (Signed)
Ernest Bell, ROD MEDICAL RECORD NO: [id number] ACCOUNT NO: [id number] DATE OF BIRTH: August 20, 1946 FACILITY: [facility] LOCATION: [facility] PHYSICIAN: Otelia Limes. Yves Dill, MD  History and Physical   DATE OF ADMISSION: 10/07/2021  DATE OF SURGERY:  02/02.  CHIEF COMPLAINT:  Urinary retention.  HISTORY OF PRESENT ILLNESS:  The patient is a 76 year old white male who had right radical nephrectomy at [facility] 10/14th.  He developed postoperative retention and has not been able to void since that time, even with treatment using tamsulosin  and tadalafil.  He has been performing intermittent self-catheterization.  Evaluation in the office included prostate ultrasound with an estimated prostate size of 69.6 mL with lateral lobe hypertrophy.  He also underwent cystoscopy that revealed  multiple bladder diverticula and a heavily trabeculated bladder.  He had a 4 cm prostatic urethral length with lateral lobe obstruction.  Most recent PSA was 2.8 ng/mL on 03/10/2021.  He comes in now for photovaporization of the prostate with the  GreenLight laser.  PAST MEDICAL HISTORY:  ALLERGIES:  No known drug allergies.  CURRENT MEDICATIONS:  Diltiazem, gabapentin, ibuprofen, tamsulosin, and tadalafil.  PAST SURGICAL HISTORY: 1.  Cryoablation for atrial fibrillation, 2011. 2.  Cholecystectomy, 1996. 3.  Colonoscopy. 4.  Upper endoscopy. 5.  Hemorrhoidectomy. 6.  Left knee surgery. 7.  Pericardiectomy in 1969. 8.  Bilateral carpal tunnel repair, 2002. 9.  TUMT of the prostate, 2017. 10.  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 11.  Coronary artery stent placement . 12.  Radical nephrectomy, 06/2021.  PAST AND CURRENT MEDICAL CONDITIONS. 1.  Coronary artery disease. 2.  Atrial fibrillation. 3.  History of renal cell carcinoma. 4.  Congestive heart failure. 5.  Depression. 6.  Degenerative joint disease. 7.  History of liver cirrhosis. 8.  BPH. 9.  Erectile dysfunction.  REVIEW OF  SYSTEMS:  The patient denies chest pain, shortness of breath, diabetes or hypertension.  FAMILY HISTORY:  Father died of heart disease at age 85.  There is no family history of urologic disease or urological malignancy.  PHYSICAL EXAMINATION: VITAL SIGNS:  Height was 5 feet 4 inches, weight 158 pounds, BMI 27. GENERAL:  Well-nourished white male in no acute distress. HEENT:  Sclerae were clear.  Pupils were equally round, reactive to light and accommodation.  Extraocular movements were intact. NECK:  No palpable cervical adenopathy.  No audible carotid bruits. PULMONARY:  Lungs clear to auscultation. CARDIOVASCULAR:  Regular rhythm and rate without audible murmurs. ABDOMEN:  Soft, nontender abdomen. GENITOURINARY:  Circumcised.  Testes smooth, nontender, 18 mL size each. RECTAL EXAM:  Greater than 50 grams, smooth, nontender prostate. NEUROMUSCULAR:  Alert and oriented x3.  IMPRESSION:  Urinary retention due to significant benign prostatic hyperplasia.  PLAN: Photovaporization of the prostate with a GreenLight laser.   NIK D: 09/30/2021 5:45:08 pm T: 09/30/2021 6:09:00 pm  JOB: [id number]/ [id number]

## 2021-10-07 ENCOUNTER — Ambulatory Visit
Admission: RE | Admit: 2021-10-07 | Discharge: 2021-10-07 | Disposition: A | Discharge status: Home / Self Care | Payer: Medicare PPO | Attending: Urology | Admitting: Urology

## 2021-10-07 ENCOUNTER — Other Ambulatory Visit: Payer: Self-pay

## 2021-10-07 ENCOUNTER — Encounter: Payer: Self-pay | Admitting: Urology

## 2021-10-07 ENCOUNTER — Ambulatory Visit: Payer: Medicare PPO | Admitting: Urgent Care

## 2021-10-07 ENCOUNTER — Encounter
Admission: RE | Disposition: A | Discharge status: Home / Self Care | Payer: Self-pay | Source: Home / Self Care | Attending: Urology

## 2021-10-07 DIAGNOSIS — N323 Diverticulum of bladder: Secondary | ICD-10-CM | POA: Insufficient documentation

## 2021-10-07 DIAGNOSIS — M1990 Unspecified osteoarthritis, unspecified site: Secondary | ICD-10-CM | POA: Diagnosis not present

## 2021-10-07 DIAGNOSIS — R338 Other retention of urine: Secondary | ICD-10-CM | POA: Diagnosis not present

## 2021-10-07 DIAGNOSIS — N9989 Other postprocedural complications and disorders of genitourinary system: Secondary | ICD-10-CM | POA: Diagnosis not present

## 2021-10-07 DIAGNOSIS — Z905 Acquired absence of kidney: Secondary | ICD-10-CM | POA: Insufficient documentation

## 2021-10-07 DIAGNOSIS — N400 Enlarged prostate without lower urinary tract symptoms: Secondary | ICD-10-CM | POA: Diagnosis not present

## 2021-10-07 DIAGNOSIS — I4891 Unspecified atrial fibrillation: Secondary | ICD-10-CM | POA: Insufficient documentation

## 2021-10-07 DIAGNOSIS — Z01818 Encounter for other preprocedural examination: Secondary | ICD-10-CM | POA: Diagnosis not present

## 2021-10-07 DIAGNOSIS — N138 Other obstructive and reflux uropathy: Secondary | ICD-10-CM | POA: Diagnosis not present

## 2021-10-07 DIAGNOSIS — N401 Enlarged prostate with lower urinary tract symptoms: Secondary | ICD-10-CM | POA: Diagnosis not present

## 2021-10-07 DIAGNOSIS — G629 Polyneuropathy, unspecified: Secondary | ICD-10-CM | POA: Diagnosis not present

## 2021-10-07 DIAGNOSIS — N320 Bladder-neck obstruction: Secondary | ICD-10-CM | POA: Diagnosis not present

## 2021-10-07 DIAGNOSIS — N3289 Other specified disorders of bladder: Secondary | ICD-10-CM | POA: Insufficient documentation

## 2021-10-07 HISTORY — PX: GREEN LIGHT LASER TURP (TRANSURETHRAL RESECTION OF PROSTATE: SHX6260

## 2021-10-07 SURGERY — GREEN LIGHT LASER TURP (TRANSURETHRAL RESECTION OF PROSTATE
Anesthesia: General

## 2021-10-07 SURGERY — GREEN LIGHT LASER TURP (TRANSURETHRAL RESECTION OF PROSTATE
Anesthesia: Choice

## 2021-10-07 MED ORDER — CIPROFLOXACIN HCL 500 MG PO TABS: 500.0000 mg | ORAL_TABLET | Freq: Two times a day (BID) | ORAL | 0 refills | Status: DC

## 2021-10-07 MED ORDER — SODIUM CHLORIDE 0.9 % IV BOLUS
1000.0000 mL | Freq: Once | INTRAVENOUS | Status: AC
  Administered 2021-10-07: 1000 mL via INTRAVENOUS

## 2021-10-07 MED ORDER — PROMETHAZINE HCL 25 MG/ML IJ SOLN: 6.2500 mg | INTRAMUSCULAR | Status: DC | PRN

## 2021-10-07 MED ORDER — DOCUSATE SODIUM 100 MG PO CAPS: 200.0000 mg | ORAL_CAPSULE | Freq: Two times a day (BID) | ORAL | 3 refills | Status: DC

## 2021-10-07 MED ORDER — LIDOCAINE HCL URETHRAL/MUCOSAL 2 % EX GEL
CUTANEOUS | Status: AC
  Filled 2021-10-07: qty 10

## 2021-10-07 MED ORDER — FENTANYL CITRATE (PF) 100 MCG/2ML IJ SOLN: 25.0000 ug | INTRAMUSCULAR | Status: DC | PRN

## 2021-10-07 MED ORDER — DEXAMETHASONE SODIUM PHOSPHATE 10 MG/ML IJ SOLN
INTRAMUSCULAR | Status: DC | PRN
  Administered 2021-10-07: 10 mg via INTRAVENOUS

## 2021-10-07 MED ORDER — ORAL CARE MOUTH RINSE: 15.0000 mL | Freq: Once | OROMUCOSAL | Status: AC

## 2021-10-07 MED ORDER — CHLORHEXIDINE GLUCONATE 0.12 % MT SOLN: 15.0000 mL | Freq: Once | OROMUCOSAL | Status: AC

## 2021-10-07 MED ORDER — PHENYLEPHRINE HCL (PRESSORS) 10 MG/ML IV SOLN
INTRAVENOUS | Status: DC | PRN
  Administered 2021-10-07 (×2): 160 ug via INTRAVENOUS

## 2021-10-07 MED ORDER — CEFAZOLIN SODIUM-DEXTROSE 1-4 GM/50ML-% IV SOLN
INTRAVENOUS | Status: AC
  Filled 2021-10-07: qty 50

## 2021-10-07 MED ORDER — FENTANYL CITRATE (PF) 100 MCG/2ML IJ SOLN
INTRAMUSCULAR | Status: DC | PRN
  Administered 2021-10-07 (×2): 50 ug via INTRAVENOUS

## 2021-10-07 MED ORDER — OXYCODONE HCL 5 MG/5ML PO SOLN: 5.0000 mg | Freq: Once | ORAL | Status: AC | PRN

## 2021-10-07 MED ORDER — ROCURONIUM BROMIDE 100 MG/10ML IV SOLN
INTRAVENOUS | Status: DC | PRN
  Administered 2021-10-07: 50 mg via INTRAVENOUS

## 2021-10-07 MED ORDER — PROPOFOL 10 MG/ML IV BOLUS
INTRAVENOUS | Status: AC
  Filled 2021-10-07: qty 20

## 2021-10-07 MED ORDER — LIDOCAINE HCL (CARDIAC) PF 100 MG/5ML IV SOSY
PREFILLED_SYRINGE | INTRAVENOUS | Status: DC | PRN
  Administered 2021-10-07: 60 mg via INTRAVENOUS

## 2021-10-07 MED ORDER — CHLORHEXIDINE GLUCONATE 0.12 % MT SOLN
OROMUCOSAL | Status: AC
  Administered 2021-10-07: 15 mL via OROMUCOSAL
  Filled 2021-10-07: qty 15

## 2021-10-07 MED ORDER — FAMOTIDINE 20 MG PO TABS
ORAL_TABLET | ORAL | Status: AC
  Administered 2021-10-07: 20 mg via ORAL
  Filled 2021-10-07: qty 1

## 2021-10-07 MED ORDER — ONDANSETRON HCL 4 MG/2ML IJ SOLN
INTRAMUSCULAR | Status: AC
  Filled 2021-10-07: qty 2

## 2021-10-07 MED ORDER — FENTANYL CITRATE (PF) 100 MCG/2ML IJ SOLN
INTRAMUSCULAR | Status: AC
  Filled 2021-10-07: qty 2

## 2021-10-07 MED ORDER — PROPOFOL 10 MG/ML IV BOLUS
INTRAVENOUS | Status: DC | PRN
  Administered 2021-10-07: 150 mg via INTRAVENOUS

## 2021-10-07 MED ORDER — OXYCODONE HCL 5 MG PO TABS: 5.0000 mg | ORAL_TABLET | Freq: Once | ORAL | Status: AC | PRN

## 2021-10-07 MED ORDER — SUCCINYLCHOLINE CHLORIDE 200 MG/10ML IV SOSY
PREFILLED_SYRINGE | INTRAVENOUS | Status: AC
  Filled 2021-10-07: qty 10

## 2021-10-07 MED ORDER — VASOPRESSIN 20 UNIT/ML IV SOLN
INTRAVENOUS | Status: DC | PRN
Start: 2021-10-07 — End: 2021-10-07
  Administered 2021-10-07 (×2): 2 [IU] via INTRAVENOUS

## 2021-10-07 MED ORDER — GLYCOPYRROLATE 0.2 MG/ML IJ SOLN
INTRAMUSCULAR | Status: AC
  Filled 2021-10-07: qty 1

## 2021-10-07 MED ORDER — STERILE WATER FOR IRRIGATION IR SOLN
Status: DC | PRN
Start: 2021-10-07 — End: 2021-10-07
  Administered 2021-10-07: 500 mL

## 2021-10-07 MED ORDER — PHENYLEPHRINE HCL-NACL 20-0.9 MG/250ML-% IV SOLN
INTRAVENOUS | Status: AC
  Filled 2021-10-07: qty 250

## 2021-10-07 MED ORDER — ROCURONIUM BROMIDE 10 MG/ML (PF) SYRINGE
PREFILLED_SYRINGE | INTRAVENOUS | Status: AC
  Filled 2021-10-07: qty 10

## 2021-10-07 MED ORDER — SODIUM CHLORIDE 0.9 % IV SOLN: INTRAVENOUS | Status: DC

## 2021-10-07 MED ORDER — OXYCODONE HCL 5 MG PO TABS
ORAL_TABLET | ORAL | Status: AC
  Administered 2021-10-07: 5 mg via ORAL
  Filled 2021-10-07: qty 1

## 2021-10-07 MED ORDER — CEFAZOLIN SODIUM-DEXTROSE 1-4 GM/50ML-% IV SOLN
1.0000 g | Freq: Once | INTRAVENOUS | Status: AC
  Administered 2021-10-07: 1 g via INTRAVENOUS

## 2021-10-07 MED ORDER — DEXAMETHASONE SODIUM PHOSPHATE 10 MG/ML IJ SOLN
INTRAMUSCULAR | Status: AC
  Filled 2021-10-07: qty 1

## 2021-10-07 MED ORDER — FAMOTIDINE 20 MG PO TABS: 20.0000 mg | ORAL_TABLET | Freq: Once | ORAL | Status: AC

## 2021-10-07 MED ORDER — ONDANSETRON HCL 4 MG/2ML IJ SOLN
INTRAMUSCULAR | Status: DC | PRN
  Administered 2021-10-07: 4 mg via INTRAVENOUS

## 2021-10-07 MED ORDER — VASOPRESSIN 20 UNIT/ML IV SOLN
INTRAVENOUS | Status: AC
  Filled 2021-10-07: qty 1

## 2021-10-07 MED ORDER — ACETAMINOPHEN 10 MG/ML IV SOLN: 1000.0000 mg | Freq: Once | INTRAVENOUS | Status: DC | PRN

## 2021-10-07 MED ORDER — LIDOCAINE HCL (PF) 2 % IJ SOLN
INTRAMUSCULAR | Status: AC
  Filled 2021-10-07: qty 5

## 2021-10-07 MED ORDER — URIBEL 118 MG PO CAPS: 1.0000 | ORAL_CAPSULE | Freq: Four times a day (QID) | ORAL | 3 refills | Status: DC | PRN

## 2021-10-07 MED ORDER — BELLADONNA ALKALOIDS-OPIUM 16.2-60 MG RE SUPP
RECTAL | Status: AC
  Filled 2021-10-07: qty 1

## 2021-10-07 MED ORDER — ESMOLOL HCL 100 MG/10ML IV SOLN
INTRAVENOUS | Status: DC | PRN
  Administered 2021-10-07: 20 mg via INTRAVENOUS

## 2021-10-07 MED ORDER — EPHEDRINE SULFATE (PRESSORS) 50 MG/ML IJ SOLN
INTRAMUSCULAR | Status: DC | PRN
  Administered 2021-10-07 (×2): 10 mg via INTRAVENOUS
  Administered 2021-10-07: 5 mg via INTRAVENOUS

## 2021-10-07 MED ORDER — SUGAMMADEX SODIUM 200 MG/2ML IV SOLN
INTRAVENOUS | Status: DC | PRN
  Administered 2021-10-07: 200 mg via INTRAVENOUS

## 2021-10-07 MED ORDER — EPHEDRINE 5 MG/ML INJ
INTRAVENOUS | Status: AC
  Filled 2021-10-07: qty 5

## 2021-10-07 MED ORDER — SODIUM CHLORIDE 0.9 % IR SOLN
Status: DC | PRN
  Administered 2021-10-07: 24000 mL

## 2021-10-07 SURGICAL SUPPLY — 29 items
ADAPTER IRRIG TUBE 2 SPIKE SOL (ADAPTER) ×2 IMPLANT
ADPR TBG 2 SPK PMP STRL ASCP (ADAPTER)
BAG DRN RND TRDRP ANRFLXCHMBR (UROLOGICAL SUPPLIES) ×1
BAG URINE DRAIN 2000ML AR STRL (UROLOGICAL SUPPLIES) ×2 IMPLANT
CATH FOLEY 2WAY  5CC 20FR SIL (CATHETERS) ×2
CATH FOLEY 2WAY 5CC 20FR SIL (CATHETERS) IMPLANT
CYSTOSCOPE CON FLOW (MISCELLANEOUS) ×1 IMPLANT
GAUZE 4X4 16PLY ~~LOC~~+RFID DBL (SPONGE) ×2 IMPLANT
GLOVE SURG ENC MOIS LTX SZ7.5 (GLOVE) ×2 IMPLANT
GOWN STRL REUS W/ TWL LRG LVL3 (GOWN DISPOSABLE) ×1 IMPLANT
GOWN STRL REUS W/ TWL XL LVL3 (GOWN DISPOSABLE) ×1 IMPLANT
GOWN STRL REUS W/TWL LRG LVL3 (GOWN DISPOSABLE) ×2
GOWN STRL REUS W/TWL XL LVL3 (GOWN DISPOSABLE) ×2
Greenlight Laser Fiber ×1 IMPLANT
IV NS 1000ML (IV SOLUTION) ×2
IV NS 1000ML BAXH (IV SOLUTION) ×1 IMPLANT
IV NS IRRIG 3000ML ARTHROMATIC (IV SOLUTION) ×12 IMPLANT
IV SET PRIMARY 15D 139IN B9900 (IV SETS) ×2 IMPLANT
KIT TURNOVER CYSTO (KITS) ×2 IMPLANT
LASER GREENLIGHT XPS PROCEDURE (MISCELLANEOUS) ×1 IMPLANT
LASER GRNLGT MOXY FIBER 750UM (MISCELLANEOUS) ×1 IMPLANT
MANIFOLD NEPTUNE II (INSTRUMENTS) ×2 IMPLANT
PACK CYSTO AR (MISCELLANEOUS) ×2 IMPLANT
SET IRRIG Y TYPE TUR BLADDER L (SET/KITS/TRAYS/PACK) ×2 IMPLANT
SURGILUBE 2OZ TUBE FLIPTOP (MISCELLANEOUS) ×2 IMPLANT
SYR TOOMEY IRRIG 70ML (MISCELLANEOUS) ×2
SYRINGE TOOMEY IRRIG 70ML (MISCELLANEOUS) ×1 IMPLANT
WATER STERILE IRR 1000ML POUR (IV SOLUTION) ×2 IMPLANT
WATER STERILE IRR 500ML POUR (IV SOLUTION) ×2 IMPLANT

## 2021-10-07 NOTE — Transfer of Care (Signed)
Immediate Anesthesia Transfer of Care Note  Patient: Ernest Bell.  Procedure(s) Performed: GREEN LIGHT LASER TURP (TRANSURETHRAL RESECTION OF PROSTATE)  Patient Location: PACU  Anesthesia Type:General  Level of Consciousness: sedated  Airway & Oxygen Therapy: Patient Spontanous Breathing and Patient connected to face mask oxygen  Post-op Assessment: Report given to RN and Post -op Vital signs reviewed and stable  Post vital signs: Reviewed  Last Vitals:  Vitals Value Taken Time  BP 113/62 10/07/21 0935  Temp    Pulse 53 10/07/21 0936  Resp 9 10/07/21 0936  SpO2 100 % 10/07/21 0936  Vitals shown include unvalidated device data.  Last Pain:  Vitals:   10/07/21 0630  TempSrc: Temporal  PainSc: 0-No pain         Complications: No notable events documented.

## 2021-10-07 NOTE — Op Note (Signed)
Preoperative diagnosis: 1.  Urinary retention (R33.9)                                           2.  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 Yves Dill MD  Anesthesia: General  Indications:76 year old white male who had right radical nephrectomy at [facility] 06/18/21.  He developed postoperative retention and has not been able to void since that time, even with treatment using tamsulosin  and tadalafil.  He has been performing intermittent self-catheterization.  Evaluation in the office included prostate ultrasound with an estimated prostate size of 69.6 mL with lateral lobe hypertrophy.  He also underwent cystoscopy that revealed  multiple bladder diverticula and a heavily trabeculated bladder.  He had a 4 cm prostatic urethral length with lateral lobe obstruction.  Most recent PSA was 2.8 ng/mL on 03/10/2021.  He comes in now for photovaporization of the prostate with the  GreenLight laser.See the history and physical also. After informed consent the above procedure(s) were requested     Technique and findings:  After adequate general anesthesia been obtained patient was placed into dorsal lithotomy position and the perineum was prepped and draped in the usual fashion.  The 21 French laser scope was coupled with the camera and visually advanced into the bladder.  The bladder was heavily trabeculated with small diverticula and cellules present.  No bladder tumors were identified.  Both ureteral orifices were normally situated on the trigone.  Lateral lobe and median lobe prostatic hypertrophy was identified.  The greenlight XPS laser fiber was introduced through the scope and set at 72 W.  The bladder neck tissue was then vaporized.  Power was then increased up to 120 W and obstructive tissue from the bladder neck to the Vero montanum was vaporized.  The  power was then further increased to 180 W and remaining obstructive tissue vaporized.  The bleeders were controlled with the coagulative setting.  The laser scope was then removed and 10 cc of viscous Xylocaine instilled within the urethra and the bladder.  A 20 French silicone catheter was placed and irrigated until clear.  B&O suppository was placed.  Estimated blood loss was 100 cc.  The procedure was then terminated and patient transferred to the recovery room in stable condition.

## 2021-10-07 NOTE — Anesthesia Procedure Notes (Signed)
Procedure Name: Intubation Date/Time: 10/07/2021 7:44 AM Performed by: Rolla Plate, CRNA Pre-anesthesia Checklist: Patient identified, Patient being monitored, Timeout performed, Emergency Drugs available and Suction available Patient Re-evaluated:Patient Re-evaluated prior to induction Oxygen Delivery Method: Circle system utilized Preoxygenation: Pre-oxygenation with 100% oxygen Induction Type: IV induction Ventilation: Mask ventilation without difficulty Laryngoscope Size: McGraph and 4 Grade View: Grade I Tube type: Oral Tube size: 7.5 mm Number of attempts: 1 Airway Equipment and Method: Stylet and Video-laryngoscopy Placement Confirmation: ETT inserted through vocal cords under direct vision, positive ETCO2 and breath sounds checked- equal and bilateral Secured at: 23 cm Tube secured with: Tape Dental Injury: Teeth and Oropharynx as per pre-operative assessment

## 2021-10-07 NOTE — Discharge Instructions (Addendum)
[place] Bell Laser Prostate Treatment, Care After This sheet gives you information about how to care for yourself after your procedure. Your health care provider may also give you more specific instructions. If you have problems or questions, contact your health care provider. What can I expect after the procedure? After the procedure, it is common to have: Swelling and discomfort around your urethra. The opening of the urethra is at the end of the penis. Blood in your urine. This should go away after a few days. Trouble urinating or sudden need to urinate (urgency). These problems should get better over time. You may continue to have a thin tube (catheter) inserted into your urethra to help drain your urine from your bladder for a few days after the procedure. Follow these instructions at home: Medicines Take over-the-counter and prescription medicines only as told by your health care provider. If you were prescribed an antibiotic medicine, take it as told by your health care provider. Do not stop taking the antibiotic even if you start to feel better. Bathing Do not take baths, swim, or use a hot tub until your health care provider approves. Ask your health care provider if you may take showers. You may only be allowed to take sponge baths. Activity Do not drive for 24 hours if you were given a medicine to help you relax (sedative) during your procedure. Do not drive or use heavy machinery while taking prescription pain medicine. Ask your health care provider what activities are safe for you. Most people can return to normal activities within a few days. Do not have sex or engage in sexual activity until your health care provider approves. Do not lift anything that is heavier than 10 lb (4.5 kg), or the limit that you are told, until your health care provider says that it is safe. General instructions    If you have a urinary catheter, care for it as told by your health care provider. This may  include: Washing your hands before and after touching the catheter. Emptying your drainage bag when it is ?- full, or emptying it at least 2-3 times a day. Keeping the area around the catheter clean and dry. Avoiding any bends or breaks in the catheter. Keeping air out of the catheter. Making sure that the catheter is not placed under water. Do not use any products that contain nicotine or tobacco, such as cigarettes and e-cigarettes. If you need help quitting, ask your health care provider. Drink enough fluid to keep your urine pale yellow. Keep all follow-up visits as told by your health care provider. This is important. If you will be going home right after the procedure, plan to have a responsible adult with you for 24 hours. Contact a health care provider if: You have trouble: Having a bowel movement. Getting an erection. You have swelling around your urethra and it gets worse. You have blood in your urine for more than 2 days after the procedure. You have pain or burning when you urinate, or other problems that do not go away or cause discomfort. You have problems with your catheter or your catheter is blocked. You have a fever. You have nausea or you vomit. You have swelling in your legs. Get help right away if: Your urine has blood clots in it. Your urine is dark red. You cannot urinate after your catheter is removed. You have blood in your stool. You have severe pain that does not get better with medicine. You have shortness of  breath. Summary After the procedure, it is common to have swelling and discomfort around your urethra and blood in your urine for a few days. Some men may have problems urinating after this procedure. These problems should go away after a few days. If you have pain or burning while urinating, contact your health care provider. If you have a catheter after this procedure, care for it as told by your health care provider. If you have severe pain, dark  red urine, or urine with blood clots, get medical help right away. This information is not intended to replace advice given to you by your health care provider. Make sure you discuss any questions you have with your health care provider. Document Revised: 12/02/2020 Document Reviewed: 04/30/2020 Elsevier Patient Education  [place].     Indwelling Urinary Catheter Insertion, Care After This sheet gives you information about how to care for yourself after your procedure. Your health care provider may also give you more specific instructions. If you have problems or questions, contact your health care provider. What can I expect after the procedure? After the procedure, it is common to have: Slight discomfort around your urethra where the catheter enters your body. Follow these instructions at home: General instructions Keep the drainage bag at or below the level of your bladder. By doing this, your urine can only drain out instead of going back into your body. Secure the catheter tubing and drainage bag to your leg or thigh to keep it from moving. Check the catheter tubing regularly to make sure there are no kinks or blockages. Take showers daily to keep the catheter clean. Do not take a bath. Do not pull on your catheter. Disconnect the tubing and drainage bag as little as possible. Empty the drainage bag every 2-4 hours, or more often if needed. Do not let the bag get completely full. Wash your hands with soap and water before and after touching the catheter, tubing, or drainage bag. Do not let the drainage bag or catheter tubing touch the floor. Drink enough fluids to keep your urine pale yellow, or as told by your health care provider. How to remove the catheter Washing hands with soap and water.  Remove the catheter only if told by your health care provider. Follow instructions from your health care provider about when and how to remove the catheter. For most catheters, you  will need to take the following steps: Prepare your supplies. You will need a: Syringe. This would be given to you by your health care provider. Towel. Wastebasket. Empty the drainage bag if needed. Wash your hands with soap and warm water. Remove the tape that secures the catheter to your leg or thigh. Get into a comfortable position, such as: Lying down with your head raised on pillows and your knees pointing to the ceiling. Sitting on a chair or the edge of a bed. Place the towel under you to catch any spilled urine. Put the syringe into the balloon port of the catheter. Use a firm push and twist motion to fit the syringe into the balloon port. The water from the balloon will empty into the syringe. Gently pull out the catheter once the balloon is empty. If the catheter doesn't slide easily, do not use force. Let your health care provider know that you are not able to remove the catheter. Throw the used catheter and the syringe in the wastebasket. Wipe any spilled urine or water with the towel. Wash your hands with  soap and warm water.   Safety Let your health care provider know if: Your bladder is full, but you are not able to urinate. You have removed the catheter, but you are not able to urinate after 8 hours. Contact a health care provider if: Your urine: Looks cloudy. Has a bad smell. Stops flowing into the drainage bag. Your catheter: Gets clogged. Starts to leak. You feel pain or pressure in the bladder area. You have back pain. Your drainage bag or tubing looks dirty. Get help right away if: You have a fever or chills. You have severe pain in your back or your lower abdomen. You have warmth, redness, swelling, or pain in the urethra area. You notice blood in your urine. Your catheter gets pulled out. Summary Wash your hands with soap and water before and after touching the catheter, tubing, or drainage bag. Do not pull on your catheter or try to remove it. Keep  the drainage bag at or below the level of your bladder, but do not let the drainage bag or catheter tubing touch the floor. Get help right away if you have a fever, chills, or any other signs of infection. This information is not intended to replace advice given to you by your health care provider. Make sure you discuss any questions you have with your health care provider. Document Revised: 11/11/2020 Document Reviewed: 08/07/2020 Elsevier Patient Education  2022 [place]   The drugs that you were given will stay in your system until tomorrow so for the next 24 hours you should not:  Drive an automobile Make any legal decisions Drink any alcoholic beverage   You may resume regular meals tomorrow.  Today it is better to start with liquids and gradually work up to solid foods.  You may eat anything you prefer, but it is better to start with liquids, then soup and crackers, and gradually work up to solid foods.   Please notify your doctor immediately if you have any unusual bleeding, trouble breathing, redness and pain at the surgery site, drainage, fever, or pain not relieved by medication.    Additional Instructions:    Please contact your physician with any problems or Same Day Surgery at [phone], Monday through Friday 6 am to 4 pm, or [place] at [facility] number at [phone].

## 2021-10-07 NOTE — Progress Notes (Signed)
Irrigated foley with 60 mls normal saline with clear pink colored urine. No clots noted. Irrigation instructions with demonstration discussed and shown to wife. She has experience in healthcare field and voices level of comfort performing this at home. Leg bag changed.

## 2021-10-07 NOTE — H&P (Signed)
Date of Initial H&P: 09/30/21  History reviewed, patient examined, no change in status, stable for surgery.

## 2021-10-07 NOTE — Anesthesia Postprocedure Evaluation (Signed)
Anesthesia Post Note  Patient: [place] Bell.  Procedure(s) Performed: GREEN LIGHT LASER TURP (TRANSURETHRAL RESECTION OF PROSTATE)  Patient location during evaluation: PACU Anesthesia Type: General Level of consciousness: awake and alert Pain management: pain level controlled Vital Signs Assessment: post-procedure vital signs reviewed and stable Respiratory status: spontaneous breathing, nonlabored ventilation and respiratory function stable Cardiovascular status: blood pressure returned to baseline and stable Postop Assessment: no apparent nausea or vomiting Anesthetic complications: no   No notable events documented.   Last Vitals:  Vitals:   10/07/21 1021 10/07/21 1100  BP: (!) 113/53 (!) 110/58  Pulse: 68 63  Resp: 15 16  Temp: (!) 36.1 C   SpO2: 98% 99%    Last Pain:  Vitals:   10/07/21 1100  TempSrc:   PainSc: 2                  [place] Ouch

## 2021-10-08 ENCOUNTER — Encounter: Payer: Self-pay | Admitting: Urology

## 2021-10-09 ENCOUNTER — Encounter: Payer: Self-pay | Admitting: Family Medicine

## 2021-10-09 DIAGNOSIS — N401 Enlarged prostate with lower urinary tract symptoms: Secondary | ICD-10-CM | POA: Insufficient documentation

## 2021-10-09 DIAGNOSIS — N138 Other obstructive and reflux uropathy: Secondary | ICD-10-CM | POA: Insufficient documentation

## 2021-10-11 ENCOUNTER — Ambulatory Visit: Payer: Medicare PPO | Admitting: Dermatology

## 2021-10-22 DIAGNOSIS — M1712 Unilateral primary osteoarthritis, left knee: Secondary | ICD-10-CM | POA: Diagnosis not present

## 2021-11-04 DIAGNOSIS — N401 Enlarged prostate with lower urinary tract symptoms: Secondary | ICD-10-CM | POA: Diagnosis not present

## 2021-11-10 DIAGNOSIS — N401 Enlarged prostate with lower urinary tract symptoms: Secondary | ICD-10-CM | POA: Diagnosis not present

## 2021-11-10 DIAGNOSIS — N5201 Erectile dysfunction due to arterial insufficiency: Secondary | ICD-10-CM | POA: Diagnosis not present

## 2021-11-25 ENCOUNTER — Ambulatory Visit (INDEPENDENT_AMBULATORY_CARE_PROVIDER_SITE_OTHER): Payer: Medicare PPO

## 2021-11-25 VITALS — Wt 146.0 lb

## 2021-11-25 DIAGNOSIS — Z0000 Encounter for general adult medical examination without abnormal findings: Secondary | ICD-10-CM | POA: Diagnosis not present

## 2021-11-25 NOTE — Progress Notes (Signed)
? ?Subjective:  ? [place] Bell. is a 76 y.o. male who presents for Medicare Annual/Subsequent preventive examination. ? ?Virtual Visit via Telephone Note ? ?I connected with  [place] Bell. on 11/25/21 at 12:00 PM EDT by telephone and verified that I am speaking with the correct person using two identifiers. ? ?Location: ?Patient: Home ?Provider: [facility] ?Persons participating in the virtual visit: patient/Nurse Health Advisor ?  ?I discussed the limitations, risks, security and privacy concerns of performing an evaluation and management service by telephone and the availability of in person appointments. The patient expressed understanding and agreed to proceed. ? ?Interactive audio and video telecommunications were attempted between this nurse and patient, however failed, due to patient having technical difficulties OR patient did not have access to video capability.  We continued and completed visit with audio only. ? ?Some vital signs may be absent or patient reported.  ? ?[place] Christiana Dionne Ano, LPN  ? ?Review of Systems    ? ?Cardiac Risk Factors include: advanced age (>59mn, >>97women);male gender;dyslipidemia;Other (see comment), Risk factor comments: hx A.Fib, hx OSA, cirrhosis, NASH ? ?   ?Objective:  ?  ?Today's Vitals  ? 11/25/21 1156  ?Weight: 146 lb (66.2 kg)  ? ?Body mass index is 24.3 kg/m?. ? ? ?  11/25/2021  ? 12:04 PM 10/07/2021  ?  6:28 AM 09/29/2021  ? 11:35 AM 11/24/2020  ?  8:14 AM 07/31/2019  ?  9:52 AM 07/16/2018  ?  9:47 AM 07/07/2017  ?  1:09 PM  ?Advanced Directives  ?Does Patient Have a Medical Advance Directive? No No No Yes Yes Yes Yes  ?Type of ATheatre stage managerof AArgyleLiving will HSanfordLiving will HArnotLiving will HTroyLiving will  ?Copy of HLaderain Chart?    No - copy requested No - copy requested No - copy requested No - copy requested  ?Would patient like information on  creating a medical advance directive? No - Patient declined No - Patient declined No - Patient declined      ? ? ?Current Medications (verified) ?Outpatient Encounter Medications as of 11/25/2021  ?Medication Sig  ? diltiazem (CARDIZEM) 30 MG tablet Take 30 mg by mouth 4 (four) times daily as needed (afib).  ? gabapentin (NEURONTIN) 300 MG capsule Take 300 mg by mouth 3 (three) times daily.  ? Polyethyl Glycol-Propyl Glycol (SYSTANE OP) Place 1 drop into both eyes 3 (three) times daily as needed (dry eyes).  ? [DISCONTINUED] Baclofen 5 % CREA Apply 1 g topically 4 (four) times daily as needed. Diclofenac 3%, Gabapentin 10% cream (Patient not taking: Reported on 11/25/2021)  ? [DISCONTINUED] ciprofloxacin (CIPRO) 500 MG tablet Take 1 tablet (500 mg total) by mouth 2 (two) times daily.  ? [DISCONTINUED] docusate sodium (COLACE) 100 MG capsule Take 2 capsules (200 mg total) by mouth 2 (two) times daily. (Patient not taking: Reported on 11/25/2021)  ? [DISCONTINUED] lidocaine-prilocaine (EMLA) cream SMARTSIG:2-3 Gram(s) Topical 4 Times Daily (Patient not taking: Reported on 11/25/2021)  ? [DISCONTINUED] Meth-Hyo-M Bl-Na Phos-Ph Sal (URIBEL) 118 MG CAPS Take 1 capsule (118 mg total) by mouth every 6 (six) hours as needed.  ? [DISCONTINUED] tamsulosin (FLOMAX) 0.4 MG CAPS capsule Take 0.4 mg by mouth in the morning, at noon, and at bedtime. (Patient not taking: Reported on 11/25/2021)  ? ?No facility-administered encounter medications on file as of 11/25/2021.  ? ? ?Allergies (verified) ?Patient has  no active allergies.  ? ?History: ?Past Medical History:  ?Diagnosis Date  ? Actinic keratosis   ? Atrial fib/flutter, transient 04/28/2010  ? typical appearing atrial flutter dx 04/28/10 s/p CTI ablation 10/11  ? BPH (benign prostatic hyperplasia)   ? PSA stable, followed by Dr. Eliberto Bell  ? Cancer [facility])   ? Cataract   ? Removed  ? CHF (congestive heart failure) ([place])   ? Chronic kidney disease   ? Constrictive pericarditis 1969  ?  unknown cause s/p pericardectomy  ? Coronary artery disease   ? Depression   ? on sertraline  ? DJD (degenerative joint disease)   ? Gout   ? Liver cirrhosis secondary to NASH (nonalcoholic steatohepatitis) ([place]) 2001  ? ?by liver biopsy  ? Persistent atrial fibrillation ([place]) 09/15/2010  ? S/P PVI ([place]ry[place]) at [facility]  ? Syncope 1997  ? recurrent S/P tilt study by Dr. Caryl Bell (like due to dysautonomia)  ? ?Past Surgical History:  ?Procedure Laterality Date  ? ATRIAL FIBRILLATION ABLATION  09/15/2010  ? Cryo at [place], [name] ablation at [facility] 04/28/10  ? CHOLECYSTECTOMY  1995  ? COLONOSCOPY  02/2000  ? 2 hyperplastic polyps, IBS [place] Bell)  ? ESOPHAGOGASTRODUODENOSCOPY  02/2000  ? [place])  ? GREEN LIGHT LASER TURP (TRANSURETHRAL RESECTION OF PROSTATE N/A 10/07/2021  ? urinary retention due to enlarged prostate after nephrectomy [place], [place] Limes, MD)  ? HEMORROIDECTOMY  2007  ? KNEE SURGERY  2007  ? he had osteoarthritis with left knee surgery  ? NEPHRECTOMY RADICAL Right 07/2021  ? Renal Mass on Right Kidney, surgery at [facility]  ? PERICARDIECTOMY  1969  ? at [facility]  ? thumb surgery    ? for osteoarthritis  ? ?Family History  ?Problem Relation Age of Onset  ? Hypertension Mother   ? Alcohol abuse Father   ?     smoker  ? Heart disease Father   ?     MI (smoker)  ? ?Social History  ? ?Socioeconomic History  ? Marital status: Married  ?  Spouse name: Not on file  ? Number of children: Not on file  ? Years of education: Not on file  ? Highest education level: Not on file  ?Occupational History  ? Occupation: Disabled Education officer, museum  ?  Employer: UNEMPLOYED  ?  Comment: previously worked as a Garment/textile technologist minor  ?Tobacco Use  ? Smoking status: Never  ? Smokeless tobacco: Never  ?Vaping Use  ? Vaping Use: Never used  ?Substance and Sexual Activity  ? Alcohol use: No  ? Drug use: No  ? Sexual activity: Yes  ?  Birth control/protection: None  ?Other Topics Concern  ? Not on file  ?Social History Narrative  ? "Low"   ? Patient lives in [place] with spouse  ? 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 Activity: He enjoys walking on a regular basis and plays tennis twice a week.  ? Diet: good water, fruits/vegetables daily  ? ?Social Determinants of Health  ? ?Financial Resource Strain: Low Risk   ? Difficulty of Paying Living Expenses: Not hard at all  ?Food Insecurity: No Food Insecurity  ? Worried About Charity fundraiser in the Last Year: Never true  ? Ran Out of Food in the Last Year: Never true  ?Transportation Needs: No Transportation Needs  ? Lack of Transportation (Medical): No  ? Lack of Transportation (Non-Medical): No  ?Physical Activity: Sufficiently Active  ? Days of Exercise  per Week: 3 days  ? Minutes of Exercise per Session: 100 min  ?Stress: No Stress Concern Present  ? Feeling of Stress : Not at all  ?Social Connections: Socially Integrated  ? Frequency of Communication with Friends and Family: More than three times a week  ? Frequency of Social Gatherings with Friends and Family: More than three times a week  ? Attends Religious Services: More than 4 times per year  ? Active Member of Clubs or Organizations: Yes  ? Attends Archivist Meetings: More than 4 times per year  ? Marital Status: Married  ? ? ?Tobacco Counseling ?Counseling given: Not Answered ? ? ?Clinical Intake: ? ?Pre-visit preparation completed: Yes ? ?Pain : No/denies pain ? ?  ? ?BMI - recorded: 24.3 ?Nutritional Status: BMI of 19-24  Normal ?Nutritional Risks: None ?Diabetes: No ? ?How often do you need to have someone help you when you read instructions, pamphlets, or other written materials from your doctor or pharmacy?: 1 - Never ? ?Diabetic? no ? ?Interpreter Needed?: No ? ?Information entered by :: Lavontay Kirk, LPN ? ? ?Activities of Daily Living ? ?  11/25/2021  ? 12:04 PM 09/29/2021  ? 11:35 AM  ?In your present state of health, do you have any difficulty performing the following activities:  ?Hearing? 0    ?Vision? 0   ?Difficulty concentrating or making decisions? 0   ?Walking or climbing stairs? 0   ?Dressing or bathing? 0   ?Doing errands, shopping? 0 0  ?Preparing Food and eating ? N   ?Using the Toilet?

## 2021-11-25 NOTE — Patient Instructions (Signed)
Ernest Bell , ?Thank you for taking time to come for your Medicare Wellness Visit. I appreciate your ongoing commitment to your health goals. Please review the following plan we discussed and let me know if I can assist you in the future.  ? ?Screening recommendations/referrals: ?Colonoscopy: Cologuard done 09/10/2019 - repeat in 3 years (2024) ?Recommended yearly ophthalmology/optometry visit for glaucoma screening and checkup ?Recommended yearly dental visit for hygiene and checkup ? ?Vaccinations: ?Influenza vaccine: Done 07/20/2021 - Repeat annually ?Pneumococcal vaccine: one 07/26/2011 & 01/31/2014 ?Tdap vaccine: Done 03/25/2013 - Repeat in 10 years ?Shingles vaccine: Zostavax done 2016 - Shingrix is 2 doses 2-6 months apart and over 90% effective     ?Covid-19: Done x3 - we need dates please. ? ?Advanced directives: Advance directive discussed with you today. Even though you declined this today, please call our office should you change your mind, and we can give you the proper paperwork for you to fill out.  ? ?Conditions/risks identified: Keep up the great work!  ? ?Next appointment: Follow up in one year for your annual wellness visit.  ? ?Preventive Care 28 Years and Older, Male ? ?Preventive care refers to lifestyle choices and visits with your health care provider that can promote health and wellness. ?What does preventive care include? ?A yearly physical exam. This is also called an annual well check. ?Dental exams once or twice a year. ?Routine eye exams. Ask your health care provider how often you should have your eyes checked. ?Personal lifestyle choices, including: ?Daily care of your teeth and gums. ?Regular physical activity. ?Eating a healthy diet. ?Avoiding tobacco and drug use. ?Limiting alcohol use. ?Practicing safe sex. ?Taking low doses of aspirin every day. ?Taking vitamin and mineral supplements as recommended by your health care provider. ?What happens during an annual well check? ?The services  and screenings done by your health care provider during your annual well check will depend on your age, overall health, lifestyle risk factors, and family history of disease. ?Counseling  ?Your health care provider may ask you questions about your: ?Alcohol use. ?Tobacco use. ?Drug use. ?Emotional well-being. ?Home and relationship well-being. ?Sexual activity. ?Eating habits. ?History of falls. ?Memory and ability to understand (cognition). ?Work and work Statistician. ?Screening  ?You may have the following tests or measurements: ?Height, weight, and BMI. ?Blood pressure. ?Lipid and cholesterol levels. These may be checked every 5 years, or more frequently if you are over 49 years old. ?Skin check. ?Lung cancer screening. You may have this screening every year starting at age 32 if you have a 30-pack-year history of smoking and currently smoke or have quit within the past 15 years. ?Fecal occult blood test (FOBT) of the stool. You may have this test every year starting at age 12. ?Flexible sigmoidoscopy or colonoscopy. You may have a sigmoidoscopy every 5 years or a colonoscopy every 10 years starting at age 8. ?Prostate cancer screening. Recommendations will vary depending on your family history and other risks. ?Hepatitis C blood test. ?Hepatitis B blood test. ?Sexually transmitted disease (STD) testing. ?Diabetes screening. This is done by checking your blood sugar (glucose) after you have not eaten for a while (fasting). You may have this done every 1-3 years. ?Abdominal aortic aneurysm (AAA) screening. You may need this if you are a current or former smoker. ?Osteoporosis. You may be screened starting at age 59 if you are at high risk. ?Talk with your health care provider about your test results, treatment options, and if necessary, the need  for more tests. ?Vaccines  ?Your health care provider may recommend certain vaccines, such as: ?Influenza vaccine. This is recommended every year. ?Tetanus, diphtheria,  and acellular pertussis (Tdap, Td) vaccine. You may need a Td booster every 10 years. ?Zoster vaccine. You may need this after age 63. ?Pneumococcal 13-valent conjugate (PCV13) vaccine. One dose is recommended after age 67. ?Pneumococcal polysaccharide (PPSV23) vaccine. One dose is recommended after age 63. ?Talk to your health care provider about which screenings and vaccines you need and how often you need them. ?This information is not intended to replace advice given to you by your health care provider. Make sure you discuss any questions you have with your health care provider. ?Document Released: 09/18/2015 Document Revised: 05/11/2016 Document Reviewed: 06/23/2015 ?Elsevier Interactive Patient Education ? 2017 [place]. ? ?Fall Prevention in the Home ?Falls can cause injuries. They can happen to people of all ages. There are many things you can do to make your home safe and to help prevent falls. ?What can I do on the outside of my home? ?Regularly fix the edges of walkways and driveways and fix any cracks. ?Remove anything that might make you trip as you walk through a door, such as a raised step or threshold. ?Trim any bushes or trees on the path to your home. ?Use bright outdoor lighting. ?Clear any walking paths of anything that might make someone trip, such as rocks or tools. ?Regularly check to see if handrails are loose or broken. Make sure that both sides of any steps have handrails. ?Any raised decks and porches should have guardrails on the edges. ?Have any leaves, snow, or ice cleared regularly. ?Use sand or salt on walking paths during winter. ?Clean up any spills in your garage right away. This includes oil or grease spills. ?What can I do in the bathroom? ?Use night lights. ?Install grab bars by the toilet and in the tub and shower. Do not use towel bars as grab bars. ?Use non-skid mats or decals in the tub or shower. ?If you need to sit down in the shower, use a plastic, non-slip  stool. ?Keep the floor dry. Clean up any water that spills on the floor as soon as it happens. ?Remove soap buildup in the tub or shower regularly. ?Attach bath mats securely with double-sided non-slip rug tape. ?Do not have throw rugs and other things on the floor that can make you trip. ?What can I do in the bedroom? ?Use night lights. ?Make sure that you have a light by your bed that is easy to reach. ?Do not use any sheets or blankets that are too big for your bed. They should not hang down onto the floor. ?Have a firm chair that has side arms. You can use this for support while you get dressed. ?Do not have throw rugs and other things on the floor that can make you trip. ?What can I do in the kitchen? ?Clean up any spills right away. ?Avoid walking on wet floors. ?Keep items that you use a lot in easy-to-reach places. ?If you need to reach something above you, use a strong step stool that has a grab bar. ?Keep electrical cords out of the way. ?Do not use floor polish or wax that makes floors slippery. If you must use wax, use non-skid floor wax. ?Do not have throw rugs and other things on the floor that can make you trip. ?What can I do with my stairs? ?Do not leave any items on the stairs. ?  Make sure that there are handrails on both sides of the stairs and use them. Fix handrails that are broken or loose. Make sure that handrails are as long as the stairways. ?Check any carpeting to make sure that it is firmly attached to the stairs. Fix any carpet that is loose or worn. ?Avoid having throw rugs at the top or bottom of the stairs. If you do have throw rugs, attach them to the floor with carpet tape. ?Make sure that you have a light switch at the top of the stairs and the bottom of the stairs. If you do not have them, ask someone to add them for you. ?What else can I do to help prevent falls? ?Wear shoes that: ?Do not have high heels. ?Have rubber bottoms. ?Are comfortable and fit you well. ?Are closed at the  toe. Do not wear sandals. ?If you use a stepladder: ?Make sure that it is fully opened. Do not climb a closed stepladder. ?Make sure that both sides of the stepladder are locked into place. ?Ask someone to hold i

## 2021-12-30 DIAGNOSIS — M1711 Unilateral primary osteoarthritis, right knee: Secondary | ICD-10-CM | POA: Diagnosis not present

## 2022-01-19 DIAGNOSIS — R350 Frequency of micturition: Secondary | ICD-10-CM | POA: Diagnosis not present

## 2022-01-19 DIAGNOSIS — C641 Malignant neoplasm of right kidney, except renal pelvis: Secondary | ICD-10-CM | POA: Diagnosis not present

## 2022-01-19 DIAGNOSIS — I318 Other specified diseases of pericardium: Secondary | ICD-10-CM | POA: Diagnosis not present

## 2022-01-19 DIAGNOSIS — K7460 Unspecified cirrhosis of liver: Secondary | ICD-10-CM | POA: Diagnosis not present

## 2022-01-19 DIAGNOSIS — N401 Enlarged prostate with lower urinary tract symptoms: Secondary | ICD-10-CM | POA: Diagnosis not present

## 2022-01-24 DIAGNOSIS — N5201 Erectile dysfunction due to arterial insufficiency: Secondary | ICD-10-CM | POA: Diagnosis not present

## 2022-01-24 DIAGNOSIS — Z125 Encounter for screening for malignant neoplasm of prostate: Secondary | ICD-10-CM | POA: Diagnosis not present

## 2022-01-24 DIAGNOSIS — N401 Enlarged prostate with lower urinary tract symptoms: Secondary | ICD-10-CM | POA: Diagnosis not present

## 2022-01-24 DIAGNOSIS — N390 Urinary tract infection, site not specified: Secondary | ICD-10-CM | POA: Diagnosis not present

## 2022-01-24 DIAGNOSIS — R300 Dysuria: Secondary | ICD-10-CM | POA: Diagnosis not present

## 2022-01-24 DIAGNOSIS — Z85528 Personal history of other malignant neoplasm of kidney: Secondary | ICD-10-CM | POA: Diagnosis not present

## 2022-02-03 DIAGNOSIS — N390 Urinary tract infection, site not specified: Secondary | ICD-10-CM | POA: Diagnosis not present

## 2022-02-03 DIAGNOSIS — N401 Enlarged prostate with lower urinary tract symptoms: Secondary | ICD-10-CM | POA: Diagnosis not present

## 2022-02-03 DIAGNOSIS — R3914 Feeling of incomplete bladder emptying: Secondary | ICD-10-CM | POA: Diagnosis not present

## 2022-02-03 DIAGNOSIS — Z85528 Personal history of other malignant neoplasm of kidney: Secondary | ICD-10-CM | POA: Diagnosis not present

## 2022-02-08 ENCOUNTER — Ambulatory Visit: Payer: Medicare PPO | Admitting: Dermatology

## 2022-02-08 DIAGNOSIS — L570 Actinic keratosis: Secondary | ICD-10-CM

## 2022-02-08 DIAGNOSIS — Z1283 Encounter for screening for malignant neoplasm of skin: Secondary | ICD-10-CM | POA: Diagnosis not present

## 2022-02-08 DIAGNOSIS — L578 Other skin changes due to chronic exposure to nonionizing radiation: Secondary | ICD-10-CM

## 2022-02-08 DIAGNOSIS — D1800 Hemangioma unspecified site: Secondary | ICD-10-CM | POA: Diagnosis not present

## 2022-02-08 DIAGNOSIS — L820 Inflamed seborrheic keratosis: Secondary | ICD-10-CM

## 2022-02-08 DIAGNOSIS — D229 Melanocytic nevi, unspecified: Secondary | ICD-10-CM | POA: Diagnosis not present

## 2022-02-08 DIAGNOSIS — L814 Other melanin hyperpigmentation: Secondary | ICD-10-CM

## 2022-02-08 DIAGNOSIS — L821 Other seborrheic keratosis: Secondary | ICD-10-CM | POA: Diagnosis not present

## 2022-02-08 DIAGNOSIS — D692 Other nonthrombocytopenic purpura: Secondary | ICD-10-CM | POA: Diagnosis not present

## 2022-02-08 NOTE — Patient Instructions (Addendum)
Actinic keratoses are precancerous spots that appear secondary to cumulative UV radiation exposure/sun exposure over time. They are chronic with expected duration over 1 year. A portion of actinic keratoses will progress to squamous cell carcinoma of the skin. It is not possible to reliably predict which spots will progress to skin cancer and so treatment is recommended to prevent development of skin cancer.  Recommend daily broad spectrum sunscreen SPF 30+ to sun-exposed areas, reapply every 2 hours as needed.  Recommend staying in the shade or wearing long sleeves, sun glasses (UVA+UVB protection) and wide brim hats (4-inch brim around the entire circumference of the hat). Call for new or changing lesions.   Cryotherapy Aftercare  Wash gently with soap and water everyday.   Apply Vaseline and Band-Aid daily until healed.    Seborrheic Keratosis  What causes seborrheic keratoses? Seborrheic keratoses are harmless, common skin growths that first appear during adult life.  As time goes by, more growths appear.  Some people may develop a large number of them.  Seborrheic keratoses appear on both covered and uncovered body parts.  They are not caused by sunlight.  The tendency to develop seborrheic keratoses can be inherited.  They vary in color from skin-colored to gray, brown, or even black.  They can be either smooth or have a rough, warty surface.   Seborrheic keratoses are superficial and look as if they were stuck on the skin.  Under the microscope this type of keratosis looks like layers upon layers of skin.  That is why at times the top layer may seem to fall off, but the rest of the growth remains and re-grows.    Treatment Seborrheic keratoses do not need to be treated, but can easily be removed in the office.  Seborrheic keratoses often cause symptoms when they rub on clothing or jewelry.  Lesions can be in the way of shaving.  If they become inflamed, they can cause itching, soreness, or  burning.  Removal of a seborrheic keratosis can be accomplished by freezing, burning, or surgery. If any spot bleeds, scabs, or grows rapidly, please return to have it checked, as these can be an indication of a skin cancer.  Melanoma ABCDEs  Melanoma is the most dangerous type of skin cancer, and is the leading cause of death from skin disease.  You are more likely to develop melanoma if you: Have light-colored skin, light-colored eyes, or red or blond hair Spend a lot of time in the sun Tan regularly, either outdoors or in a tanning bed Have had blistering sunburns, especially during childhood Have a close family member who has had a melanoma Have atypical moles or large birthmarks  Early detection of melanoma is key since treatment is typically straightforward and cure rates are extremely high if we catch it early.   The first sign of melanoma is often a change in a mole or a new dark spot.  The ABCDE system is a way of remembering the signs of melanoma.  A for asymmetry:  The two halves do not match. B for border:  The edges of the growth are irregular. C for color:  A mixture of colors are present instead of an even brown color. D for diameter:  Melanomas are usually (but not always) greater than 64m - the size of a pencil eraser. E for evolution:  The spot keeps changing in size, shape, and color.  Please check your skin once per month between visits. You can use a small  mirror in front and a large mirror behind you to keep an eye on the back side or your body.   If you see any new or changing lesions before your next follow-up, please call to schedule a visit.  Please continue daily skin protection including broad spectrum sunscreen SPF 30+ to sun-exposed areas, reapplying every 2 hours as needed when you're outdoors.   Staying in the shade or wearing long sleeves, sun glasses (UVA+UVB protection) and wide brim hats (4-inch brim around the entire circumference of the hat) are also  recommended for sun protection.    Due to recent changes in healthcare laws, you may see results of your pathology and/or laboratory studies on MyChart before the doctors have had a chance to review them. We understand that in some cases there may be results that are confusing or concerning to you. Please understand that not all results are received at the same time and often the doctors may need to interpret multiple results in order to provide you with the best plan of care or course of treatment. Therefore, we ask that you please give [place] 2 business days to thoroughly review all your results before contacting the office for clarification. Should we see a critical lab result, you will be contacted sooner.   If You Need Anything After Your Visit  If you have any questions or concerns for your doctor, please call our main line at [phone] and press option 4 to reach your doctor's medical assistant. If no one answers, please leave a voicemail as directed and we will return your call as soon as possible. Messages left after 4 pm will be answered the following business day.   You may also send [place] a message via [place]. We typically respond to MyChart messages within 1-2 business days.  For prescription refills, please ask your pharmacy to contact our office. Our fax number is [phone].  If you have an urgent issue when the clinic is closed that cannot wait until the next business day, you can page your doctor at the number below.    Please note that while we do our best to be available for urgent issues outside of office hours, we are not available 24/7.   If you have an urgent issue and are unable to reach [place], you may choose to seek medical care at your doctor's office, retail clinic, urgent care center, or emergency room.  If you have a medical emergency, please immediately call 911 or go to the emergency department.  Pager Numbers  - Dr. Nehemiah Massed: [phone]  - Dr. Laurence Ferrari:  [phone]  - Dr. Nicole Kindred: [phone]  In the event of inclement weather, please call our main line at [phone] for an update on the status of any delays or closures.  Dermatology Medication Tips: Please keep the boxes that topical medications come in in order to help keep track of the instructions about where and how to use these. Pharmacies typically print the medication instructions only on the boxes and not directly on the medication tubes.   If your medication is too expensive, please contact our office at [phone] option 4 or send [place] a message through [place].   We are unable to tell what your co-pay for medications will be in advance as this is different depending on your insurance coverage. However, we may be able to find a substitute medication at lower cost or fill out paperwork to get insurance to cover a needed medication.   If a prior authorization  is required to get your medication covered by your insurance company, please allow [place] 1-2 business days to complete this process.  Drug prices often vary depending on where the prescription is filled and some pharmacies may offer cheaper prices.  The website www.goodrx.com contains coupons for medications through different pharmacies. The prices here do not account for what the cost may be with help from insurance (it may be cheaper with your insurance), but the website can give you the price if you did not use any insurance.  - You can print the associated coupon and take it with your prescription to the pharmacy.  - You may also stop by our office during regular business hours and pick up a GoodRx coupon card.  - If you need your prescription sent electronically to a different pharmacy, notify our office through [facility] or by phone at [phone] option 4.     Si Usted Necesita Algo Despus de Su Visita  Tambin puede enviarnos un mensaje a travs de Pharmacist, community. Por lo general respondemos a los mensajes de  MyChart en el transcurso de 1 a 2 das hbiles.  Para renovar recetas, por favor pida a su farmacia que se ponga en contacto con nuestra oficina. Harland Dingwall de fax es [place] [phone].  Si tiene un asunto urgente cuando la clnica est cerrada y que no puede esperar hasta el siguiente da hbil, puede llamar/localizar a su doctor(a) al nmero que aparece a continuacin.   Por favor, tenga en cuenta que aunque hacemos todo lo posible para estar disponibles para asuntos urgentes fuera del horario de [place], no estamos disponibles las 24 horas del da, los 7 das de la [place].   Si tiene un problema urgente y no puede comunicarse con nosotros, puede optar por buscar atencin mdica  en el consultorio de su doctor(a), en una clnica privada, en un centro de atencin urgente o en una sala de emergencias.  Si tiene Engineering geologist, por favor llame inmediatamente al 911 o vaya a la sala de emergencias.  Nmeros de bper  - Dr. Nehemiah Massed: [phone]  - Dra. Moye: [phone]  - Dra. Nicole Kindred: [phone]  En caso de inclemencias del [place], por favor llame a Johnsie Kindred principal al [phone] para una actualizacin sobre el [place] de cualquier retraso o cierre.  Consejos para la medicacin en dermatologa: Por favor, guarde las cajas en las que vienen los medicamentos de uso tpico para ayudarle a seguir las instrucciones sobre dnde y cmo usarlos. Las farmacias generalmente imprimen las instrucciones del medicamento slo en las cajas y no directamente en los tubos del [place].   Si su medicamento es muy caro, por favor, pngase en contacto con Zigmund Daniel llamando al [phone] y presione la opcin 4 o envenos un mensaje a travs de Pharmacist, community.   No podemos decirle cul ser su copago por los medicamentos por adelantado ya que esto es diferente dependiendo de la cobertura de su seguro. Sin embargo, es posible que podamos encontrar un medicamento sustituto a Contractor un formulario para que el seguro cubra el medicamento que se considera necesario.   Si se requiere una autorizacin previa para que su compaa de seguros [place] su medicamento, por favor permtanos de 1 a 2 das hbiles para completar este proceso.  Los precios de los medicamentos varan con frecuencia dependiendo del Environmental consultant de dnde se surte la receta y alguna farmacias pueden ofrecer precios ms baratos.  El sitio web www.goodrx.com tiene cupones para medicamentos  de [employer]. Los precios aqu no tienen en cuenta lo que podra costar con la ayuda del seguro (puede ser ms barato con su seguro), pero el sitio web puede darle el precio si no utiliz Research scientist (physical sciences).  - Puede imprimir el cupn correspondiente y llevarlo con su receta a la farmacia.  - Tambin puede pasar por nuestra oficina durante el horario de atencin regular y Charity fundraiser una tarjeta de cupones de GoodRx.  - Si necesita que su receta se enve electrnicamente a una farmacia diferente, informe a nuestra oficina a travs de MyChart de [place] o por telfono llamando al [phone] y presione la opcin 4.

## 2022-02-08 NOTE — Progress Notes (Signed)
Follow-Up Visit   Subjective  Ernest Bell. is a 76 y.o. male who presents for the following: Annual Exam (Tbse. Hx of aks. Patient reports a few places at back, face, and at right lower leg he would like checked. ).  The patient presents for Total-Body Skin Exam (TBSE) for skin cancer screening and mole check.  The patient has spots, moles and lesions to be evaluated, some may be new or changing and the patient has concerns that these could be cancer.   The following portions of the chart were reviewed this encounter and updated as appropriate:      Review of Systems: No other skin or systemic complaints except as noted in HPI or Assessment and Plan.   Objective  Well appearing patient in no apparent distress; mood and affect are within normal limits.  A full examination was performed including scalp, head, eyes, ears, nose, lips, neck, chest, axillae, abdomen, back, buttocks, bilateral upper extremities, bilateral lower extremities, hands, feet, fingers, toes, fingernails, and toenails. All findings within normal limits unless otherwise noted below.  right spinal mid back x 1 , right lateral lower leg x 1, right lower side burn x 3 (5) Erythematous stuck-on, waxy papule  right temple x 5, right side burn x 2, frontal scalp x 1 (8) Erythematous thin papules/macules with gritty scale.    Assessment & Plan  Inflamed seborrheic keratosis (5) right spinal mid back x 1 , right lateral lower leg x 1, right lower side burn x 3  Vs aks   Symptomatic, irritating, patient would like treated.    Destruction of lesion - right spinal mid back x 1 , right lateral lower leg x 1, right lower side burn x 3  Destruction method: cryotherapy   Informed consent: discussed and consent obtained   Lesion destroyed using liquid nitrogen: Yes   Region frozen until ice ball extended beyond lesion: Yes   Outcome: patient tolerated procedure well with no complications   Post-procedure  details: wound care instructions given   Additional details:  Prior to procedure, discussed risks of blister formation, small wound, skin dyspigmentation, or rare scar following cryotherapy. Recommend Vaseline ointment to treated areas while healing.   Actinic keratosis (8) right temple x 5, right side burn x 2, frontal scalp x 1  Actinic keratoses are precancerous spots that appear secondary to cumulative UV radiation exposure/sun exposure over time. They are chronic with expected duration over 1 year. A portion of actinic keratoses will progress to squamous cell carcinoma of the skin. It is not possible to reliably predict which spots will progress to skin cancer and so treatment is recommended to prevent development of skin cancer.  Recommend daily broad spectrum sunscreen SPF 30+ to sun-exposed areas, reapply every 2 hours as needed.  Recommend staying in the shade or wearing long sleeves, sun glasses (UVA+UVB protection) and wide brim hats (4-inch brim around the entire circumference of the hat). Call for new or changing lesions.  Destruction of lesion - right temple x 5, right side burn x 2, frontal scalp x 1  Destruction method: cryotherapy   Informed consent: discussed and consent obtained   Lesion destroyed using liquid nitrogen: Yes   Region frozen until ice ball extended beyond lesion: Yes   Outcome: patient tolerated procedure well with no complications   Post-procedure details: wound care instructions given   Additional details:  Prior to procedure, discussed risks of blister formation, small wound, skin dyspigmentation, or rare scar following  cryotherapy. Recommend Vaseline ointment to treated areas while healing.   Lentigines - Scattered tan macules - Due to sun exposure - Benign-appearing, observe - Recommend daily broad spectrum sunscreen SPF 30+ to sun-exposed areas, reapply every 2 hours as needed. - Call for any changes  Seborrheic Keratoses - Stuck-on, waxy,  tan-brown papules and/or plaques  - Benign-appearing - Discussed benign etiology and prognosis. - Observe - Call for any changes  Purpura - Chronic; persistent and recurrent.  Treatable, but not curable. - Violaceous macules and patches - Benign - Related to trauma, age, sun damage and/or use of blood thinners, chronic use of topical and/or oral steroids - Observe - Can use OTC arnica containing moisturizer such as Dermend Bruise Formula if desired - Call for worsening or other concerns  Melanocytic Nevi - Tan-brown and/or pink-flesh-colored symmetric macules and papules - Benign appearing on exam today - Observation - Call clinic for new or changing moles - Recommend daily use of broad spectrum spf 30+ sunscreen to sun-exposed areas.   Hemangiomas - Red papules - Discussed benign nature - Observe - Call for any changes  Actinic Damage - Chronic condition, secondary to cumulative UV/sun exposure - diffuse scaly erythematous macules with underlying dyspigmentation - Recommend daily broad spectrum sunscreen SPF 30+ to sun-exposed areas, reapply every 2 hours as needed.  - Staying in the shade or wearing long sleeves, sun glasses (UVA+UVB protection) and wide brim hats (4-inch brim around the entire circumference of the hat) are also recommended for sun protection.  - Call for new or changing lesions.  Skin cancer screening performed today. Return in about 1 year (around 02/09/2023) for TBSE, h/o AKs. I, Ruthell Rummage, CMA, am acting as scribe for Brendolyn Patty, MD.  Documentation: I have reviewed the above documentation for accuracy and completeness, and I agree with the above.  Brendolyn Patty MD

## 2022-02-14 DIAGNOSIS — N35011 Post-traumatic bulbous urethral stricture: Secondary | ICD-10-CM | POA: Diagnosis not present

## 2022-02-14 DIAGNOSIS — N401 Enlarged prostate with lower urinary tract symptoms: Secondary | ICD-10-CM | POA: Diagnosis not present

## 2022-02-14 DIAGNOSIS — R300 Dysuria: Secondary | ICD-10-CM | POA: Diagnosis not present

## 2022-02-14 DIAGNOSIS — N390 Urinary tract infection, site not specified: Secondary | ICD-10-CM | POA: Diagnosis not present

## 2022-02-22 DIAGNOSIS — N35011 Post-traumatic bulbous urethral stricture: Secondary | ICD-10-CM | POA: Diagnosis not present

## 2022-02-22 DIAGNOSIS — N5201 Erectile dysfunction due to arterial insufficiency: Secondary | ICD-10-CM | POA: Diagnosis not present

## 2022-02-22 DIAGNOSIS — N3020 Other chronic cystitis without hematuria: Secondary | ICD-10-CM | POA: Diagnosis not present

## 2022-03-10 ENCOUNTER — Encounter
Admission: RE | Admit: 2022-03-10 | Discharge: 2022-03-10 | Disposition: A | Discharge status: Home / Self Care | Payer: Medicare PPO | Source: Ambulatory Visit | Attending: Urology | Admitting: Urology

## 2022-03-10 ENCOUNTER — Other Ambulatory Visit: Payer: Self-pay

## 2022-03-10 HISTORY — DX: Unilateral primary osteoarthritis, right knee: M17.11

## 2022-03-10 HISTORY — DX: Chronic constrictive pericarditis: I31.1

## 2022-03-10 HISTORY — DX: Retention of urine, unspecified: R33.9

## 2022-03-10 HISTORY — DX: Malignant neoplasm of unspecified kidney, except renal pelvis: C64.9

## 2022-03-10 HISTORY — DX: Pneumonia, unspecified organism: J18.9

## 2022-03-10 HISTORY — DX: Acquired absence of kidney: Z90.5

## 2022-03-10 HISTORY — DX: Liver disease, unspecified: K76.9

## 2022-03-10 NOTE — Patient Instructions (Addendum)
Your procedure is scheduled on: Thursday, July 13 Report to the Registration Desk on the 1st floor of the [facility]. To find out your arrival time, please call [phone] between 1PM - 3PM on: Wednesday, July 12 If your arrival time is 6:00 am, do not arrive prior to that time as the [facility] entrance doors do not open until 6:00 am.  REMEMBER: Instructions that are not followed completely may result in serious medical risk, up to and including death; or upon the discretion of your surgeon and anesthesiologist your surgery may need to be rescheduled.  Do not eat food after midnight the night before surgery.  No gum chewing, lozengers or hard candies.  You may however, drink CLEAR liquids up to 2 hours before you are scheduled to arrive for your surgery. Do not drink anything within 2 hours of your scheduled arrival time.  Clear liquids include: - water  - apple juice without pulp - gatorade (not RED colors) - black coffee or tea (Do NOT add milk or creamers to the coffee or tea) Do NOT drink anything that is not on this list.  TAKE THESE MEDICATIONS THE MORNING OF SURGERY WITH A SIP OF WATER:  Gabapentin Diltiazem flomax   One week prior to surgery: Stop Anti-inflammatories (NSAIDS) such as Advil, Aleve, Ibuprofen, Motrin, Naproxen, Naprosyn and Aspirin based products such as Excedrin, Goodys Powder, BC Powder. Stop ANY OVER THE COUNTER supplements until after surgery. You may however, continue to take Tylenol if needed for pain up until the day of surgery.  No Alcohol for 24 hours before or after surgery.  No Smoking including e-cigarettes for 24 hours prior to surgery.  No chewable tobacco products for at least 6 hours prior to surgery.  No nicotine patches on the day of surgery.  Do not use any "recreational" drugs for at least a week prior to your surgery.  Please be advised that the combination of cocaine and anesthesia may have negative outcomes, up to and  including death. If you test positive for cocaine, your surgery will be cancelled.  On the morning of surgery brush your teeth with toothpaste and water, you may rinse your mouth with mouthwash if you wish. Do not swallow any toothpaste or mouthwash.  Do not wear jewelry, make-up, hairpins, clips or nail polish.  Do not wear lotions, powders, or perfumes.   Do not shave body from the neck down 48 hours prior to surgery just in case you cut yourself which could leave a site for infection.   Contact lenses, hearing aids and dentures may not be worn into surgery.  Do not bring valuables to the hospital. [facility] is not responsible for any missing/lost belongings or valuables.   Notify your doctor if there is any change in your medical condition (cold, fever, infection).  Wear comfortable clothing (specific to your surgery type) to the hospital.  After surgery, you can help prevent lung complications by doing breathing exercises.  Take deep breaths and cough every 1-2 hours. Your doctor may order a device called an Incentive Spirometer to help you take deep breaths. If you are being admitted to the hospital overnight, leave your suitcase in the car. After surgery it may be brought to your room.  If you are being discharged the day of surgery, you will not be allowed to drive home. You will need a responsible adult (18 years or older) to drive you home and stay with you that night.   If you  are taking public transportation, you will need to have a responsible adult (18 years or older) with you. Please confirm with your physician that it is acceptable to use public transportation.   Please call the [facility] at [phone] if you have any questions about these instructions.  Surgery Visitation Policy:  Patients undergoing a surgery or procedure may have two family members or support persons with them as long as the person is not COVID-19 positive or experiencing  its symptoms.

## 2022-03-11 NOTE — H&P (Unsigned)
NAMEJESSI, JESSOP MEDICAL RECORD NO: [id number] ACCOUNT NO: [id number] DATE OF BIRTH: 1945-10-17 FACILITY: [facility] LOCATION: [facility] PHYSICIAN: Otelia Limes. Yves Dill, MD  History and Physical   DATE OF ADMISSION: 03/17/2022  SAME DAY SURGERY:  07/13.  CHIEF COMPLAINT:  Difficulty voiding.  HISTORY OF PRESENT ILLNESS:  The patient is a 76 year old white male who has had progressively weakening urinary stream in the last 2 weeks.  IPSS score was 15 with a quality of life score of 4.  He was also found to have a urinary tract infection  earlier in June.  He has a history of urinary retention that was treated with photovaporization of prostate with GreenLight laser in February of 2023.  He initially had excellent urinary stream, but the stream has weakened since that time. 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  Bladder was not examined because the scope would not pass beyond the stricture.  He comes in now for a Optilume internal urethrotomy.  PAST MEDICA HISTORY: ALLERGIES:  No drug allergies.  CURRENT MEDICATIONS:  Diltiazem, gabapentin, ibuprofen, tamsulosin, and tadalafil.  PAST SURGICAL HISTORY:  1.  Cryoablation for atrial fibrillation, 2011. 2.  Cholecystectomy 1996. 3.  Colonoscopy. 4.  Upper endoscopy. 5.  Hemorrhoidectomy. 6.  Left knee surgery. 7.  Pericardectomy 1969. 8.  Bilateral carpal tunnel repair 2002. 9.  TUMT of the prostate, 2017. 10.  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 11.  Coronary artery stent placement. 12. Radical nephrectomy 2022 for renal cell carcinoma.  PAST AND CURRENT MEDICAL CONDITIONS:  1.  Coronary artery disease. 2.  Atrial fibrillation. 3.  History of renal cell carcinoma. 4.  Congestive heart failure. 5.  Depression. 6.  Degenerative joint disease. 7.  History of liver cirrhosis. 8.  BPH. 9.  Erectile dysfunction.  REVIEW OF SYSTEMS:  The patient denies chest pain,  shortness of breath, diabetes or hypertension.  FAMILY HISTORY:  Father died of heart disease at age 8.  There is no family history of urologic disease or urological malignancy.  SOCIAL HISTORY:  The patient denied tobacco or alcohol use.  PHYSICAL EXAMINATION:   VITAL SIGNS:  Height was 5 feet 4 inches, weight 158 pounds, BMI 27. GENERAL:  Well-nourished white male in no acute distress. HEENT:  Sclerae were clear.  Pupils were equally round, reactive to light and accommodation.  Extraocular movements were intact. NECK:  No palpable cervical adenopathy.  No audible carotid bruits. LUNGS:  Clear to auscultation. CARDIOVASCULAR:  Regular rhythm and rate. ABDOMEN:  Soft, nontender abdomen.  No CVA tenderness. GENITOURINARY:  Circumcised.  Testes smooth, nontender, approximately 18 mL in size each. RECTAL:  Greater than 50 gram smooth, nontender prostate. NEUROMUSCULAR:  Alert and oriented x3.  IMPRESSION:  Bulbar stricture with difficulty voiding.  PLAN:  Optilume internal urethrotomy.   PUS D: 03/10/2022 4:06:28 pm T: 03/10/2022 4:27:00 pm  JOB: [id number]/ [id number]

## 2022-03-14 DIAGNOSIS — K7460 Unspecified cirrhosis of liver: Secondary | ICD-10-CM | POA: Diagnosis not present

## 2022-03-14 DIAGNOSIS — I4891 Unspecified atrial fibrillation: Secondary | ICD-10-CM | POA: Diagnosis not present

## 2022-03-14 DIAGNOSIS — K7400 Hepatic fibrosis, unspecified: Secondary | ICD-10-CM | POA: Diagnosis not present

## 2022-03-14 DIAGNOSIS — K7689 Other specified diseases of liver: Secondary | ICD-10-CM | POA: Diagnosis not present

## 2022-03-14 DIAGNOSIS — K766 Portal hypertension: Secondary | ICD-10-CM | POA: Diagnosis not present

## 2022-03-14 DIAGNOSIS — I10 Essential (primary) hypertension: Secondary | ICD-10-CM | POA: Diagnosis not present

## 2022-03-14 DIAGNOSIS — N400 Enlarged prostate without lower urinary tract symptoms: Secondary | ICD-10-CM | POA: Diagnosis not present

## 2022-03-14 DIAGNOSIS — I2510 Atherosclerotic heart disease of native coronary artery without angina pectoris: Secondary | ICD-10-CM | POA: Diagnosis not present

## 2022-03-14 DIAGNOSIS — I4892 Unspecified atrial flutter: Secondary | ICD-10-CM | POA: Diagnosis not present

## 2022-03-14 DIAGNOSIS — K769 Liver disease, unspecified: Secondary | ICD-10-CM | POA: Diagnosis not present

## 2022-03-17 ENCOUNTER — Ambulatory Visit: Payer: Medicare PPO | Admitting: Urgent Care

## 2022-03-17 ENCOUNTER — Ambulatory Visit
Admission: RE | Admit: 2022-03-17 | Discharge: 2022-03-17 | Disposition: A | Discharge status: Home / Self Care | Payer: Medicare PPO | Source: Ambulatory Visit | Attending: Urology | Admitting: Urology

## 2022-03-17 ENCOUNTER — Encounter
Admission: RE | Disposition: A | Discharge status: Home / Self Care | Payer: Self-pay | Source: Ambulatory Visit | Attending: Urology

## 2022-03-17 ENCOUNTER — Ambulatory Visit: Payer: Medicare PPO

## 2022-03-17 ENCOUNTER — Other Ambulatory Visit: Payer: Self-pay

## 2022-03-17 ENCOUNTER — Encounter: Payer: Self-pay | Admitting: Urology

## 2022-03-17 DIAGNOSIS — I509 Heart failure, unspecified: Secondary | ICD-10-CM | POA: Insufficient documentation

## 2022-03-17 DIAGNOSIS — Z01810 Encounter for preprocedural cardiovascular examination: Secondary | ICD-10-CM | POA: Diagnosis not present

## 2022-03-17 DIAGNOSIS — I4819 Other persistent atrial fibrillation: Secondary | ICD-10-CM | POA: Insufficient documentation

## 2022-03-17 DIAGNOSIS — Z9889 Other specified postprocedural states: Secondary | ICD-10-CM | POA: Diagnosis not present

## 2022-03-17 DIAGNOSIS — N35812 Other urethral bulbous stricture, male: Secondary | ICD-10-CM | POA: Diagnosis not present

## 2022-03-17 DIAGNOSIS — Z905 Acquired absence of kidney: Secondary | ICD-10-CM | POA: Insufficient documentation

## 2022-03-17 DIAGNOSIS — N35919 Unspecified urethral stricture, male, unspecified site: Secondary | ICD-10-CM | POA: Diagnosis not present

## 2022-03-17 DIAGNOSIS — N320 Bladder-neck obstruction: Secondary | ICD-10-CM | POA: Insufficient documentation

## 2022-03-17 DIAGNOSIS — I2510 Atherosclerotic heart disease of native coronary artery without angina pectoris: Secondary | ICD-10-CM | POA: Diagnosis not present

## 2022-03-17 DIAGNOSIS — F32A Depression, unspecified: Secondary | ICD-10-CM | POA: Insufficient documentation

## 2022-03-17 DIAGNOSIS — N35912 Unspecified bulbous urethral stricture, male: Secondary | ICD-10-CM | POA: Diagnosis not present

## 2022-03-17 DIAGNOSIS — N400 Enlarged prostate without lower urinary tract symptoms: Secondary | ICD-10-CM | POA: Diagnosis not present

## 2022-03-17 HISTORY — PX: CYSTOSCOPY WITH DIRECT VISION INTERNAL URETHROTOMY: SHX6637

## 2022-03-17 SURGERY — CYSTOSCOPY, WITH DIRECT VISION INTERNAL URETHROTOMY
Anesthesia: General | Site: Urethra

## 2022-03-17 MED ORDER — PHENYLEPHRINE 80 MCG/ML (10ML) SYRINGE FOR IV PUSH (FOR BLOOD PRESSURE SUPPORT)
PREFILLED_SYRINGE | INTRAVENOUS | Status: AC
  Filled 2022-03-17: qty 10

## 2022-03-17 MED ORDER — CIPROFLOXACIN HCL 500 MG PO TABS: 500.0000 mg | ORAL_TABLET | Freq: Two times a day (BID) | ORAL | 0 refills | Status: DC

## 2022-03-17 MED ORDER — PROPOFOL 10 MG/ML IV BOLUS
INTRAVENOUS | Status: AC
  Filled 2022-03-17: qty 20

## 2022-03-17 MED ORDER — CHLORHEXIDINE GLUCONATE 0.12 % MT SOLN
OROMUCOSAL | Status: AC
  Administered 2022-03-17: 15 mL via OROMUCOSAL
  Filled 2022-03-17: qty 15

## 2022-03-17 MED ORDER — ESMOLOL HCL 100 MG/10ML IV SOLN
INTRAVENOUS | Status: AC
  Filled 2022-03-17: qty 10

## 2022-03-17 MED ORDER — LACTATED RINGERS IV SOLN: INTRAVENOUS | Status: DC

## 2022-03-17 MED ORDER — DEXAMETHASONE SODIUM PHOSPHATE 10 MG/ML IJ SOLN
INTRAMUSCULAR | Status: DC | PRN
  Administered 2022-03-17: 8 mg via INTRAVENOUS

## 2022-03-17 MED ORDER — CEFAZOLIN SODIUM-DEXTROSE 1-4 GM/50ML-% IV SOLN
1.0000 g | Freq: Once | INTRAVENOUS | Status: AC
  Administered 2022-03-17: 1 g via INTRAVENOUS

## 2022-03-17 MED ORDER — PROPOFOL 10 MG/ML IV BOLUS
INTRAVENOUS | Status: DC | PRN
  Administered 2022-03-17: 100 mg via INTRAVENOUS

## 2022-03-17 MED ORDER — FAMOTIDINE 20 MG PO TABS
ORAL_TABLET | ORAL | Status: AC
  Administered 2022-03-17: 20 mg via ORAL
  Filled 2022-03-17: qty 1

## 2022-03-17 MED ORDER — WATER FOR IRRIGATION, STERILE IR SOLN
Status: DC | PRN
  Administered 2022-03-17: 1000 mL

## 2022-03-17 MED ORDER — ONDANSETRON HCL 4 MG/2ML IJ SOLN: 4.0000 mg | Freq: Once | INTRAMUSCULAR | Status: DC | PRN

## 2022-03-17 MED ORDER — EPHEDRINE SULFATE (PRESSORS) 50 MG/ML IJ SOLN
INTRAMUSCULAR | Status: DC | PRN
  Administered 2022-03-17 (×2): 5 mg via INTRAVENOUS
  Administered 2022-03-17: 10 mg via INTRAVENOUS

## 2022-03-17 MED ORDER — ACETAMINOPHEN 10 MG/ML IV SOLN
INTRAVENOUS | Status: DC | PRN
  Administered 2022-03-17: 1000 mg via INTRAVENOUS

## 2022-03-17 MED ORDER — HYOSCYAMINE SULFATE SL 0.125 MG SL SUBL: 0.1250 mg | SUBLINGUAL_TABLET | SUBLINGUAL | 3 refills | Status: DC | PRN

## 2022-03-17 MED ORDER — EPHEDRINE 5 MG/ML INJ
INTRAVENOUS | Status: AC
  Filled 2022-03-17: qty 5

## 2022-03-17 MED ORDER — DEXAMETHASONE SODIUM PHOSPHATE 10 MG/ML IJ SOLN
INTRAMUSCULAR | Status: AC
  Filled 2022-03-17: qty 1

## 2022-03-17 MED ORDER — FENTANYL CITRATE (PF) 100 MCG/2ML IJ SOLN
INTRAMUSCULAR | Status: DC | PRN
  Administered 2022-03-17 (×2): 25 ug via INTRAVENOUS

## 2022-03-17 MED ORDER — FAMOTIDINE 20 MG PO TABS: 20.0000 mg | ORAL_TABLET | Freq: Once | ORAL | Status: AC

## 2022-03-17 MED ORDER — ESMOLOL HCL 100 MG/10ML IV SOLN
INTRAVENOUS | Status: DC | PRN
  Administered 2022-03-17: 20 mg via INTRAVENOUS

## 2022-03-17 MED ORDER — LIDOCAINE HCL (PF) 2 % IJ SOLN
INTRAMUSCULAR | Status: AC
  Filled 2022-03-17: qty 5

## 2022-03-17 MED ORDER — ONDANSETRON HCL 4 MG/2ML IJ SOLN
INTRAMUSCULAR | Status: DC | PRN
  Administered 2022-03-17: 4 mg via INTRAVENOUS

## 2022-03-17 MED ORDER — LIDOCAINE HCL (CARDIAC) PF 100 MG/5ML IV SOSY
PREFILLED_SYRINGE | INTRAVENOUS | Status: DC | PRN
  Administered 2022-03-17: 80 mg via INTRAVENOUS

## 2022-03-17 MED ORDER — ACETAMINOPHEN 10 MG/ML IV SOLN
INTRAVENOUS | Status: AC
  Filled 2022-03-17: qty 100

## 2022-03-17 MED ORDER — CHLORHEXIDINE GLUCONATE 0.12 % MT SOLN: 15.0000 mL | Freq: Once | OROMUCOSAL | Status: AC

## 2022-03-17 MED ORDER — LIDOCAINE HCL URETHRAL/MUCOSAL 2 % EX GEL
CUTANEOUS | Status: DC | PRN
  Administered 2022-03-17: 1 via TOPICAL

## 2022-03-17 MED ORDER — ONDANSETRON HCL 4 MG/2ML IJ SOLN
INTRAMUSCULAR | Status: AC
  Filled 2022-03-17: qty 2

## 2022-03-17 MED ORDER — FENTANYL CITRATE (PF) 100 MCG/2ML IJ SOLN: 25.0000 ug | INTRAMUSCULAR | Status: DC | PRN

## 2022-03-17 MED ORDER — HYDRALAZINE HCL 20 MG/ML IJ SOLN: INTRAMUSCULAR | Status: DC | PRN

## 2022-03-17 MED ORDER — PHENYLEPHRINE HCL (PRESSORS) 10 MG/ML IV SOLN
INTRAVENOUS | Status: DC | PRN
  Administered 2022-03-17 (×2): 160 ug via INTRAVENOUS
  Administered 2022-03-17: 80 ug via INTRAVENOUS
  Administered 2022-03-17: 160 ug via INTRAVENOUS
  Administered 2022-03-17: 80 ug via INTRAVENOUS
  Administered 2022-03-17 (×3): 160 ug via INTRAVENOUS
  Administered 2022-03-17: 80 ug via INTRAVENOUS

## 2022-03-17 MED ORDER — ORAL CARE MOUTH RINSE: 15.0000 mL | Freq: Once | OROMUCOSAL | Status: AC

## 2022-03-17 MED ORDER — URIBEL 118 MG PO CAPS: 1.0000 | ORAL_CAPSULE | Freq: Four times a day (QID) | ORAL | 3 refills | Status: DC | PRN

## 2022-03-17 MED ORDER — FENTANYL CITRATE (PF) 100 MCG/2ML IJ SOLN
INTRAMUSCULAR | Status: AC
  Filled 2022-03-17: qty 2

## 2022-03-17 MED ORDER — LIDOCAINE HCL URETHRAL/MUCOSAL 2 % EX GEL
CUTANEOUS | Status: AC
  Filled 2022-03-17: qty 10

## 2022-03-17 MED ORDER — IOHEXOL 180 MG/ML  SOLN
INTRAMUSCULAR | Status: DC | PRN
  Administered 2022-03-17 (×2): 10 mL

## 2022-03-17 MED ORDER — CEFAZOLIN SODIUM-DEXTROSE 1-4 GM/50ML-% IV SOLN
INTRAVENOUS | Status: AC
  Filled 2022-03-17: qty 50

## 2022-03-17 MED ORDER — SODIUM CHLORIDE 0.9 % IR SOLN
Status: DC | PRN
  Administered 2022-03-17 (×2): 3000 mL via INTRAVESICAL

## 2022-03-17 SURGICAL SUPPLY — 25 items
BAG DRAIN CYSTO-URO LG1000N (MISCELLANEOUS) ×2 IMPLANT
BAG DRN RND TRDRP ANRFLXCHMBR (UROLOGICAL SUPPLIES) ×1
BAG URINE DRAIN 2000ML AR STRL (UROLOGICAL SUPPLIES) ×2 IMPLANT
BALLN OPTILUME DCB 30X3X75 (BALLOONS) ×2
BALLOON OPTILUME DCB 30X3X75 (BALLOONS) IMPLANT
CATH FOLEY 2WAY  5CC 20FR SIL (CATHETERS) ×2
CATH FOLEY 2WAY 5CC 20FR SIL (CATHETERS) ×1 IMPLANT
DEVICE INFLATION ATRION QL4015 (MISCELLANEOUS) ×1 IMPLANT
GLIDEWIRE STIFF .35X180X3 HYDR (WIRE) IMPLANT
GLOVE BIO SURGEON STRL SZ7 (GLOVE) ×4 IMPLANT
GLOVE BIO SURGEON STRL SZ7.5 (GLOVE) ×2 IMPLANT
GLOVE BIOGEL PI IND STRL 7.0 (GLOVE) ×1 IMPLANT
GLOVE BIOGEL PI INDICATOR 7.0 (GLOVE) ×1
GOWN STRL REUS W/ TWL LRG LVL3 (GOWN DISPOSABLE) ×1 IMPLANT
GOWN STRL REUS W/ TWL XL LVL3 (GOWN DISPOSABLE) ×1 IMPLANT
GOWN STRL REUS W/TWL LRG LVL3 (GOWN DISPOSABLE) ×2
GOWN STRL REUS W/TWL XL LVL3 (GOWN DISPOSABLE) ×2
GUIDEWIRE ANG ZIPWIRE 035X150 (WIRE) ×1 IMPLANT
GUIDEWIRE STR DUAL SENSOR (WIRE) ×1 IMPLANT
KIT TURNOVER CYSTO (KITS) ×2 IMPLANT
PACK CYSTO AR (MISCELLANEOUS) ×2 IMPLANT
SET CYSTO W/LG BORE CLAMP LF (SET/KITS/TRAYS/PACK) ×2 IMPLANT
WATER STERILE IRR 1000ML POUR (IV SOLUTION) ×1 IMPLANT
WATER STERILE IRR 3000ML UROMA (IV SOLUTION) ×2 IMPLANT
WIRE G XSTIFF 025X145 (WIRE) ×1 IMPLANT

## 2022-03-17 NOTE — Anesthesia Procedure Notes (Signed)
Procedure Name: LMA Insertion Date/Time: 03/17/2022 8:23 AM  Performed by: Johnna Acosta, CRNAPre-anesthesia Checklist: Patient identified, Emergency Drugs available, Suction available, Patient being monitored and Timeout performed Patient Re-evaluated:Patient Re-evaluated prior to induction Oxygen Delivery Method: Circle system utilized Preoxygenation: Pre-oxygenation with 100% oxygen Induction Type: IV induction LMA: LMA inserted LMA Size: 4.0 Tube type: Oral Number of attempts: 1 Placement Confirmation: positive ETCO2 and breath sounds checked- equal and bilateral Tube secured with: Tape Dental Injury: Teeth and Oropharynx as per pre-operative assessment

## 2022-03-17 NOTE — Transfer of Care (Signed)
Immediate Anesthesia Transfer of Care Note  Patient: Ernest Bell.  Procedure(s) Performed: CYSTOSCOPY WITH DIRECT VISION INTERNAL URETHROTOMY  OPTULUME (Urethra)  Patient Location: PACU  Anesthesia Type:General  Level of Consciousness: awake  Airway & Oxygen Therapy: Patient Spontanous Breathing and Patient connected to face mask oxygen  Post-op Assessment: Report given to RN and Post -op Vital signs reviewed and stable  Post vital signs: Reviewed and stable  Last Vitals:  Vitals Value Taken Time  BP 121/86 03/17/22 0949  Temp    Pulse 67 03/17/22 0951  Resp 10 03/17/22 0951  SpO2 100 % 03/17/22 0951  Vitals shown include unvalidated device data.  Last Pain:  Vitals:   03/17/22 0723  TempSrc: Temporal  PainSc: 0-No pain         Complications: No notable events documented.

## 2022-03-17 NOTE — Op Note (Signed)
Preoperative diagnosis: 1.  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  Optilume internal urethrotomy (CPT [id number])                     2.  Transurethral incision of bladder neck contracture with holmium laser (CPT [phone])  Surgeon: Otelia Limes. Yves Dill MD  Anesthesia: General  Indications:See the history and physical also. 76 year old ([place]: 02/18/1946) white male who has had progressively weakening urinary stream in the last 2 weeks.  IPSS score was 15 with a quality of life score of 4.  He was also found to have a urinary tract infection  earlier in June.  He has a history of urinary retention that was treated with photovaporization of prostate with GreenLight laser in February of 2023.  He initially had excellent urinary stream, but the stream has weakened since that time. 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  Bladder was not examined because the scope would not pass beyond the stricture.  He comes in now for a Optilume internal urethrotomy.After informed consent the above procedure(s) were requested     Technique and findings: After adequate general anesthesia obtained patient was placed into dorsal lithotomy position and the perineum was prepped and draped in usual fashion.  The 89 French cystoscope was coupled the camera and visually advanced up to the stricture which was located in the bulbar urethra.  Opening through the stricture of less than 1 mm was identified and a 0.035 sensor wire passed through the stricture and curled into the bladder with fluoroscopic guidance.  The 10 mm Optilume balloon catheter was then advanced over the guidewire and positioned at the stricture.  The stricture was then dilated by inflating the balloon to 10 atm of pressure for 5 minutes.  The balloon was deflated and then removed.   Inspection through the stricture indicated that there was also a tight bladder neck contracture present.  The stricture appeared to be well dilated.  At this point the 550 m holmium laser fiber was introduced through the scope and the bladder neck contracture incised at the 3 and 9:00 positions with resultant widely patent bladder neck.  The cystoscope was then removed and 10 cc of viscous Xylocaine instilled within the urethra and the bladder.  A 20 French silicone catheter was placed and irrigated until clear.  The procedure was then terminated and patient transferred to the recovery room in stable condition.  Blood loss was minimal.

## 2022-03-17 NOTE — H&P (Signed)
Date of Initial H&P: 03/10/22  History reviewed, patient examined, no change in status, stable for surgery.

## 2022-03-17 NOTE — Discharge Instructions (Addendum)
Urethrotomy, Care After This sheet gives you information about how to care for yourself after your procedure. Your health care provider may also give you more specific instructions. If you have problems or questions, contact your health care provider. What can I expect after the procedure? After the procedure, it is common to have: Burning pain when urinating. Pain or discomfort in your genital area. A small amount of blood in your urine. Your health care provider will tell you how long you can expect to have blood in your urine. Bloody urine leaking from around your catheter. Follow these instructions at home: Catheter and drainage bag  Follow instructions from your health care provider about how to care for your catheter and your drainage bag. Do not take baths, swim, or use a hot tub until your catheter has been removed. You may take showers while your catheter is in place. If you have to insert a catheter on your own (self-catheterization) after your catheter is removed, make sure you understand the procedure completely. Carefully follow instructions from your health care provider. Medicines Take over-the-counter and prescription medicines only as told by your health care provider. If you were prescribed an antibiotic medicine, take it as told by your health care provider. Do not stop taking the antibiotic even if you start to feel better. Ask your health care provider if the medicine prescribed to you: Requires you to avoid driving or using heavy machinery. Can cause constipation. You may need to take these actions to prevent or treat constipation: Take over-the-counter or prescription medicines. Eat foods that are high in fiber, such as beans, whole grains, and fresh fruits and vegetables. Limit foods that are high in fat and processed sugars, such as fried or sweet foods. Activity  Do not drive for 24 hours if you were given a sedative during your procedure. Take short walks several  times a day during your recovery. Do not lift anything that is heavier than 10 lb (4.5 kg), or the limit that you are told, until your health care provider says that it is safe. Return to your normal activities as told by your health care provider. Ask your health care provider what activities are safe for you. Do not have sex until your health care provider says it is okay. General instructions Drink enough fluid to keep your urine pale yellow. If a bandage (dressing) was applied over the opening of your urethra, change the dressing as told by your health care provider. Make sure you: Wash your hands with soap and water before and after you change your dressing. If soap and water are not available, use hand sanitizer. Keep your dressing clean and dry. Do not use any products that contain nicotine or tobacco, such as cigarettes, e-cigarettes, and chewing tobacco. These can delay healing after surgery. If you need help quitting, ask your health care provider. Keep all follow-up visits as told by your health care provider. This is important. Contact a health care provider if you: Have a fever or chills. Have pain that gets worse or does not get better with medicine. Have blood in your urine for longer than your health care provider told you to expect. Are a male and have any of these problems: Trouble getting an erection. Pain when you have an erection. Blood in your semen. Have any of these problems after your catheter is removed: Trouble urinating. A slow urine stream. Urinating less than usual. Several streams or "spray" when you urinate. Have pain in your abdomen.  Have swelling in your genital area that does not go away. Get help right away if: You have severe pain. A lot of blood is leaking from around your catheter. You have blood clots in your urine. Your catheter stops draining urine. You cannot urinate after your catheter is removed. You have redness, warmth, or pain in your  leg. You have chest pain. You have trouble breathing. These symptoms may represent a serious problem that is an emergency. Do not wait to see if the symptoms will go away. Get medical help right away. Call your local emergency services (911 in the [place] Do not drive yourself to the hospital. Summary After the procedure, it is common to have burning pain when urinating and a small amount of blood in your urine. Follow instructions from your health care provider about how to care for your catheter and your drainage bag. Take short walks several times a day during your recovery. If a bandage (dressing) was applied over the opening of your urethra, change the dressing as told by your health care provider. Contact your health care provider if you have trouble urinating after your catheter is removed. This information is not intended to replace advice given to you by your health care provider. Make sure you discuss any questions you have with your health care provider. Document Revised: 02/25/2019 Document Reviewed: 02/25/2019 Elsevier Patient Education  [name].   Urethral Stricture The urinary anatomy, highlighting the bladder and urethra.   Urethral stricture is narrowing of the tube (urethra) that carries urine from the bladder out of the body. The urethra can become narrow due to scar tissue from an injury or infection. This can make it difficult to pass urine. In women, the urethra opens above the vaginal opening. In men, the urethra opens at the tip of the penis, and the urethra is much longer than it is in women. Because of the length of the male urethra, urethral stricture is much more common in men. This condition is treated with surgery. What are the causes? In both men and women, common causes of urethral stricture include: Urinary tract infection (UTI). Sexually transmitted infection (STI). Use of a tube placed into the urethra to drain urine from the bladder (urinary  catheter). Urinary tract surgery. In men, common causes of urethral stricture include: A severe injury to the pelvis. Prostate surgery. Injury to the penis. In many cases, the cause of urethral stricture is not known. What increases the risk? You are more likely to develop this condition if you: Are male. Men who have had prostate surgery are at risk of developing this condition. Use a urinary catheter. Have had urinary tract surgery. What are the signs or symptoms? The main symptom of this condition is difficulty passing urine. This may cause decreased urine flow, dribbling, or spraying of urine. Other symptom of this condition may include: Frequent UTIs. Blood in the urine. Pain when urinating. Swelling of the penis in men. Inability to pass urine (urinary obstruction). How is this diagnosed? This condition may be diagnosed based on: Your medical history and a physical exam. Urine tests to check for infection or bleeding. X-rays. Ultrasound. Retrograde urethrogram. This is a type of test in which dye is injected into the urethra and then an X-ray is taken. Urethroscopy. This is when a thin tube with a light and camera on the end (urethroscope) is used to look at the urethra. How is this treated? This condition is treated with surgery. The type of  surgery that you have depends on the severity of your condition. You may have: Urethral dilation. In this procedure, the narrow part of the urethra is stretched open (dilated) with dilating instruments or a small balloon. Urethrotomy. In this procedure, a urethroscope is placed into the urethra, and the narrow part of the urethra is cut open with a surgical blade inserted through the urethroscope. Open surgery. In this procedure, an incision is made in the urethra, the narrow part is removed, and the urethra is reconstructed. Follow these instructions at home: Three cups showing dark yellow, yellow, and pale yellow urine.   Take  over-the-counter and prescription medicines only as told by your health care provider. If you were prescribed an antibiotic medicine, take it as told by your health care provider. Do not stop taking the antibiotic even if you start to feel better. Drink enough fluid to keep your urine pale yellow. Keep all follow-up visits as told by your health care provider. This is important. Contact a health care provider if: You have signs of a urinary tract infection, such as: Frequent urination or passing small amounts of urine frequently. Needing to urinate urgently. Pain or burning with urination. Urine that smells bad or unusual. Cloudy urine. Pain in the lower abdomen or back. Trouble urinating. Blood in the urine. Vomiting or being less hungry than normal. Diarrhea or abdominal pain. Vaginal discharge, if you are male. Your symptoms are getting worse instead of better. Get help right away if: You cannot pass urine. You have a fever. You have swelling, bruising, or discoloration of your genital area. This includes the penis, scrotum, and inner thighs for men, and the outer genital organs (vulva) and inner thighs for women. You develop swelling in your legs. You have difficulty breathing. Summary Urethral stricture is narrowing of the tube (urethra) that carries urine from the bladder out of the body. The urethra can become narrow due to scar tissue from an injury or infection. This condition can make it difficult to pass urine. This condition is treated with surgery. The type of surgery that you have depends on the severity of your condition. Contact a health care provider if your symptoms get worse or you have signs of a urinary tract infection. This information is not intended to replace advice given to you by your health care provider. Make sure you discuss any questions you have with your health care provider. Document Revised: 06/29/2021 Document Reviewed: 06/29/2021 Elsevier Patient  Education  [place]   The drugs that you were given will stay in your system until tomorrow so for the next 24 hours you should not:  Drive an automobile Make any legal decisions Drink any alcoholic beverage   You may resume regular meals tomorrow.  Today it is better to start with liquids and gradually work up to solid foods.  You may eat anything you prefer, but it is better to start with liquids, then soup and crackers, and gradually work up to solid foods.   Please notify your doctor immediately if you have any unusual bleeding, trouble breathing, redness and pain at the surgery site, drainage, fever, or pain not relieved by medication.     Your post-operative visit with Dr.  is: Date:                        Time:    Please call to schedule your post-operative visit.  Additional Instructions:

## 2022-03-17 NOTE — Progress Notes (Signed)
Notified Dr. Rosey Bath patients HR was 140 and that the patient states "this happens all the time when I come in the hospital".  He gave me an order for an EKG and said he would see him.

## 2022-03-17 NOTE — Anesthesia Preprocedure Evaluation (Signed)
Anesthesia Evaluation  Patient identified by MRN, date of birth, ID band Patient awake    Reviewed: Allergy & Precautions, NPO status , Patient's Chart, lab work & pertinent test results  Airway Mallampati: II  TM Distance: >3 FB Neck ROM: full    Dental  (+) Dental Advidsory Given, Chipped   Pulmonary neg pulmonary ROS,    Pulmonary exam normal        Cardiovascular Exercise Tolerance: Good (-) hypertension+ CAD  (-) Past MI and (-) Cardiac Stents + dysrhythmias (h/o Afib s/p PVI 2012) Atrial Fibrillation (-) Valvular Problems/Murmurs Rhythm:Irregular Rate:Tachycardia  Tachycardia this morning. Asymptomatic. EKG sinus tach. 500cc Fluid with no change. BP elevated.   Constrictive pericarditis s/p pericardectomy 1969  Myocardial Perfusion Scan 11/22: Marland Kitchen  The study is normal. Findings are consistent with no prior ischemia and no prior myocardial infarction. The study is low risk. .  No ST deviation was noted. .  LV perfusion is normal. There is no evidence of ischemia. There is no evidence of infarction. .  Left ventricular function is normal. Nuclear stress EF: 59 %. The left ventricular ejection fraction is normal (55-65%). End diastolic cavity size is normal. End systolic cavity size is normal. .  Prior study available for comparison from 11/15/2013. No changes compared to prior study.     Neuro/Psych neg Seizures PSYCHIATRIC DISORDERS Depression  Neuromuscular disease (Neuropathy)    GI/Hepatic negative GI ROS, (+) Cirrhosis  (cirrhosis 2/2 NASH )      ,   Endo/Other  negative endocrine ROS  Renal/GU Renal diseaseS/p nephrectomy 2022   ENLARGED PROSTATE with need to intermittent cath due to retention    Musculoskeletal  (+) Arthritis ,   Abdominal Normal abdominal exam  (+)   Peds  Hematology negative hematology ROS (+)   Anesthesia Other Findings Past Medical History: No date: Actinic keratosis 04/28/2010:  Atrial fib/flutter, transient     Comment:  typical appearing atrial flutter dx 04/28/10 s/p CTI               ablation 10/11 No date: BPH (benign prostatic hyperplasia)     Comment:  PSA stable, followed by Dr. Eliberto Ivory No date: Cancer ([place]) No date: CHF (congestive heart failure) ([place]) No date: Chronic kidney disease 1969: Constrictive pericarditis     Comment:  unknown cause s/p pericardectomy No date: Coronary artery disease No date: Depression     Comment:  on sertraline No date: DJD (degenerative joint disease) No date: Gout 2001: Liver cirrhosis secondary to NASH (nonalcoholic  steatohepatitis) ([place])     Comment:  by liver biopsy 09/15/2010: Persistent atrial fibrillation ([place])     Comment:  S/P PVI (Cryo) at [place]: Syncope     Comment:  recurrent S/P tilt study by Dr. [name] (like due to               dysautonomia)  Past Surgical History: 09/15/2010: afib abation     Comment:  Cryo at [place], [place] ablation at [facility] 04/28/10 1995: CHOLECYSTECTOMY 02/2000: COLONOSCOPY     Comment:  2 hyperplastic polyps, [name] 02/2000: ESOPHAGOGASTRODUODENOSCOPY     Comment:  [name] 2007: HEMORROIDECTOMY 2007: KNEE SURGERY     Comment:  he had osteoarthritis with left knee surgery 07/2021: nephrectomy; Right     Comment:  Renal Mass on Right Kidney, surgery at [place]: PERICARDIECTOMY     Comment:  at [facility] No date: right thumb surger     Comment:  for osteoarthritis     Reproductive/Obstetrics negative OB ROS                             Anesthesia Physical  Anesthesia Plan  ASA: 2  Anesthesia Plan: General   Post-op Pain Management:    Induction: Intravenous  PONV Risk Score and Plan: Ondansetron, Dexamethasone and Treatment may vary due to age or medical condition  Airway Management Planned: LMA and Oral ETT  Additional Equipment:   Intra-op Plan:   Post-operative Plan: Extubation in  OR  Informed Consent: I have reviewed the patients History and Physical, chart, labs and discussed the procedure including the risks, benefits and alternatives for the proposed anesthesia with the patient or authorized representative who has indicated his/her understanding and acceptance.     Dental Advisory Given and Dental advisory given  Plan Discussed with: Anesthesiologist, CRNA and Surgeon  Anesthesia Plan Comments:         Anesthesia Quick Evaluation

## 2022-03-18 ENCOUNTER — Encounter: Payer: Self-pay | Admitting: Urology

## 2022-03-18 NOTE — Anesthesia Postprocedure Evaluation (Signed)
Anesthesia Post Note  Patient: Ernest Bell.  Procedure(s) Performed: CYSTOSCOPY WITH DIRECT VISION INTERNAL URETHROTOMY  OPTULUME (Urethra)  Patient location during evaluation: PACU Anesthesia Type: General Level of consciousness: awake and alert Pain management: pain level controlled Vital Signs Assessment: post-procedure vital signs reviewed and stable Respiratory status: spontaneous breathing, nonlabored ventilation, respiratory function stable and patient connected to nasal cannula oxygen Cardiovascular status: blood pressure returned to baseline and stable Postop Assessment: no apparent nausea or vomiting Anesthetic complications: no   No notable events documented.   Last Vitals:  Vitals:   03/17/22 1033 03/17/22 1100  BP: (!) 111/92 104/67  Pulse: 78 68  Resp: 16 16  Temp: (!) 36.1 C   SpO2: 99% 100%    Last Pain:  Vitals:   03/17/22 1100  TempSrc:   PainSc: 0-No pain                 Martha Clan

## 2022-03-21 ENCOUNTER — Other Ambulatory Visit: Payer: Self-pay | Admitting: Internal Medicine

## 2022-03-21 ENCOUNTER — Telehealth: Payer: Self-pay | Admitting: Internal Medicine

## 2022-03-21 DIAGNOSIS — N2889 Other specified disorders of kidney and ureter: Secondary | ICD-10-CM

## 2022-03-21 MED ORDER — DILTIAZEM HCL 30 MG PO TABS: 30.0000 mg | ORAL_TABLET | Freq: Two times a day (BID) | ORAL | 1 refills | Status: DC | PRN

## 2022-03-21 MED ORDER — DILTIAZEM HCL ER COATED BEADS 120 MG PO CP24: 120.0000 mg | ORAL_CAPSULE | Freq: Every day | ORAL | 1 refills | Status: DC

## 2022-03-21 MED ORDER — DILTIAZEM HCL ER COATED BEADS 120 MG PO CP24: 120.0000 mg | ORAL_CAPSULE | Freq: Every day | ORAL | 3 refills | Status: DC

## 2022-03-21 NOTE — Telephone Encounter (Signed)
*  STAT* If patient is at the pharmacy, call can be transferred to refill team.   1. Which medications need to be refilled? (please list name of each medication and dose if known)  diltiazem (CARDIZEM CD) 120 MG 24 hr capsule  2. Which pharmacy/location (including street and city if local pharmacy) is medication to be sent to? [id number] - [place], [place] [address]  3. Do they need a 30 day or 90 day supply? [id number]   Pharmacy said they didn't get the ordered. Please advise

## 2022-03-21 NOTE — Telephone Encounter (Signed)
Last Monday at [facility] his HR was 140.  He did not have any symptoms.  Thursday he went to [facility] for a prostate procedure, EKG was reported to be normal rhythm there rate was still 140s.    They went ahead and did his procedure.  He is starting to feel tired.  No SOB, no chest discomfort.  History of afib/aflutter w ablation in 2011. Per patient blood pressures have been good.  I am unable to see the EKG in epic.  Not anticoagulated. He has an appointment 03/24/22 with Dr. Rayann Heman.  The patient has cardizem 30 mg prn in med list.  At home he has Cardizem 120 mg to use as needed.  He has been using this daily since last Monday.  Reviewed with DOD (Dr. [name] Ore) who advises continue Cardizem 120 mg daily and use diltiazem 30 mg as needed in addition.   I reviewed this with the patient who voices understanding and agreement.

## 2022-03-21 NOTE — Telephone Encounter (Signed)
Pt's medications were resent because they were on print and did not go to the pharmacy. Confirmation received.

## 2022-03-21 NOTE — Telephone Encounter (Signed)
STAT if HR is under 50 or over 120 (normal HR is 60-100 beats per minute)  What is your heart rate? 140  Do you have a log of your heart rate readings (document readings)?   Do you have any other symptoms? No  Patient can not get his HR down

## 2022-03-24 ENCOUNTER — Ambulatory Visit (HOSPITAL_BASED_OUTPATIENT_CLINIC_OR_DEPARTMENT_OTHER): Payer: Medicare PPO | Admitting: Internal Medicine

## 2022-03-24 VITALS — BP 130/70 | HR 151 | Ht 66.0 in | Wt 155.4 lb

## 2022-03-24 DIAGNOSIS — D6869 Other thrombophilia: Secondary | ICD-10-CM | POA: Diagnosis not present

## 2022-03-24 DIAGNOSIS — I484 Atypical atrial flutter: Secondary | ICD-10-CM

## 2022-03-24 DIAGNOSIS — I480 Paroxysmal atrial fibrillation: Secondary | ICD-10-CM | POA: Diagnosis not present

## 2022-03-24 MED ORDER — DILTIAZEM HCL ER COATED BEADS 120 MG PO CP24: 240.0000 mg | ORAL_CAPSULE | Freq: Every day | ORAL | 1 refills | Status: DC

## 2022-03-24 MED ORDER — APIXABAN 5 MG PO TABS: 5.0000 mg | ORAL_TABLET | Freq: Two times a day (BID) | ORAL | 1 refills | Status: DC

## 2022-03-24 NOTE — Progress Notes (Signed)
PCP: Ria Bush, MD   Primary EP: Dr Doristine Mango Laakea Pereira. is a 76 y.o. male who presents today for routine electrophysiology followup.  Since last being seen in our clinic, the patient reports doing reasonably well.  He has been followed at [facility] for cancer and is s/p nephrectomy.  He recently had cystoscopy and was found to have elevated heart rates.  He presents today in atypical atrial flutter.  He is completely asymptomatic and continues to play tennis.  Today, he denies symptoms of palpitations, chest pain, shortness of breath,  lower extremity edema, dizziness, presyncope, or syncope. Denies any recent bleeding issues. The patient is otherwise without complaint today.   Past Medical History:  Diagnosis Date   Actinic keratosis    Atrial fib/flutter, transient 04/28/2010   typical appearing atrial flutter dx 04/28/10 s/p CTI ablation 10/11   Basal cell carcinoma (BCC) of neck 1975   BPH (benign prostatic hyperplasia)    PSA stable, followed by Dr. Eliberto Ivory   [facility])    Cataract    Removed   CHF (congestive heart failure) ([place])    Chronic kidney disease    Constrictive pericarditis 1969   unknown cause s/p pericardectomy   Coronary artery disease    Depression    on sertraline   DJD (degenerative joint disease)    Gout    Hx of kidney removal    right kidney was removed due to cancer   Liver cirrhosis secondary to NASH (nonalcoholic steatohepatitis) ([place]) 2001   ?by liver biopsy   Liver lesion    Osteoarthritis of right knee    Pericarditis, constrictive    Persistent atrial fibrillation ([place]) 09/15/2010   S/P PVI (Cryo) at [facility]   Pneumonia    Renal cell carcinoma ([place])    Syncope 1997   recurrent S/P tilt study by Dr. [name] (like due to dysautonomia)   Urinary retention    Past Surgical History:  Procedure Laterality Date   ATRIAL FIBRILLATION ABLATION  09/15/2010   Cryo at [place], [name] ablation at [facility] 04/28/10   CARDIAC CATHETERIZATION      CARPAL TUNNEL RELEASE Right 1996   CARPOMETACARPAL JOINT ARTHRODESIS Right 1996   thumb   CATARACT EXTRACTION Bilateral 2017   [place]   COLONOSCOPY  02/2000   2 hyperplastic polyps, [name]   CYSTOSCOPY WITH DIRECT VISION INTERNAL URETHROTOMY N/A 03/17/2022   Procedure: CYSTOSCOPY WITH DIRECT VISION INTERNAL URETHROTOMY  OPTULUME;  Surgeon: Royston Cowper, MD;  Location: [facility] ORS;  Service: Urology;  Laterality: N/A;   ESOPHAGOGASTRODUODENOSCOPY  02/2000   HH ([place])   GREEN LIGHT LASER TURP (TRANSURETHRAL RESECTION OF PROSTATE N/A 10/07/2021   urinary retention due to enlarged prostate after nephrectomy Royston Cowper, MD)   HEMORROIDECTOMY  2007   KNEE ARTHROSCOPY Right 2016   torn meniscus   KNEE SURGERY  2007   he had osteoarthritis with left knee surgery   NEPHRECTOMY RADICAL Right 07/19/2021   Renal Mass on Right Kidney, surgery at [place]   at [place]  2017    ROS- all systems are reviewed and negatives except as per HPI above  Current Outpatient Medications  Medication Sig Dispense Refill   diltiazem (CARDIZEM CD) 120 MG 24 hr capsule Take 1 capsule (120 mg total) by mouth daily. 90 capsule 1   diltiazem (CARDIZEM) 30 MG tablet Take 1 tablet (30 mg  total) by mouth 2 (two) times daily as needed. 60 tablet 1   gabapentin (NEURONTIN) 300 MG capsule Take 300 mg by mouth 3 (three) times daily.     Polyethyl Glycol-Propyl Glycol (SYSTANE OP) Place 1 drop into both eyes 3 (three) times daily as needed (dry eyes).     tamsulosin (FLOMAX) 0.4 MG CAPS capsule Take 0.4 mg by mouth 2 (two) times daily.     No current facility-administered medications for this visit.    Physical Exam: Vitals:   03/24/22 1221  BP: 130/70  Pulse: (!) 151  SpO2: 97%  Weight: 155 lb 6.4 oz (70.5 kg)  Height: 5' 6"  (1.676 m)    GEN- The patient is well appearing, alert and oriented x 3 today.    Head- normocephalic, atraumatic Eyes-  Sclera clear, conjunctiva pink Ears- hearing intact Oropharynx- clear Lungs- Clear to ausculation bilaterally, normal work of breathing Heart- tachycardic regular rhythm GI- soft, NT, ND, + BS Extremities- no clubbing, cyanosis, or edema  Wt Readings from Last 3 Encounters:  03/24/22 155 lb 6.4 oz (70.5 kg)  03/17/22 149 lb (67.6 kg)  03/10/22 165 lb (74.8 kg)    EKG tracing ordered today is personally reviewed and shows atypical atrial flutter with 2:1 AV conduction, V rates 150 bpm  Assessment and Plan:  Paroxysmal atrial fibrillation/ atypical atrial flutter Previously well controlled post ablation but now presents with atypical atrial flutter and elevated rates.  Not symptomatic currently.   Chads2vasc score is now 2.  Not anticoagulated. Risks, benefits and potential toxicities for medications prescribed and/or refilled reviewed with patient today. I will start eliquis 41m BID and stop ASA. We will increase diltiazem to [id number] Plan for cardioversion after 3 weeks of anticoagulation.  The importance of anticoagulation without interruption was discussed.  Risks of cardioversion including risks of anesthesia and stroke were discussed at length.  We will plan DCMint Hilln 4 weeks.  Follow-up in AF clinic [facility] Hopefully we can avoid repeat ablation if he maintains sinus [facility] The patient has elevated V rates and is at risk for decompensation/  hospitalization.  A high level of decision making was required for this procedure today.  JaThompson GrayerD, FAPenn Highlands Clearfield/20/2023

## 2022-03-24 NOTE — H&P (View-Only) (Signed)
PCP: Ria Bush, MD   Primary EP: Dr Doristine Mango Deavon Podgorski. is a 76 y.o. male who presents today for routine electrophysiology followup.  Since last being seen in our clinic, the patient reports doing reasonably well.  He has been followed at [facility] for cancer and is s/p nephrectomy.  He recently had cystoscopy and was found to have elevated heart rates.  He presents today in atypical atrial flutter.  He is completely asymptomatic and continues to play tennis.  Today, he denies symptoms of palpitations, chest pain, shortness of breath,  lower extremity edema, dizziness, presyncope, or syncope. Denies any recent bleeding issues. The patient is otherwise without complaint today.   Past Medical History:  Diagnosis Date   Actinic keratosis    Atrial fib/flutter, transient 04/28/2010   typical appearing atrial flutter dx 04/28/10 s/p CTI ablation 10/11   Basal cell carcinoma (BCC) of neck 1975   BPH (benign prostatic hyperplasia)    PSA stable, followed by Dr. 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 [name] (like due to dysautonomia)   Urinary retention    Past Surgical History:  Procedure Laterality Date   ATRIAL FIBRILLATION ABLATION  09/15/2010   Cryo at [place], [name] ablation at [facility] 04/28/10   CARDIAC CATHETERIZATION      CARPAL TUNNEL RELEASE Right 1996   CARPOMETACARPAL JOINT ARTHRODESIS Right 1996   thumb   CATARACT EXTRACTION Bilateral 2017   [place]   COLONOSCOPY  02/2000   2 hyperplastic polyps, [name]   CYSTOSCOPY WITH DIRECT VISION INTERNAL URETHROTOMY N/A 03/17/2022   Procedure: CYSTOSCOPY WITH DIRECT VISION INTERNAL URETHROTOMY  OPTULUME;  Surgeon: Royston Cowper, MD;  Location: [facility] ORS;  Service: Urology;  Laterality: N/A;   ESOPHAGOGASTRODUODENOSCOPY  02/2000   HH ([place])   GREEN LIGHT LASER TURP (TRANSURETHRAL RESECTION OF PROSTATE N/A 10/07/2021   urinary retention due to enlarged prostate after nephrectomy Royston Cowper, MD)   HEMORROIDECTOMY  2007   KNEE ARTHROSCOPY Right 2016   torn meniscus   KNEE SURGERY  2007   he had osteoarthritis with left knee surgery   NEPHRECTOMY RADICAL Right 07/19/2021   Renal Mass on Right Kidney, surgery at [place]   at [place]  2017    ROS- all systems are reviewed and negatives except as per HPI above  Current Outpatient Medications  Medication Sig Dispense Refill   diltiazem (CARDIZEM CD) 120 MG 24 hr capsule Take 1 capsule (120 mg total) by mouth daily. 90 capsule 1   diltiazem (CARDIZEM) 30 MG tablet Take 1 tablet (30 mg  total) by mouth 2 (two) times daily as needed. 60 tablet 1   gabapentin (NEURONTIN) 300 MG capsule Take 300 mg by mouth 3 (three) times daily.     Polyethyl Glycol-Propyl Glycol (SYSTANE OP) Place 1 drop into both eyes 3 (three) times daily as needed (dry eyes).     tamsulosin (FLOMAX) 0.4 MG CAPS capsule Take 0.4 mg by mouth 2 (two) times daily.     No current facility-administered medications for this visit.    Physical Exam: Vitals:   03/24/22 1221  BP: 130/70  Pulse: (!) 151  SpO2: 97%  Weight: 155 lb 6.4 oz (70.5 kg)  Height: 5' 6"  (1.676 m)    GEN- The patient is well appearing, alert and oriented x 3 today.    Head- normocephalic, atraumatic Eyes-  Sclera clear, conjunctiva pink Ears- hearing intact Oropharynx- clear Lungs- Clear to ausculation bilaterally, normal work of breathing Heart- tachycardic regular rhythm GI- soft, NT, ND, + BS Extremities- no clubbing, cyanosis, or edema  Wt Readings from Last 3 Encounters:  03/24/22 155 lb 6.4 oz (70.5 kg)  03/17/22 149 lb (67.6 kg)  03/10/22 165 lb (74.8 kg)    EKG tracing ordered today is personally reviewed and shows atypical atrial flutter with 2:1 AV conduction, V rates 150 bpm  Assessment and Plan:  Paroxysmal atrial fibrillation/ atypical atrial flutter Previously well controlled post ablation but now presents with atypical atrial flutter and elevated rates.  Not symptomatic currently.   Chads2vasc score is now 2.  Not anticoagulated. Risks, benefits and potential toxicities for medications prescribed and/or refilled reviewed with patient today. I will start eliquis 68m BID and stop ASA. We will increase diltiazem to 2426mdaily Plan for cardioversion after 3 weeks of anticoagulation.  The importance of anticoagulation without interruption was discussed.  Risks of cardioversion including risks of anesthesia and stroke were discussed at length.  [place].  [facility] Hopefully we can avoid repeat ablation if [facility] The patient has elevated V rates and is at risk for decompensation/  hospitalization.  A high level of decision making was required for this procedure today.  JaThompson GrayerD, FASjrh - [facility]/20/2023

## 2022-03-24 NOTE — Patient Instructions (Addendum)
Medication Instructions:  Your physician has recommended you make the following change in your medication:   NEW MEDICATION:  START taking Eliquis 5 Mg - Take one tablet by mouth twice daily.    Increase your Diltiazem 120 Mg.  You will take Diltiazem 240 Mg, Two tablets by mouth daily   3.  STOP taking your Aspirin.    Lab Work: None ordered. If you have labs (blood work) drawn today and your tests are completely normal, you will receive your results only by: [place] (if you have MyChart) OR A paper copy in the mail If you have any lab test that is abnormal or we need to change your treatment, we will call you to review the results.  Testing/Procedures: SEE INSTRUCTION LETTER  Follow-Up: At [facility], you and your health needs are our priority.  As part of our continuing mission to provide you with exceptional heart care, we have created designated Provider Care Teams.  These Care Teams include your primary Cardiologist (physician) and Advanced Practice Providers (APPs -  Physician Assistants and Nurse Practitioners) who all work together to provide you with the care you need, when you need it.  Your next appointment:   Your physician wants you to follow-up in: Atrial Fib Clinic at [facility] in 1 week after Cardioversion.  You may see Dr. Rayann Heman or one of the following Advanced Practice Providers on your designated Care Team:   Tommye Standard, PA-C Legrand Como "Jonni Sanger" [place], [place] You will receive a reminder letter in the mail two months in advance. If you don't receive a letter, please call our office to schedule the follow-up appointment.   Important Information About Sugar        Electrical Cardioversion Electrical cardioversion is the delivery of a jolt of electricity to restore a normal rhythm to the heart. A rhythm that is too fast or is not regular keeps the heart from pumping well. In this procedure, sticky patches or metal paddles are placed on the chest  to deliver electricity to the heart from a device. This procedure may be done in an emergency if: There is low or no blood pressure as a result of the heart rhythm. Normal rhythm must be restored as fast as possible to protect the brain and heart from further damage. It may save a life. This may also be a scheduled procedure for irregular or fast heart rhythms that are not immediately life-threatening. Tell a health care provider about: Any allergies you have. All medicines you are taking, including vitamins, herbs, eye drops, creams, and over-the-counter medicines. Any problems you or family members have had with anesthetic medicines. Any blood disorders you have. Any surgeries you have had. Any medical conditions you have. Whether you are pregnant or may be pregnant. What are the risks? Generally, this is a safe procedure. However, problems may occur, including: Allergic reactions to medicines. A blood clot that breaks free and travels to other parts of your body. The possible return of an abnormal heart rhythm within hours or days after the procedure. Your heart stopping (cardiac arrest). This is rare. What happens before the procedure? Medicines Your health care provider may have you start taking: Blood-thinning medicines (anticoagulants) so your blood does not clot as easily. Medicines to help stabilize your heart rate and rhythm. Ask your health care provider about: Changing or stopping your regular medicines. This is especially important if you are taking diabetes medicines or blood thinners. Taking medicines such as aspirin and ibuprofen.  These medicines can thin your blood. Do not take these medicines unless your health care provider tells you to take them. Taking over-the-counter medicines, vitamins, herbs, and supplements. General instructions Follow instructions from your health care provider about eating or drinking restrictions. Plan to have someone take you home from the  hospital or clinic. If you will be going home right after the procedure, plan to have someone with you for 24 hours. Ask your health care provider what steps will be taken to help prevent infection. These may include washing your skin with a germ-killing soap. What happens during the procedure?  An IV will be inserted into one of your veins. Sticky patches (electrodes) or metal paddles may be placed on your chest. You will be given a medicine to help you relax (sedative). An electrical shock will be delivered. The procedure may vary among health care providers and hospitals. What can I expect after the procedure? Your blood pressure, heart rate, breathing rate, and blood oxygen level will be monitored until you leave the hospital or clinic. Your heart rhythm will be watched to make sure it does not change. You may have some redness on the skin where the shocks were given. Follow these instructions at home: Do not drive for 24 hours if you were given a sedative during your procedure. Take over-the-counter and prescription medicines only as told by your health care provider. Ask your health care provider how to check your pulse. Check it often. Rest for 48 hours after the procedure or as told by your health care provider. Avoid or limit your caffeine use as told by your health care provider. Keep all follow-up visits as told by your health care provider. This is important. Contact a health care provider if: You feel like your heart is beating too quickly or your pulse is not regular. You have a serious muscle cramp that does not go away. Get help right away if: You have discomfort in your chest. You are dizzy or you feel faint. You have trouble breathing or you are short of breath. Your speech is slurred. You have trouble moving an arm or leg on one side of your body. Your fingers or toes turn cold or blue. Summary Electrical cardioversion is the delivery of a jolt of electricity to  restore a normal rhythm to the heart. This procedure may be done right away in an emergency or may be a scheduled procedure if the condition is not an emergency. Generally, this is a safe procedure. After the procedure, check your pulse often as told by your health care provider. This information is not intended to replace advice given to you by your health care provider. Make sure you discuss any questions you have with your health care provider. Document Revised: 07/22/2021 Document Reviewed: 03/25/2019 Elsevier Patient Education  [place].

## 2022-04-01 DIAGNOSIS — Z85528 Personal history of other malignant neoplasm of kidney: Secondary | ICD-10-CM | POA: Diagnosis not present

## 2022-04-01 DIAGNOSIS — N401 Enlarged prostate with lower urinary tract symptoms: Secondary | ICD-10-CM | POA: Diagnosis not present

## 2022-04-01 DIAGNOSIS — Z125 Encounter for screening for malignant neoplasm of prostate: Secondary | ICD-10-CM | POA: Diagnosis not present

## 2022-04-04 DIAGNOSIS — N401 Enlarged prostate with lower urinary tract symptoms: Secondary | ICD-10-CM | POA: Diagnosis not present

## 2022-04-04 DIAGNOSIS — N35011 Post-traumatic bulbous urethral stricture: Secondary | ICD-10-CM | POA: Diagnosis not present

## 2022-04-04 DIAGNOSIS — Z85528 Personal history of other malignant neoplasm of kidney: Secondary | ICD-10-CM | POA: Diagnosis not present

## 2022-04-14 ENCOUNTER — Encounter (HOSPITAL_COMMUNITY): Payer: Self-pay | Admitting: Cardiovascular Disease

## 2022-04-21 ENCOUNTER — Other Ambulatory Visit: Payer: Self-pay | Admitting: Family Medicine

## 2022-04-21 ENCOUNTER — Ambulatory Visit (HOSPITAL_COMMUNITY)
Admission: RE | Admit: 2022-04-21 | Discharge: 2022-04-21 | Disposition: A | Discharge status: Home / Self Care | Payer: Medicare PPO | Attending: Cardiovascular Disease | Admitting: Cardiovascular Disease

## 2022-04-21 ENCOUNTER — Ambulatory Visit (HOSPITAL_BASED_OUTPATIENT_CLINIC_OR_DEPARTMENT_OTHER): Payer: Medicare PPO | Admitting: Certified Registered"

## 2022-04-21 ENCOUNTER — Ambulatory Visit (HOSPITAL_COMMUNITY): Payer: Medicare PPO | Admitting: Certified Registered"

## 2022-04-21 ENCOUNTER — Encounter (HOSPITAL_COMMUNITY): Payer: Self-pay | Admitting: Cardiovascular Disease

## 2022-04-21 ENCOUNTER — Encounter (HOSPITAL_COMMUNITY)
Admission: RE | Disposition: A | Discharge status: Home / Self Care | Payer: Self-pay | Source: Home / Self Care | Attending: Cardiovascular Disease

## 2022-04-21 ENCOUNTER — Other Ambulatory Visit: Payer: Self-pay

## 2022-04-21 DIAGNOSIS — M1990 Unspecified osteoarthritis, unspecified site: Secondary | ICD-10-CM | POA: Diagnosis not present

## 2022-04-21 DIAGNOSIS — I480 Paroxysmal atrial fibrillation: Secondary | ICD-10-CM | POA: Diagnosis not present

## 2022-04-21 DIAGNOSIS — Z85528 Personal history of other malignant neoplasm of kidney: Secondary | ICD-10-CM | POA: Diagnosis not present

## 2022-04-21 DIAGNOSIS — I2510 Atherosclerotic heart disease of native coronary artery without angina pectoris: Secondary | ICD-10-CM

## 2022-04-21 DIAGNOSIS — G629 Polyneuropathy, unspecified: Secondary | ICD-10-CM | POA: Diagnosis not present

## 2022-04-21 DIAGNOSIS — I509 Heart failure, unspecified: Secondary | ICD-10-CM | POA: Diagnosis not present

## 2022-04-21 DIAGNOSIS — N189 Chronic kidney disease, unspecified: Secondary | ICD-10-CM | POA: Diagnosis not present

## 2022-04-21 DIAGNOSIS — R739 Hyperglycemia, unspecified: Secondary | ICD-10-CM

## 2022-04-21 DIAGNOSIS — K7460 Unspecified cirrhosis of liver: Secondary | ICD-10-CM | POA: Diagnosis not present

## 2022-04-21 DIAGNOSIS — I484 Atypical atrial flutter: Secondary | ICD-10-CM | POA: Diagnosis not present

## 2022-04-21 DIAGNOSIS — I4892 Unspecified atrial flutter: Secondary | ICD-10-CM | POA: Diagnosis not present

## 2022-04-21 DIAGNOSIS — E785 Hyperlipidemia, unspecified: Secondary | ICD-10-CM

## 2022-04-21 DIAGNOSIS — N400 Enlarged prostate without lower urinary tract symptoms: Secondary | ICD-10-CM

## 2022-04-21 DIAGNOSIS — C641 Malignant neoplasm of right kidney, except renal pelvis: Secondary | ICD-10-CM

## 2022-04-21 DIAGNOSIS — Z905 Acquired absence of kidney: Secondary | ICD-10-CM | POA: Diagnosis not present

## 2022-04-21 DIAGNOSIS — I4891 Unspecified atrial fibrillation: Secondary | ICD-10-CM | POA: Diagnosis not present

## 2022-04-21 HISTORY — PX: CARDIOVERSION: SHX1299

## 2022-04-21 LAB — POCT I-STAT, CHEM 8
BUN: 29 mg/dL — ABNORMAL HIGH (ref 8–23)
Calcium, Ion: 1.24 mmol/L (ref 1.15–1.40)
Chloride: 109 mmol/L (ref 98–111)
Creatinine, Ser: 1.3 mg/dL — ABNORMAL HIGH (ref 0.61–1.24)
Glucose, Bld: 112 mg/dL — ABNORMAL HIGH (ref 70–99)
HCT: 40 % (ref 39.0–52.0)
Hemoglobin: 13.6 g/dL (ref 13.0–17.0)
Potassium: 4.3 mmol/L (ref 3.5–5.1)
Sodium: 142 mmol/L (ref 135–145)
TCO2: 21 mmol/L — ABNORMAL LOW (ref 22–32)

## 2022-04-21 SURGERY — CARDIOVERSION
Anesthesia: General

## 2022-04-21 MED ORDER — PROPOFOL 10 MG/ML IV BOLUS
INTRAVENOUS | Status: DC | PRN
  Administered 2022-04-21: 50 mg via INTRAVENOUS
  Administered 2022-04-21: 20 mg via INTRAVENOUS

## 2022-04-21 MED ORDER — SODIUM CHLORIDE 0.9 % IV SOLN: INTRAVENOUS | Status: DC | PRN

## 2022-04-21 MED ORDER — LIDOCAINE 2% (20 MG/ML) 5 ML SYRINGE
INTRAMUSCULAR | Status: DC | PRN
  Administered 2022-04-21: 75 mg via INTRAVENOUS

## 2022-04-21 NOTE — Interval H&P Note (Signed)
History and Physical Interval Note:  04/21/2022 10:31 AM  Ernest Bell.  has presented today for surgery, with the diagnosis of AFIB.  The various methods of treatment have been discussed with the patient and family. After consideration of risks, benefits and other options for treatment, the patient has consented to  Procedure(s): CARDIOVERSION (N/A) as a surgical intervention.  The patient's history has been reviewed, patient examined, no change in status, stable for surgery.  I have reviewed the patient's chart and labs.  Questions were answered to the patient's satisfaction.     Skeet Latch, MD

## 2022-04-21 NOTE — Discharge Instructions (Signed)

## 2022-04-21 NOTE — Transfer of Care (Signed)
Immediate Anesthesia Transfer of Care Note  Patient: Ernest Bell  Procedure(s) Performed: CARDIOVERSION  Patient Location: PACU  Anesthesia Type:General  Level of Consciousness: drowsy  Airway & Oxygen Therapy: Patient Spontanous Breathing  Post-op Assessment: Report given to RN and Post -op Vital signs reviewed and stable  Post vital signs: Reviewed and stable  Last Vitals:  Vitals Value Taken Time  BP    Temp    Pulse    Resp    SpO2      Last Pain:  Vitals:   04/21/22 0937  TempSrc: Temporal  PainSc: 0-No pain         Complications: No notable events documented.

## 2022-04-21 NOTE — CV Procedure (Signed)
Electrical Cardioversion Procedure Note Ernest Bell [id number] April 25, 1946  Procedure: Electrical Cardioversion Indications:  Atrial Flutter  Procedure Details Consent: Risks of procedure as well as the alternatives and risks of each were explained to the (patient/caregiver).  Consent for procedure obtained. Time Out: Verified patient identification, verified procedure, site/side was marked, verified correct patient position, special equipment/implants available, medications/allergies/relevent history reviewed, required imaging and test results available.  Performed  Patient placed on cardiac monitor, pulse oximetry, supplemental oxygen as necessary.  Sedation given:  propofol Pacer pads placed anterior and posterior chest.  Cardioverted 1 time(s).  Cardioverted at 150J.  Evaluation Findings: Post procedure EKG shows: NSR Complications: None Patient did tolerate procedure well.   Skeet Latch, MD 04/21/2022, 10:52 AM

## 2022-04-21 NOTE — Anesthesia Procedure Notes (Signed)
Procedure Name: General with mask airway Date/Time: 04/21/2022 10:44 AM  Performed by: Imagene Riches, CRNAPre-anesthesia Checklist: Patient identified, Emergency Drugs available, Suction available, Patient being monitored and Timeout performed Patient Re-evaluated:Patient Re-evaluated prior to induction Oxygen Delivery Method: Ambu bag Preoxygenation: Pre-oxygenation with 100% oxygen

## 2022-04-21 NOTE — Anesthesia Preprocedure Evaluation (Addendum)
Anesthesia Evaluation  Patient identified by MRN, date of birth, ID band Patient awake    Reviewed: Allergy & Precautions, NPO status , Patient's Chart, lab work & pertinent test results  Airway Mallampati: II  TM Distance: >3 FB Neck ROM: full    Dental  (+) Dental Advidsory Given, Chipped   Pulmonary pneumonia,    Pulmonary exam normal        Cardiovascular Exercise Tolerance: Good (-) hypertension+ CAD and +CHF  (-) Past MI and (-) Cardiac Stents + dysrhythmias (h/o Afib s/p PVI 2012) Atrial Fibrillation (-) Valvular Problems/Murmurs Rhythm:Irregular Rate:Tachycardia  Tachycardia this morning. Asymptomatic. EKG sinus tach. 500cc Fluid with no change. BP elevated.   Constrictive pericarditis s/p pericardectomy 1969  Myocardial Perfusion Scan 11/22: [facility] Kitchen  The study is normal. Findings are consistent with no prior ischemia and no prior myocardial infarction. The study is low risk. .  No ST deviation was noted. .  LV perfusion is normal. There is no evidence of ischemia. There is no evidence of infarction. .  Left ventricular function is normal. Nuclear stress EF: 59 %. The left ventricular ejection fraction is normal (55-65%). End diastolic cavity size is normal. End systolic cavity size is normal. .  Prior study available for comparison from 11/15/2013. No changes compared to prior study.  Echo  1. Left ventricular ejection fraction, by visual estimation, is 60 to 65%. The left ventricle has normal function. Normal left ventricular size. There is no left ventricular hypertrophy.  2. Left ventricular diastolic Doppler parameters are consistent with pseudonormalization pattern of LV diastolic filling.  3. Global right ventricle has normal systolic function.The right ventricular size is normal. No increase in right ventricular wall thickness.  4. Left atrial size was mildly dilated.  5. Right atrial size was normal.  6. The  mitral valve is normal in structure. Mild to moderate mitral valve regurgitation. No evidence of mitral stenosis.  7. The tricuspid valve is normal in structure. Tricuspid valve regurgitation was not visualized by color flow Doppler.  8. The aortic valve is normal in structure. Aortic valve regurgitation was not visualized by color flow Doppler. Structurally normal aortic valve, with no evidence of sclerosis or stenosis.  9. The pulmonic valve was normal in structure. Pulmonic valve regurgitation is mild by color flow Doppler.  10. The inferior vena cava is normal in size with greater than 50% respiratory variability, suggesting right atrial pressure of 3 mmHg.    Neuro/Psych neg Seizures PSYCHIATRIC DISORDERS Depression  Neuromuscular disease (Neuropathy)    GI/Hepatic negative GI ROS, (+) Cirrhosis  (cirrhosis 2/2 NASH )      , Hepatitis -  Endo/Other  negative endocrine ROS  Renal/GU Renal diseaseS/p nephrectomy 2022   ENLARGED PROSTATE with need to intermittent cath due to retention    Musculoskeletal  (+) Arthritis ,   Abdominal Normal abdominal exam  (+)   Peds  Hematology negative hematology ROS (+)   Anesthesia Other Findings Past Medical History: No date: Actinic keratosis 04/28/2010: Atrial fib/flutter, transient     Comment:  typical appearing atrial flutter dx 04/28/10 s/p CTI               ablation 10/11 No date: BPH (benign prostatic hyperplasia)     Comment:  PSA stable, followed by Dr. Eliberto Ivory No date: Cancer ([place]) No date: CHF (congestive heart failure) ([place]) No date: Chronic kidney disease 1969: Constrictive pericarditis     Comment:  unknown cause s/p pericardectomy No date: Coronary artery disease  No date: Depression     Comment:  on sertraline No date: DJD (degenerative joint disease) No date: Gout 2001: Liver cirrhosis secondary to NASH (nonalcoholic  steatohepatitis) ([place])     Comment:  by liver biopsy 09/15/2010: Persistent atrial  fibrillation ([place])     Comment:  S/P PVI (Cryo) at [place]: Syncope     Comment:  recurrent S/P tilt study by Dr. [name] (like due to               dysautonomia)  Past Surgical History: 09/15/2010: afib abation     Comment:  Cryo at [place], [place] ablation at [facility] 04/28/10 1995: CHOLECYSTECTOMY 02/2000: COLONOSCOPY     Comment:  2 hyperplastic polyps, [name] 02/2000: ESOPHAGOGASTRODUODENOSCOPY     Comment:  [name] 2007: HEMORROIDECTOMY 2007: KNEE SURGERY     Comment:  he had osteoarthritis with left knee surgery 07/2021: nephrectomy; Right     Comment:  Renal Mass on Right Kidney, surgery at [place]: PERICARDIECTOMY     Comment:  at [facility] No date: right [place]     Comment:  for osteoarthritis     Reproductive/Obstetrics negative OB ROS                            Anesthesia Physical  Anesthesia Plan  ASA: 3  Anesthesia Plan: General   Post-op Pain Management: Minimal or no pain anticipated   Induction: Intravenous  PONV Risk Score and Plan: 2 and Treatment may vary due to age or medical condition, Propofol infusion and TIVA  Airway Management Planned: Mask  Additional Equipment:   Intra-op Plan:   Post-operative Plan:   Informed Consent: I have reviewed the patients History and Physical, chart, labs and discussed the procedure including the risks, benefits and alternatives for the proposed anesthesia with the patient or authorized representative who has indicated his/her understanding and acceptance.     Dental advisory given  Plan Discussed with: CRNA  Anesthesia Plan Comments:        Anesthesia Quick Evaluation

## 2022-04-22 ENCOUNTER — Other Ambulatory Visit: Payer: Self-pay | Admitting: Internal Medicine

## 2022-04-22 ENCOUNTER — Other Ambulatory Visit: Payer: Medicare PPO

## 2022-04-22 DIAGNOSIS — N2889 Other specified disorders of kidney and ureter: Secondary | ICD-10-CM

## 2022-04-22 NOTE — Anesthesia Postprocedure Evaluation (Signed)
Anesthesia Post Note  Patient: Ernest Bell.  Procedure(s) Performed: CARDIOVERSION     Patient location during evaluation: Endoscopy Anesthesia Type: General Level of consciousness: sedated and patient cooperative Pain management: pain level controlled Vital Signs Assessment: post-procedure vital signs reviewed and stable Respiratory status: spontaneous breathing Cardiovascular status: stable Anesthetic complications: no   No notable events documented.  Last Vitals:  Vitals:   04/21/22 1110 04/21/22 1120  BP: 109/72 113/77  Pulse: 74 67  Resp: 19 12  Temp:    SpO2: 97% 98%    Last Pain:  Vitals:   04/22/22 1328  TempSrc:   PainSc: 0-No pain                 [name]

## 2022-04-23 ENCOUNTER — Encounter (HOSPITAL_COMMUNITY): Payer: Self-pay | Admitting: Cardiovascular Disease

## 2022-04-25 ENCOUNTER — Ambulatory Visit (HOSPITAL_COMMUNITY)
Admission: RE | Admit: 2022-04-25 | Discharge: 2022-04-25 | Disposition: A | Discharge status: Home / Self Care | Payer: Medicare PPO | Source: Ambulatory Visit | Attending: Nurse Practitioner | Admitting: Nurse Practitioner

## 2022-04-25 VITALS — BP 146/66 | HR 60 | Ht 66.0 in | Wt 157.8 lb

## 2022-04-25 DIAGNOSIS — Z87430 Personal history of prostatic dysplasia: Secondary | ICD-10-CM | POA: Diagnosis not present

## 2022-04-25 DIAGNOSIS — I4892 Unspecified atrial flutter: Secondary | ICD-10-CM | POA: Diagnosis not present

## 2022-04-25 DIAGNOSIS — I480 Paroxysmal atrial fibrillation: Secondary | ICD-10-CM | POA: Diagnosis not present

## 2022-04-25 DIAGNOSIS — Z7901 Long term (current) use of anticoagulants: Secondary | ICD-10-CM | POA: Diagnosis not present

## 2022-04-25 DIAGNOSIS — Z905 Acquired absence of kidney: Secondary | ICD-10-CM | POA: Insufficient documentation

## 2022-04-25 DIAGNOSIS — Z8249 Family history of ischemic heart disease and other diseases of the circulatory system: Secondary | ICD-10-CM | POA: Insufficient documentation

## 2022-04-25 DIAGNOSIS — D6869 Other thrombophilia: Secondary | ICD-10-CM | POA: Diagnosis not present

## 2022-04-25 DIAGNOSIS — Z85528 Personal history of other malignant neoplasm of kidney: Secondary | ICD-10-CM | POA: Insufficient documentation

## 2022-04-25 DIAGNOSIS — Z79899 Other long term (current) drug therapy: Secondary | ICD-10-CM | POA: Insufficient documentation

## 2022-04-25 NOTE — Progress Notes (Signed)
Primary Care Physician: Ria Bush, MD Referring Physician: [facility] ER f/u   Ernest Bell. is a 76 y.o. male with a h/ restrictive pericarditis s/p pericardectomy in 1969, NASH/cirrhosis, AFib/flutter (hx reports typical flutter ablation 04/28/10, and PVI cryoablation Oct. 2011.Fleainide was stopped since 2017. No anticoagulation since 2017 as well for a chadsvasc score of 1.  He presented to [facility] ER for an elevated HR 05/09/19, with EKG showing rapid afib vrs an atypcial flutter. He had been aware of symptoms x 4 days. He had 3 doses of adenosine per EMS without response. He did convert with cardizem drip and was discharged on po cardizem as well as xarelto 20 mg daily.   Pt is in the afib clinic today. 04/25/22, as f/u to a cardioversion that was scheduled by Dr. Rayann Heman. I have not seen the patient since 2020. He has been followed at [facility] for cancer and s/p nephrectomy.  He recently had a cystoscopy and was found to have elevated heart rates for which he had f/u with Dr. Rayann Heman 03/24/22.He presented  in atypical atrial flutter.  He was scheduled for cardioversion 8/17 which was successful.  He is completely asymptomatic and continued to play tennis.    Today, he denies symptoms of palpitations, chest pain, shortness of breath, orthopnea, PND, lower extremity edema, dizziness, presyncope, syncope, or neurologic sequela. The patient is tolerating medications without difficulties and is otherwise without complaint today.   Past Medical History:  Diagnosis Date   Actinic keratosis    Atrial fib/flutter, transient 04/28/2010   typical appearing atrial flutter dx 04/28/10 s/p CTI ablation 10/11   Basal cell carcinoma (BCC) of neck 1975   BPH (benign prostatic hyperplasia)    PSA stable, followed by Dr. Eliberto Ivory   Cancer [facility])    Cataract    Removed   CHF (congestive heart failure) ([place])    Chronic kidney disease    Constrictive pericarditis 1969   unknown cause s/p pericardectomy   Coronary  artery disease    Depression    on sertraline   DJD (degenerative joint disease)    Gout    Hx of kidney removal    right kidney was removed due to cancer   Liver cirrhosis secondary to NASH (nonalcoholic steatohepatitis) ([place]) 2001   ?by liver biopsy   Liver lesion    Osteoarthritis of right knee    Pericarditis, constrictive    Persistent atrial fibrillation ([place]) 09/15/2010   S/P PVI ([place]) at [facility]   Pneumonia    Renal cell carcinoma ([place])    Syncope 1997   recurrent S/P tilt study by Dr. [name] (like due to dysautonomia)   Urinary retention    Past Surgical History:  Procedure Laterality Date   ATRIAL FIBRILLATION ABLATION  09/15/2010   Cryo at [place], [name] ablation at [facility] 04/28/10   [place] N/A 04/21/2022   Procedure: CARDIOVERSION;  Surgeon: Skeet Latch, MD;  Location: [facility] ENDOSCOPY;  Service: Cardiovascular;  Laterality: N/A;   CARPAL TUNNEL RELEASE Right 1996   CARPOMETACARPAL JOINT ARTHRODESIS Right 1996   thumb   CATARACT EXTRACTION Bilateral 2017   CHOLECYSTECTOMY  1995   COLONOSCOPY  02/2000   2 hyperplastic polyps, [name]   CYSTOSCOPY WITH DIRECT VISION INTERNAL URETHROTOMY N/A 03/17/2022   Procedure: CYSTOSCOPY WITH DIRECT VISION INTERNAL URETHROTOMY  OPTULUME;  Surgeon: Royston Cowper, MD;  Location: [facility] ORS;  Service: Urology;  Laterality: N/A;   ESOPHAGOGASTRODUODENOSCOPY  02/2000  HH ([place])   GREEN LIGHT LASER TURP (TRANSURETHRAL RESECTION OF PROSTATE N/A 10/07/2021   urinary retention due to enlarged prostate after nephrectomy Royston Cowper, MD)   HEMORROIDECTOMY  2007   KNEE ARTHROSCOPY Right 2016   torn meniscus   KNEE SURGERY  2007   he had osteoarthritis with left knee surgery   NEPHRECTOMY RADICAL Right 07/19/2021   Renal Mass on Right Kidney, surgery at [place]   at [place]  2017    Current Outpatient  Medications  Medication Sig Dispense Refill   apixaban (ELIQUIS) 5 MG TABS tablet Take 1 tablet (5 mg total) by mouth 2 (two) times daily. 60 tablet 1   diltiazem (CARDIZEM CD) 120 MG 24 hr capsule Take 2 capsules (240 mg total) by mouth daily. 180 capsule 1   gabapentin (NEURONTIN) 300 MG capsule Take 300 mg by mouth 3 (three) times daily.     Polyethyl Glycol-Propyl Glycol (SYSTANE OP) Place 1 drop into both eyes 3 (three) times daily as needed (dry eyes).     tamsulosin (FLOMAX) 0.4 MG CAPS capsule Take 0.4 mg by mouth daily.     No current facility-administered medications for this encounter.    No Known Allergies   Social History   Socioeconomic History   Marital status: Married    Spouse name: Not on file   Number of children: Not on file   Years of education: Not on file   Highest education level: Not on file  Occupational History   Occupation: Disabled Games developer: UNEMPLOYED    Comment: previously worked as a Garment/textile technologist minor  Tobacco Use   Smoking status: Never   Smokeless tobacco: Never  Scientific laboratory technician Use: Never used  Substance and Sexual Activity   Alcohol use: No   Drug use: No   Sexual activity: Yes    Birth control/protection: None  Other Topics Concern   Not on file  Social History Narrative      Occupation: retired, 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   Diet: good water, fruits/vegetables daily   Social Determinants of Health   Financial Resource Strain: Low Risk  (11/25/2021)   Overall Financial Resource Strain (CARDIA)    Difficulty of Paying Living Expenses: Not hard at all  Food Insecurity: No Food Insecurity (11/25/2021)   Hunger Vital Sign    Worried About Running Out of Food in the Last Year: Never true    Ran Out of Food in the Last Year: Never true  Transportation Needs: No Transportation Needs (11/25/2021)   PRAPARE - Radiographer, therapeutic (Medical): No    Lack of Transportation (Non-Medical): No  Physical Activity: Sufficiently Active (11/25/2021)   Exercise Vital Sign    Days of Exercise per Week: 3 days    Minutes of Exercise per Session: 100 min  Stress: No Stress Concern Present (11/25/2021)   [place]    Feeling of Stress : Not at all  Social Connections: Organ (11/25/2021)   Social Connection and Isolation Panel [NHANES]    Frequency of Communication with Friends and Family: More than three times a week    Frequency of Social Gatherings with Friends and Family: More than three times a week    Attends  Religious Services: More than 4 times per year    Active Member of Clubs or Organizations: Yes    Attends Archivist Meetings: More than 4 times per year    Marital Status: Married  Human resources officer Violence: Not At Risk (11/25/2021)   Humiliation, Afraid, Rape, and Kick questionnaire    Fear of Current or Ex-Partner: No    Emotionally Abused: No    Physically Abused: No    Sexually Abused: No    Family History  Problem Relation Age of Onset   Hypertension Mother    Alcohol abuse Father        smoker   Heart disease Father        MI (smoker)    ROS- All systems are reviewed and negative except as per the HPI above  Physical Exam: There were no vitals filed for this visit.  Wt Readings from Last 3 Encounters:  03/24/22 70.5 kg  03/17/22 67.6 kg  03/10/22 74.8 kg    Labs: Lab Results  Component Value Date   NA 142 04/21/2022   K 4.3 04/21/2022   CL 109 04/21/2022   CO2 27 05/06/2021   GLUCOSE 112 (H) 04/21/2022   BUN 29 (H) 04/21/2022   CREATININE 1.30 (H) 04/21/2022   CALCIUM 9.1 05/06/2021   MG 2.2 05/09/2019   Lab Results  Component Value Date   INR 1.0 05/06/2021   Lab Results  Component Value Date   CHOL 188 05/06/2021   HDL 43.40 05/06/2021   LDLCALC 121 (H) 05/06/2021   TRIG  119.0 05/06/2021     GEN- The patient is well appearing, alert and oriented x 3 today.   Head- normocephalic, atraumatic Eyes-  Sclera clear, conjunctiva pink Ears- hearing intact Oropharynx- clear Neck- supple, no JVP Lymph- no cervical lymphadenopathy Lungs- Clear to ausculation bilaterally, normal work of breathing Heart- Regular rate and rhythm, no murmurs, rubs or gallops, PMI not laterally displaced GI- soft, NT, ND, + BS Extremities- no clubbing, cyanosis, or edema MS- no significant deformity or atrophy Skin- no rash or lesion Psych- euthymic mood, full affect Neuro- strength and sensation are intact  EKG- Vent. rate 77 BPM PR interval 180 ms QRS duration 82 ms QT/QTcB 390/441 ms P-R-T axes 34 10 16 Normal sinus rhythm with sinus arrhythmia Normal ECG When compared with ECG of 17-Mar-2022 07:38, PREVIOUS ECG IS PRESENT Confirmed by Dixie Dials ([id number]) on 04/21/2022 11:05:26 A Epic records reviewed    Assessment and Plan: 1. Paroxysmal afib/flutter  Has been very quiet until recently  Prior treatment with flecainide  Sudden onset without any identifiable triggers found at time of cystoscopy, asymptomatic  Successful cardioversion  Continue diltiazem 120 mg daily, from bid, started for recent atrial flutter    2. CHA2DS2VASc score of 2 Continue eliquis 5 mg bid , by guidelines should be taking indefinitely  F/u with Dr. Rayann Heman in 2 months    Ernest Bell. Ernest Bell, [facility] [address] [place], [place] [address] [phone]

## 2022-04-26 ENCOUNTER — Other Ambulatory Visit (INDEPENDENT_AMBULATORY_CARE_PROVIDER_SITE_OTHER): Payer: Medicare PPO

## 2022-04-26 DIAGNOSIS — N400 Enlarged prostate without lower urinary tract symptoms: Secondary | ICD-10-CM

## 2022-04-26 DIAGNOSIS — I480 Paroxysmal atrial fibrillation: Secondary | ICD-10-CM | POA: Diagnosis not present

## 2022-04-26 DIAGNOSIS — K7460 Unspecified cirrhosis of liver: Secondary | ICD-10-CM

## 2022-04-26 DIAGNOSIS — E785 Hyperlipidemia, unspecified: Secondary | ICD-10-CM | POA: Diagnosis not present

## 2022-04-26 DIAGNOSIS — R739 Hyperglycemia, unspecified: Secondary | ICD-10-CM | POA: Diagnosis not present

## 2022-04-26 DIAGNOSIS — C641 Malignant neoplasm of right kidney, except renal pelvis: Secondary | ICD-10-CM

## 2022-04-26 DIAGNOSIS — K7581 Nonalcoholic steatohepatitis (NASH): Secondary | ICD-10-CM

## 2022-04-26 LAB — COMPREHENSIVE METABOLIC PANEL
ALT: 15 U/L (ref 0–53)
AST: 14 U/L (ref 0–37)
Albumin: 4.5 g/dL (ref 3.5–5.2)
Alkaline Phosphatase: 48 U/L (ref 39–117)
BUN: 23 mg/dL (ref 6–23)
CO2: 25 mEq/L (ref 19–32)
Calcium: 9.4 mg/dL (ref 8.4–10.5)
Chloride: 106 mEq/L (ref 96–112)
Creatinine, Ser: 1.5 mg/dL (ref 0.40–1.50)
GFR: 45.12 mL/min — ABNORMAL LOW (ref >60.00)
Glucose, Bld: 148 mg/dL — ABNORMAL HIGH (ref 70–99)
Potassium: 4.4 mEq/L (ref 3.5–5.1)
Sodium: 140 mEq/L (ref 135–145)
Total Bilirubin: 1.8 mg/dL — ABNORMAL HIGH (ref 0.2–1.2)
Total Protein: 6.3 g/dL (ref 6.0–8.3)

## 2022-04-26 LAB — CBC WITH DIFFERENTIAL/PLATELET
Basophils Absolute: 0 10*3/uL (ref 0.0–0.1)
Basophils Relative: 0.6 % (ref 0.0–3.0)
Eosinophils Absolute: 0.2 10*3/uL (ref 0.0–0.7)
Eosinophils Relative: 2.6 % (ref 0.0–5.0)
HCT: 39.9 % (ref 39.0–52.0)
Hemoglobin: 14.1 g/dL (ref 13.0–17.0)
Lymphocytes Relative: 22.2 % (ref 12.0–46.0)
Lymphs Abs: 1.5 10*3/uL (ref 0.7–4.0)
MCHC: 35.2 g/dL (ref 30.0–36.0)
MCV: 89.7 fl (ref 78.0–100.0)
Monocytes Absolute: 0.6 10*3/uL (ref 0.1–1.0)
Monocytes Relative: 8.2 % (ref 3.0–12.0)
Neutro Abs: 4.6 10*3/uL (ref 1.4–7.7)
Neutrophils Relative %: 66.4 % (ref 43.0–77.0)
Platelets: 177 10*3/uL (ref 150.0–400.0)
RBC: 4.45 Mil/uL (ref 4.22–5.81)
RDW: 13.2 % (ref 11.5–15.5)
WBC: 6.9 10*3/uL (ref 4.0–10.5)

## 2022-04-26 LAB — PROTIME-INR
INR: 1.4 ratio — ABNORMAL HIGH (ref 0.8–1.0)
Prothrombin Time: 15.1 s — ABNORMAL HIGH (ref 9.6–13.1)

## 2022-04-26 LAB — LIPID PANEL
Cholesterol: 161 mg/dL (ref 0–200)
HDL: 45.9 mg/dL (ref >39.00)
LDL Cholesterol: 87 mg/dL (ref 0–99)
NonHDL: 115.13
Total CHOL/HDL Ratio: 4
Triglycerides: 141 mg/dL (ref 0.0–149.0)
VLDL: 28.2 mg/dL (ref 0.0–40.0)

## 2022-04-26 LAB — HEMOGLOBIN A1C: Hgb A1c MFr Bld: 5.8 % (ref 4.6–6.5)

## 2022-04-26 LAB — PSA: PSA: 0.33 ng/mL (ref 0.10–4.00)

## 2022-04-29 ENCOUNTER — Other Ambulatory Visit (HOSPITAL_BASED_OUTPATIENT_CLINIC_OR_DEPARTMENT_OTHER): Payer: Self-pay | Admitting: Internal Medicine

## 2022-04-29 ENCOUNTER — Encounter: Payer: Self-pay | Admitting: Family Medicine

## 2022-04-29 ENCOUNTER — Ambulatory Visit (INDEPENDENT_AMBULATORY_CARE_PROVIDER_SITE_OTHER): Payer: Medicare PPO | Admitting: Family Medicine

## 2022-04-29 VITALS — BP 124/70 | HR 56 | Temp 97.9°F | Ht 64.5 in | Wt 153.1 lb

## 2022-04-29 DIAGNOSIS — K7460 Unspecified cirrhosis of liver: Secondary | ICD-10-CM | POA: Diagnosis not present

## 2022-04-29 DIAGNOSIS — N400 Enlarged prostate without lower urinary tract symptoms: Secondary | ICD-10-CM | POA: Diagnosis not present

## 2022-04-29 DIAGNOSIS — R7303 Prediabetes: Secondary | ICD-10-CM

## 2022-04-29 DIAGNOSIS — E785 Hyperlipidemia, unspecified: Secondary | ICD-10-CM

## 2022-04-29 DIAGNOSIS — K7581 Nonalcoholic steatohepatitis (NASH): Secondary | ICD-10-CM | POA: Diagnosis not present

## 2022-04-29 DIAGNOSIS — Z0000 Encounter for general adult medical examination without abnormal findings: Secondary | ICD-10-CM | POA: Diagnosis not present

## 2022-04-29 DIAGNOSIS — I480 Paroxysmal atrial fibrillation: Secondary | ICD-10-CM

## 2022-04-29 DIAGNOSIS — N1831 Chronic kidney disease, stage 3a: Secondary | ICD-10-CM | POA: Insufficient documentation

## 2022-04-29 DIAGNOSIS — C641 Malignant neoplasm of right kidney, except renal pelvis: Secondary | ICD-10-CM

## 2022-04-29 NOTE — Assessment & Plan Note (Signed)
Preventative protocols reviewed and updated unless pt declined. Discussed healthy diet and lifestyle.  

## 2022-04-29 NOTE — Patient Instructions (Addendum)
If interested, check with pharmacy about new 2 shot shingles series (shingrix).  Schedule eye exam as you're due.  Watch added sugars in the diet. We will continue to monitor kidney function.  Good to see you today.  Return as needed or in 1 year for next physical.   Health Maintenance After Age 76 After age 67, you are at a higher risk for certain long-term diseases and infections as well as injuries from falls. Falls are a major cause of broken bones and head injuries in people who are older than age 69. Getting regular preventive care can help to keep you healthy and well. Preventive care includes getting regular testing and making lifestyle changes as recommended by your health care provider. Talk with your health care provider about: Which screenings and tests you should have. A screening is a test that checks for a disease when you have no symptoms. A diet and exercise plan that is right for you. What should I know about screenings and tests to prevent falls? Screening and testing are the best ways to find a health problem early. Early diagnosis and treatment give you the best chance of managing medical conditions that are common after age 38. Certain conditions and lifestyle choices may make you more likely to have a fall. Your health care provider may recommend: Regular vision checks. Poor vision and conditions such as cataracts can make you more likely to have a fall. If you wear glasses, make sure to get your prescription updated if your vision changes. Medicine review. Work with your health care provider to regularly review all of the medicines you are taking, including over-the-counter medicines. Ask your health care provider about any side effects that may make you more likely to have a fall. Tell your health care provider if any medicines that you take make you feel dizzy or sleepy. Strength and balance checks. Your health care provider may recommend certain tests to check your strength  and balance while standing, walking, or changing positions. Foot health exam. Foot pain and numbness, as well as not wearing proper footwear, can make you more likely to have a fall. Screenings, including: Osteoporosis screening. Osteoporosis is a condition that causes the bones to get weaker and break more easily. Blood pressure screening. Blood pressure changes and medicines to control blood pressure can make you feel dizzy. Depression screening. You may be more likely to have a fall if you have a fear of falling, feel depressed, or feel unable to do activities that you used to do. Alcohol use screening. Using too much alcohol can affect your balance and may make you more likely to have a fall. Follow these instructions at home: Lifestyle Do not drink alcohol if: Your health care provider tells you not to drink. If you drink alcohol: Limit how much you have to: 0-1 drink a day for women. 0-2 drinks a day for men. Know how much alcohol is in your drink. In the [place] one drink equals one 12 oz bottle of beer (355 mL), one 5 oz glass of wine (148 mL), or one 1 oz glass of hard liquor (44 mL). Do not use any products that contain nicotine or tobacco. These products include cigarettes, chewing tobacco, and vaping devices, such as e-cigarettes. If you need help quitting, ask your health care provider. Activity  Follow a regular exercise program to stay fit. This will help you maintain your balance. Ask your health care provider what types of exercise are appropriate for you. If  you need a cane or walker, use it as recommended by your health care provider. Wear supportive shoes that have nonskid soles. Safety  Remove any tripping hazards, such as rugs, cords, and clutter. Install safety equipment such as grab bars in bathrooms and safety rails on stairs. Keep rooms and walkways well-lit. General instructions Talk with your health care provider about your risks for falling. Tell your health  care provider if: You fall. Be sure to tell your health care provider about all falls, even ones that seem minor. You feel dizzy, tiredness (fatigue), or off-balance. Take over-the-counter and prescription medicines only as told by your health care provider. These include supplements. Eat a healthy diet and maintain a healthy weight. A healthy diet includes low-fat dairy products, low-fat (lean) meats, and fiber from whole grains, beans, and lots of fruits and vegetables. Stay current with your vaccines. Schedule regular health, dental, and eye exams. Summary Having a healthy lifestyle and getting preventive care can help to protect your health and wellness after age 54. Screening and testing are the best way to find a health problem early and help you avoid having a fall. Early diagnosis and treatment give you the best chance for managing medical conditions that are more common for people who are older than age 41. Falls are a major cause of broken bones and head injuries in people who are older than age 52. Take precautions to prevent a fall at home. Work with your health care provider to learn what changes you can make to improve your health and wellness and to prevent falls. This information is not intended to replace advice given to you by your health care provider. Make sure you discuss any questions you have with your health care provider. Document Revised: 01/11/2021 Document Reviewed: 01/11/2021 Elsevier Patient Education  [place].

## 2022-04-29 NOTE — Telephone Encounter (Signed)
Prescription refill request for Eliquis received. Indication:Afib Last office visit:7/23 Scr:1.5 Age: 76 Weight:69.5 kg  Prescription refilled

## 2022-04-29 NOTE — Assessment & Plan Note (Signed)
Reviewed deteriorated kidney function due to nephron mass loss after R nephrectomy. Encouraged good water intake, limiting NSAIDs. Will continue to monitor.

## 2022-04-29 NOTE — Assessment & Plan Note (Signed)
Followed by [facility] oncology and urology Dr Yves Dill s/p nephrectomy 07/2021.

## 2022-04-29 NOTE — Assessment & Plan Note (Signed)
S/p TURP laser surgery x2 (2023)  Continues flomax. Sees urology regularly.

## 2022-04-29 NOTE — Assessment & Plan Note (Addendum)
Not on medication.  The 10-year ASCVD risk score (Arnett DK, et al., 2019) is: 26%   Values used to calculate the score:     Age: 76 years     Sex: Male     Is Non-Hispanic African American: No     Diabetic: No     Tobacco smoker: No     Systolic Blood Pressure: 747 mmHg     Is BP treated: Yes     HDL Cholesterol: 45.9 mg/dL     Total Cholesterol: 161 mg/dL

## 2022-04-29 NOTE — Assessment & Plan Note (Signed)
S/p cardioversion 04/2022. Continues cardizem and eliquis.

## 2022-04-29 NOTE — Assessment & Plan Note (Signed)
Now sees liver clinic at [facility]. Monitoring spot on liver.

## 2022-04-29 NOTE — Assessment & Plan Note (Signed)
Discussed limiting added sugar in diet.

## 2022-04-29 NOTE — Progress Notes (Signed)
Patient ID: Ernest Pac., male    DOB: [birth year], 76 y.o.   MRN: [id number]  This visit was conducted in person.  BP 124/70   Pulse (!) 56   Temp 97.9 F (36.6 C) (Temporal)   Ht 5' 4.5" (1.638 m)   Wt 153 lb 2 oz (69.5 kg)   SpO2 99%   BMI 25.88 kg/m    CC: CPE Subjective:   HPI: Ernest Bell. is a 76 y.o. male presenting on 04/29/2022 for Annual Exam [facility] prt 2. )   Saw health advisor 11/2030 for medicare wellness visit. Note reviewed. Cognitive assessment not done.    No results found.  Flowsheet Row Clinical Support from 11/25/2021 in [facility] [place]  PHQ-2 Total Score 0          11/25/2021   11:57 AM 11/24/2020    8:15 AM 07/31/2019    9:53 AM 07/16/2018    9:45 AM 07/07/2017    1:09 PM  [place] in the past year? 0 0 0 0 No  Number falls in past yr: 0 0 0    Injury with Fall? 0 0 0    Risk for fall due to : Other (Comment) No Fall Risks Medication side effect    Risk for fall due to: Comment neuropathy      Follow up Falls prevention discussed Falls evaluation completed;Falls prevention discussed Falls evaluation completed;Falls prevention discussed     Continues playing tennis - upcoming tournament in [place].   Afib with constrictive pericarditis sees Dr Rayann Heman on cardizem CD daily and PRN afib. S/p cardioversion earlier this month, continues eliquis 9m bid. Previously on flecainide.   Known NASH cirrhosis followed by [facility] liver clinic.  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 Sees urology Dr WYves Dillas well as [facility] onc regularly.   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   Planned upcoming knee replacement.    Preventative: COLONOSCOPY 02/2000 - 2 hyperplastic polyps, IBS ([name]  Cologuard normal 09/2019  Prostate cancer screening - h/o BPH s/p TURP on flomax daily - sees Dr. WEliberto Ivoryurologist  yearly.  Lung cancer screening - not eligible  Flu shot - yearly  COVID vaccine - Moderna x2, booster x1  Pneumovax-23 2012. Prevnar-13 2015 Td 2011, 03/2013  zostavax - 08/2015  shingrix - discussed. To check with pharmacy  Advanced directives: has at home. HCPOA is wife. Will bring me copy.  Seat belt use discussed  Sunscreen use discussed. No changing moles on skin.  Non smoker Alcohol - none  Dentist q6 mo Eye exam yearly - due  Bowel - no constipation Bladder - no incontinence   [place] Occupation: retired, was cEcologistthen school teacher Activity: He enjoys walking 3 mi regularly and plays tennis three times a week.  Diet: good water, fruits/vegetables daily.      Relevant past medical, surgical, family and social history reviewed and updated as indicated. Interim medical history since our last visit reviewed. Allergies and medications reviewed and updated. Outpatient Medications Prior to Visit  Medication Sig Dispense Refill   apixaban (ELIQUIS) 5 MG TABS tablet Take 1 tablet (5 mg total) by mouth 2 (two) times daily. 60 tablet 1   diltiazem (CARDIZEM CD) 120 MG 24 hr capsule Take 2 capsules (240 mg total) by mouth  daily. 180 capsule 1   gabapentin (NEURONTIN) 300 MG capsule Take 300 mg by mouth 3 (three) times daily.     Polyethyl Glycol-Propyl Glycol (SYSTANE OP) Place 1 drop into both eyes 3 (three) times daily as needed (dry eyes).     tamsulosin (FLOMAX) 0.4 MG CAPS capsule Take 0.4 mg by mouth daily.     diltiazem (CARDIZEM) 30 MG tablet Take 30 mg by mouth as needed (for A-fib episodes).     No facility-administered medications prior to visit.     Per HPI unless specifically indicated in ROS section below Review of Systems  Constitutional:  Negative for activity change, appetite change, chills, fatigue, fever and unexpected weight change.  HENT:  Negative for hearing loss.   Eyes:  Negative for visual disturbance.   Respiratory:  Negative for cough, chest tightness, shortness of breath and wheezing.   Cardiovascular:  Negative for chest pain, palpitations and leg swelling.  Gastrointestinal:  Negative for abdominal distention, abdominal pain, blood in stool, constipation, diarrhea, nausea and vomiting.  Genitourinary:  Negative for difficulty urinating and hematuria.  Musculoskeletal:  Negative for arthralgias, myalgias and neck pain.  Skin:  Negative for rash.  Neurological:  Negative for dizziness, seizures, syncope and headaches.  Hematological:  Negative for adenopathy. Bruises/bleeds easily.  Psychiatric/Behavioral:  Negative for dysphoric mood. The patient is not nervous/anxious.     Objective:  BP 124/70   Pulse (!) 56   Temp 97.9 F (36.6 C) (Temporal)   Ht 5' 4.5" (1.638 m)   Wt 153 lb 2 oz (69.5 kg)   SpO2 99%   BMI 25.88 kg/m   Wt Readings from Last 3 Encounters:  04/29/22 153 lb 2 oz (69.5 kg)  04/25/22 157 lb 12.8 oz (71.6 kg)  03/24/22 155 lb 6.4 oz (70.5 kg)      Physical Exam Vitals and nursing note reviewed.  Constitutional:      General: He is not in acute distress.    Appearance: Normal appearance. He is well-developed. He is not ill-appearing.  HENT:     Head: Normocephalic and atraumatic.     Right Ear: Hearing, tympanic membrane, ear canal and external ear normal.     Left Ear: Hearing, tympanic membrane, ear canal and external ear normal.  Eyes:     General: No scleral icterus.    Extraocular Movements: Extraocular movements intact.     Conjunctiva/sclera: Conjunctivae normal.     Pupils: Pupils are equal, round, and reactive to light.  Neck:     Thyroid: No thyroid mass or thyromegaly.  Cardiovascular:     Rate and Rhythm: Normal rate and regular rhythm.     Pulses: Normal pulses.          Radial pulses are 2+ on the right side and 2+ on the left side.     Heart sounds: Normal heart sounds. No murmur heard. Pulmonary:     Effort: Pulmonary effort is  normal. No respiratory distress.     Breath sounds: Normal breath sounds. No wheezing, rhonchi or rales.  Abdominal:     General: Bowel sounds are normal. There is no distension.     Palpations: Abdomen is soft. There is no mass.     Tenderness: There is no abdominal tenderness. There is no guarding or rebound.     Hernia: No hernia is present.  Musculoskeletal:        General: Normal range of motion.     Cervical back: Normal range of motion  and neck supple.     Right lower leg: No edema.     Left lower leg: No edema.  Lymphadenopathy:     Cervical: No cervical adenopathy.  Skin:    General: Skin is warm and dry.     Findings: No rash.  Neurological:     General: No focal deficit present.     Mental Status: He is alert and oriented to person, place, and time.  Psychiatric:        Mood and Affect: Mood normal.        Behavior: Behavior normal.        Thought Content: Thought content normal.        Judgment: Judgment normal.       Results for orders placed or performed in visit on 04/26/22  Protime-INR  Result Value Ref Range   INR 1.4 (H) 0.8 - 1.0 ratio   Prothrombin Time 15.1 (H) 9.6 - 13.1 sec  CBC with Differential/Platelet  Result Value Ref Range   WBC 6.9 4.0 - 10.5 K/uL   RBC 4.45 4.22 - 5.81 Mil/uL   Hemoglobin 14.1 13.0 - 17.0 g/dL   HCT 39.9 39.0 - 52.0 %   MCV 89.7 78.0 - 100.0 fl   MCHC 35.2 30.0 - 36.0 g/dL   RDW 13.2 11.5 - 15.5 %   Platelets 177.0 150.0 - 400.0 K/uL   Neutrophils Relative % 66.4 43.0 - 77.0 %   Lymphocytes Relative 22.2 12.0 - 46.0 %   Monocytes Relative 8.2 3.0 - 12.0 %   Eosinophils Relative 2.6 0.0 - 5.0 %   Basophils Relative 0.6 0.0 - 3.0 %   Neutro Abs 4.6 1.4 - 7.7 K/uL   Lymphs Abs 1.5 0.7 - 4.0 K/uL   Monocytes Absolute 0.6 0.1 - 1.0 K/uL   Eosinophils Absolute 0.2 0.0 - 0.7 K/uL   Basophils Absolute 0.0 0.0 - 0.1 K/uL  PSA  Result Value Ref Range   PSA 0.33 0.10 - 4.00 ng/mL  Hemoglobin A1c  Result Value Ref Range    Hgb A1c MFr Bld 5.8 4.6 - 6.5 %  Comprehensive metabolic panel  Result Value Ref Range   Sodium 140 135 - 145 mEq/L   Potassium 4.4 3.5 - 5.1 mEq/L   Chloride 106 96 - 112 mEq/L   CO2 25 19 - 32 mEq/L   Glucose, Bld 148 (H) 70 - 99 mg/dL   BUN 23 6 - 23 mg/dL   Creatinine, Ser 1.50 0.40 - 1.50 mg/dL   Total Bilirubin 1.8 (H) 0.2 - 1.2 mg/dL   Alkaline Phosphatase 48 39 - 117 U/L   AST 14 0 - 37 U/L   ALT 15 0 - 53 U/L   Total Protein 6.3 6.0 - 8.3 g/dL   Albumin 4.5 3.5 - 5.2 g/dL   GFR 45.12 (L) >60.00 mL/min   Calcium 9.4 8.4 - 10.5 mg/dL  Lipid panel  Result Value Ref Range   Cholesterol 161 0 - 200 mg/dL   Triglycerides 141.0 0.0 - 149.0 mg/dL   HDL 45.90 >39.00 mg/dL   VLDL 28.2 0.0 - 40.0 mg/dL   LDL Cholesterol 87 0 - 99 mg/dL   Total CHOL/HDL Ratio 4    NonHDL 115.13     Assessment & Plan:   Problem List Items Addressed This Visit     Health maintenance examination - Primary (Chronic)    Preventative protocols reviewed and updated unless pt declined. Discussed healthy diet and lifestyle.  Paroxysmal atrial fibrillation ([place])    S/p cardioversion 04/2022. Continues cardizem and eliquis.       Dyslipidemia    Not on medication.  The 10-year ASCVD risk score (Arnett DK, et al., 2019) is: 26%   Values used to calculate the score:     Age: 30 years     Sex: Male     Is Non-Hispanic African American: No     Diabetic: No     Tobacco smoker: No     Systolic Blood Pressure: 677 mmHg     Is BP treated: Yes     HDL Cholesterol: 45.9 mg/dL     Total Cholesterol: 161 mg/dL       Liver cirrhosis secondary to NASH (nonalcoholic steatohepatitis) ([place])    Now sees liver clinic at [facility]. Monitoring spot on liver.       Prediabetes    Discussed limiting added sugar in diet.       Renal cell carcinoma of right kidney [facility])    Followed by [facility] oncology and urology Dr Yves Dill s/p nephrectomy 07/2021.       BPH (benign prostatic hyperplasia)    S/p TURP laser  surgery x2 (2023)  Continues flomax. Sees urology regularly.       Chronic kidney disease, stage 3a ([place])    Reviewed deteriorated kidney function due to nephron mass loss after R nephrectomy. Encouraged good water intake, limiting NSAIDs. Will continue to monitor.         No orders of the defined types were placed in this encounter.  No orders of the defined types were placed in this encounter.   Patient instructions: If interested, check with pharmacy about new 2 shot shingles series (shingrix).  Schedule eye exam as you're due.  Watch added sugars in the diet. We will continue to monitor kidney function.  Good to see you today.  Return as needed or in 1 year for next physical.   Follow up plan: Return in about 1 year (around 04/30/2023) for annual exam, prior fasting for blood work.  Ria Bush, MD

## 2022-05-05 DIAGNOSIS — M1711 Unilateral primary osteoarthritis, right knee: Secondary | ICD-10-CM | POA: Diagnosis not present

## 2022-05-10 ENCOUNTER — Telehealth (HOSPITAL_COMMUNITY): Payer: Self-pay | Admitting: *Deleted

## 2022-05-10 MED ORDER — DILTIAZEM HCL 30 MG PO TABS: ORAL_TABLET | ORAL | 1 refills | Status: DC

## 2022-05-10 NOTE — Telephone Encounter (Signed)
Patient called in stating since Saturday he has noticed he is back in AF according to his heart rates. He is asymptomatic but HRs ranging from 40-170. HR currently 143 Bp 107/81. Pt is currently fight a head cold as well no fever.  Discussed with Roderic Palau NP will call in PRN cardizem to use for elevated rates. He will call back next week with update of his status after over his cold.

## 2022-05-11 DIAGNOSIS — K7460 Unspecified cirrhosis of liver: Secondary | ICD-10-CM | POA: Diagnosis not present

## 2022-05-11 DIAGNOSIS — Z9889 Other specified postprocedural states: Secondary | ICD-10-CM | POA: Diagnosis not present

## 2022-05-11 DIAGNOSIS — Z8679 Personal history of other diseases of the circulatory system: Secondary | ICD-10-CM | POA: Diagnosis not present

## 2022-05-11 DIAGNOSIS — I480 Paroxysmal atrial fibrillation: Secondary | ICD-10-CM | POA: Diagnosis not present

## 2022-05-11 DIAGNOSIS — I4819 Other persistent atrial fibrillation: Secondary | ICD-10-CM | POA: Diagnosis not present

## 2022-05-11 DIAGNOSIS — Z905 Acquired absence of kidney: Secondary | ICD-10-CM | POA: Diagnosis not present

## 2022-05-11 DIAGNOSIS — Z87898 Personal history of other specified conditions: Secondary | ICD-10-CM | POA: Diagnosis not present

## 2022-05-11 DIAGNOSIS — I484 Atypical atrial flutter: Secondary | ICD-10-CM | POA: Diagnosis not present

## 2022-05-12 NOTE — Telephone Encounter (Signed)
Pt updated - seeing [facility] EP having ablation

## 2022-05-17 DIAGNOSIS — I4819 Other persistent atrial fibrillation: Secondary | ICD-10-CM | POA: Diagnosis not present

## 2022-05-17 DIAGNOSIS — Z01818 Encounter for other preprocedural examination: Secondary | ICD-10-CM | POA: Diagnosis not present

## 2022-05-17 DIAGNOSIS — I484 Atypical atrial flutter: Secondary | ICD-10-CM | POA: Diagnosis not present

## 2022-05-18 DIAGNOSIS — I4891 Unspecified atrial fibrillation: Secondary | ICD-10-CM | POA: Diagnosis not present

## 2022-05-18 DIAGNOSIS — I484 Atypical atrial flutter: Secondary | ICD-10-CM | POA: Diagnosis not present

## 2022-05-18 DIAGNOSIS — Z8679 Personal history of other diseases of the circulatory system: Secondary | ICD-10-CM | POA: Diagnosis not present

## 2022-05-18 DIAGNOSIS — K7469 Other cirrhosis of liver: Secondary | ICD-10-CM | POA: Diagnosis not present

## 2022-05-18 DIAGNOSIS — Z01810 Encounter for preprocedural cardiovascular examination: Secondary | ICD-10-CM | POA: Diagnosis not present

## 2022-05-18 DIAGNOSIS — G4730 Sleep apnea, unspecified: Secondary | ICD-10-CM | POA: Diagnosis not present

## 2022-05-18 DIAGNOSIS — Z9889 Other specified postprocedural states: Secondary | ICD-10-CM | POA: Diagnosis not present

## 2022-05-19 DIAGNOSIS — K7460 Unspecified cirrhosis of liver: Secondary | ICD-10-CM | POA: Diagnosis not present

## 2022-05-19 DIAGNOSIS — I480 Paroxysmal atrial fibrillation: Secondary | ICD-10-CM | POA: Diagnosis not present

## 2022-05-19 DIAGNOSIS — R000 Tachycardia, unspecified: Secondary | ICD-10-CM | POA: Diagnosis not present

## 2022-05-19 DIAGNOSIS — N400 Enlarged prostate without lower urinary tract symptoms: Secondary | ICD-10-CM | POA: Diagnosis not present

## 2022-05-19 DIAGNOSIS — I428 Other cardiomyopathies: Secondary | ICD-10-CM | POA: Diagnosis not present

## 2022-05-19 DIAGNOSIS — I471 Supraventricular tachycardia: Secondary | ICD-10-CM | POA: Diagnosis not present

## 2022-05-19 DIAGNOSIS — I484 Atypical atrial flutter: Secondary | ICD-10-CM | POA: Diagnosis not present

## 2022-05-19 DIAGNOSIS — I2510 Atherosclerotic heart disease of native coronary artery without angina pectoris: Secondary | ICD-10-CM | POA: Diagnosis not present

## 2022-05-19 DIAGNOSIS — I4819 Other persistent atrial fibrillation: Secondary | ICD-10-CM | POA: Diagnosis not present

## 2022-05-19 DIAGNOSIS — K7581 Nonalcoholic steatohepatitis (NASH): Secondary | ICD-10-CM | POA: Diagnosis not present

## 2022-05-19 DIAGNOSIS — I43 Cardiomyopathy in diseases classified elsewhere: Secondary | ICD-10-CM | POA: Diagnosis not present

## 2022-05-20 DIAGNOSIS — I428 Other cardiomyopathies: Secondary | ICD-10-CM | POA: Diagnosis not present

## 2022-05-20 DIAGNOSIS — I4819 Other persistent atrial fibrillation: Secondary | ICD-10-CM | POA: Diagnosis not present

## 2022-05-20 DIAGNOSIS — K7581 Nonalcoholic steatohepatitis (NASH): Secondary | ICD-10-CM | POA: Diagnosis not present

## 2022-05-20 DIAGNOSIS — I484 Atypical atrial flutter: Secondary | ICD-10-CM | POA: Diagnosis not present

## 2022-05-20 DIAGNOSIS — I471 Supraventricular tachycardia: Secondary | ICD-10-CM | POA: Diagnosis not present

## 2022-05-20 DIAGNOSIS — N400 Enlarged prostate without lower urinary tract symptoms: Secondary | ICD-10-CM | POA: Diagnosis not present

## 2022-05-20 DIAGNOSIS — I480 Paroxysmal atrial fibrillation: Secondary | ICD-10-CM | POA: Diagnosis not present

## 2022-05-20 DIAGNOSIS — I2510 Atherosclerotic heart disease of native coronary artery without angina pectoris: Secondary | ICD-10-CM | POA: Diagnosis not present

## 2022-05-20 DIAGNOSIS — I4891 Unspecified atrial fibrillation: Secondary | ICD-10-CM | POA: Diagnosis not present

## 2022-05-20 DIAGNOSIS — K7460 Unspecified cirrhosis of liver: Secondary | ICD-10-CM | POA: Diagnosis not present

## 2022-06-13 ENCOUNTER — Ambulatory Visit: Payer: Medicare PPO | Admitting: Family Medicine

## 2022-06-13 ENCOUNTER — Encounter: Payer: Self-pay | Admitting: Family Medicine

## 2022-06-13 VITALS — BP 114/60 | HR 76 | Temp 98.1°F | Ht 64.5 in | Wt 159.0 lb

## 2022-06-13 DIAGNOSIS — I480 Paroxysmal atrial fibrillation: Secondary | ICD-10-CM | POA: Diagnosis not present

## 2022-06-13 DIAGNOSIS — I484 Atypical atrial flutter: Secondary | ICD-10-CM | POA: Diagnosis not present

## 2022-06-13 DIAGNOSIS — I429 Cardiomyopathy, unspecified: Secondary | ICD-10-CM | POA: Diagnosis not present

## 2022-06-13 NOTE — Progress Notes (Signed)
Patient ID: Ernest Pac., male    DOB: 1945/10/18, 76 y.o.   MRN: [id number]  This visit was conducted in person.  BP 114/60 (BP Location: Right Arm, Patient Position: Sitting, Cuff Size: Normal)   Pulse 76   Temp 98.1 F (36.7 C)   Ht 5' 4.5" (1.638 m)   Wt 159 lb (72.1 kg)   SpO2 98%   BMI 26.87 kg/m    CC: 1 mo f/u visit  Subjective:   HPI: Ernest Bell. is a 76 y.o. male presenting on 06/13/2022 for Heart Ablation Follow-up (Procedure at [facility] 05-19-22)   Completed catheter ablation at [facility] by Dr Omelia Blackwater (05/19/2022) for atrial fibrillation and atypical atrial flutter. Has planned f/u 07/13/2022. Will have labwork and repeat cardiac MRI at that time.   Brings BP log - 89-120s/49-78, HR 50-70 Few isolated lows of unclear cause. Last few days BP 120-130/60-70s.   He is now on valsartan 87m daily, Toprol XL 540monce daily and off diltiazem.   Since ablation, he's felt well without dyspnea, palpitations, chest pain, dizziness, syncope. Played 2 sets of tennis last week and felt well. No caffeine.      Relevant past medical, surgical, family and social history reviewed and updated as indicated. Interim medical history since our last visit reviewed. Allergies and medications reviewed and updated. Outpatient Medications Prior to Visit  Medication Sig Dispense Refill   apixaban (ELIQUIS) 5 MG TABS tablet TAKE 1 TABLET(5 MG) BY MOUTH TWICE DAILY 60 tablet 5   furosemide (LASIX) 20 MG tablet Take 1 tablet by mouth daily as needed.     gabapentin (NEURONTIN) 300 MG capsule Take 300 mg by mouth 3 (three) times daily.     metoprolol succinate (TOPROL-XL) 50 MG 24 hr tablet Take 1 tablet by mouth daily.     tamsulosin (FLOMAX) 0.4 MG CAPS capsule Take 0.4 mg by mouth daily.     valsartan (DIOVAN) 40 MG tablet Take 1 tablet by mouth daily at 2 PM.     diltiazem (CARDIZEM CD) 120 MG 24 hr capsule Take 2 capsules (240 mg total) by mouth daily. 180 capsule 1   diltiazem  (CARDIZEM) 30 MG tablet Take 1 tablet every 4 hours AS NEEDED for AFIB heart rate >100 as long as top BP >100. 30 tablet 1   Polyethyl Glycol-Propyl Glycol (SYSTANE OP) Place 1 drop into both eyes 3 (three) times daily as needed (dry eyes).     No facility-administered medications prior to visit.     Per HPI unless specifically indicated in ROS section below Review of Systems  Objective:  BP 114/60 (BP Location: Right Arm, Patient Position: Sitting, Cuff Size: Normal)   Pulse 76   Temp 98.1 F (36.7 C)   Ht 5' 4.5" (1.638 m)   Wt 159 lb (72.1 kg)   SpO2 98%   BMI 26.87 kg/m   Wt Readings from Last 3 Encounters:  06/13/22 159 lb (72.1 kg)  04/29/22 153 lb 2 oz (69.5 kg)  04/25/22 157 lb 12.8 oz (71.6 kg)      Physical Exam Vitals and nursing note reviewed.  Constitutional:      Appearance: Normal appearance. He is not ill-appearing.  Cardiovascular:     Rate and Rhythm: Normal rate. Rhythm irregularly irregular.     Pulses: Normal pulses.     Heart sounds: Murmur heard.  Pulmonary:     Effort: Pulmonary effort is normal. No respiratory distress.  Breath sounds: Normal breath sounds. No wheezing, rhonchi or rales.  Musculoskeletal:     Right lower leg: No edema.     Left lower leg: No edema.  Skin:    General: Skin is warm and dry.     Findings: No rash.  Neurological:     Mental Status: He is alert.  Psychiatric:        Mood and Affect: Mood normal.        Behavior: Behavior normal.       Results for orders placed or performed in visit on 04/26/22  Protime-INR  Result Value Ref Range   INR 1.4 (H) 0.8 - 1.0 ratio   Prothrombin Time 15.1 (H) 9.6 - 13.1 sec  CBC with Differential/Platelet  Result Value Ref Range   WBC 6.9 4.0 - 10.5 K/uL   RBC 4.45 4.22 - 5.81 Mil/uL   Hemoglobin 14.1 13.0 - 17.0 g/dL   HCT 39.9 39.0 - 52.0 %   MCV 89.7 78.0 - 100.0 fl   MCHC 35.2 30.0 - 36.0 g/dL   RDW 13.2 11.5 - 15.5 %   Platelets 177.0 150.0 - 400.0 K/uL    Neutrophils Relative % 66.4 43.0 - 77.0 %   Lymphocytes Relative 22.2 12.0 - 46.0 %   Monocytes Relative 8.2 3.0 - 12.0 %   Eosinophils Relative 2.6 0.0 - 5.0 %   Basophils Relative 0.6 0.0 - 3.0 %   Neutro Abs 4.6 1.4 - 7.7 K/uL   Lymphs Abs 1.5 0.7 - 4.0 K/uL   Monocytes Absolute 0.6 0.1 - 1.0 K/uL   Eosinophils Absolute 0.2 0.0 - 0.7 K/uL   Basophils Absolute 0.0 0.0 - 0.1 K/uL  PSA  Result Value Ref Range   PSA 0.33 0.10 - 4.00 ng/mL  Hemoglobin A1c  Result Value Ref Range   Hgb A1c MFr Bld 5.8 4.6 - 6.5 %  Comprehensive metabolic panel  Result Value Ref Range   Sodium 140 135 - 145 mEq/L   Potassium 4.4 3.5 - 5.1 mEq/L   Chloride 106 96 - 112 mEq/L   CO2 25 19 - 32 mEq/L   Glucose, Bld 148 (H) 70 - 99 mg/dL   BUN 23 6 - 23 mg/dL   Creatinine, Ser 1.50 0.40 - 1.50 mg/dL   Total Bilirubin 1.8 (H) 0.2 - 1.2 mg/dL   Alkaline Phosphatase 48 39 - 117 U/L   AST 14 0 - 37 U/L   ALT 15 0 - 53 U/L   Total Protein 6.3 6.0 - 8.3 g/dL   Albumin 4.5 3.5 - 5.2 g/dL   GFR 45.12 (L) >60.00 mL/min   Calcium 9.4 8.4 - 10.5 mg/dL  Lipid panel  Result Value Ref Range   Cholesterol 161 0 - 200 mg/dL   Triglycerides 141.0 0.0 - 149.0 mg/dL   HDL 45.90 >39.00 mg/dL   VLDL 28.2 0.0 - 40.0 mg/dL   LDL Cholesterol 87 0 - 99 mg/dL   Total CHOL/HDL Ratio 4    NonHDL 115.13    Cardiac MRI:   1. The left ventricle is normal in cavity size. There is mild concentric hypertrophy. Systolic function is  hypokinetic nearly globally with an akinetic apical inferior wall. LVEF = 28% (prior visual 55%. ).   2.  The right ventricle is normal in cavity size with mild RV wall hypertrophy and moderate global  hypokinesis. RVEF is 31%.     3. There is moderate biatrial enlargement.   4. The aortic valve is  trileaflet in morphology. There is no significant aortic valve stenosis or  regurgitation. There is moderate mitral valve regurgitation.   5. Delayed enhancement imaging is abnormal. 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 This was not seen on the scan in 2012.   7. The pericardium is normal in thickness.  There is no pericardial effusion.    Assessment & Plan:   Problem List Items Addressed This Visit     Paroxysmal atrial fibrillation ([place])    S/p cardioversion with subsequent tachyarrhythmias, saw [facility] EP s/p catheter ablation, seems to be doing well after this, continues eliquis and new BP regimen with overall good control of hypertension and heart rate. Continue current regimen, keep [facility] EP f/u planned for next month.       Relevant Medications   valsartan (DIOVAN) 40 MG tablet   metoprolol succinate (TOPROL-XL) 50 MG 24 hr tablet   furosemide (LASIX) 20 MG tablet   Atypical atrial flutter ([facility])    S/p catheter ablation 05/2022 at [place] (Dr Omelia Blackwater)      Relevant Medications   valsartan (DIOVAN) 40 MG tablet   metoprolol succinate (TOPROL-XL) 50 MG 24 hr tablet   furosemide (LASIX) 20 MG tablet   Cardiomyopathy ([place])    Global hypokinesis of right and left ventricles in setting of tachyarrhythmia s/p catheter ablation last month, LVH, mod MVR, has [facility] EP f/u planned next month.       Relevant Medications   valsartan (DIOVAN) 40 MG tablet   metoprolol succinate (TOPROL-XL) 50 MG 24 hr tablet   furosemide (LASIX) 20 MG tablet     No orders of the defined types were placed in this encounter.  No orders of the defined types were placed in this encounter.    Patient Instructions  Good to see you today. You are doing well after ablation.  Will await [place] cardiology evaluation.   Follow up plan: Return if symptoms worsen or fail to improve.  Ria Bush, MD

## 2022-06-13 NOTE — Assessment & Plan Note (Signed)
S/p catheter ablation 05/2022 at [facility] (Dr Omelia Blackwater)

## 2022-06-13 NOTE — Patient Instructions (Signed)
Good to see you today. You are doing well after ablation.  Will await [place] cardiology evaluation.

## 2022-06-13 NOTE — Assessment & Plan Note (Signed)
S/p cardioversion with subsequent tachyarrhythmias, saw [facility] EP s/p catheter ablation, seems to be doing well after this, continues eliquis and new BP regimen with overall good control of hypertension and heart rate. Continue current regimen, keep [facility] EP f/u planned for next month.

## 2022-06-13 NOTE — Assessment & Plan Note (Signed)
Global hypokinesis of right and left ventricles in setting of tachyarrhythmia s/p catheter ablation last month, LVH, mod MVR, has [facility] EP f/u planned next month.

## 2022-07-13 DIAGNOSIS — Z9889 Other specified postprocedural states: Secondary | ICD-10-CM | POA: Diagnosis not present

## 2022-07-13 DIAGNOSIS — I484 Atypical atrial flutter: Secondary | ICD-10-CM | POA: Diagnosis not present

## 2022-07-13 DIAGNOSIS — I4891 Unspecified atrial fibrillation: Secondary | ICD-10-CM | POA: Diagnosis not present

## 2022-07-13 DIAGNOSIS — I10 Essential (primary) hypertension: Secondary | ICD-10-CM | POA: Diagnosis not present

## 2022-07-13 DIAGNOSIS — I480 Paroxysmal atrial fibrillation: Secondary | ICD-10-CM | POA: Diagnosis not present

## 2022-07-13 DIAGNOSIS — I110 Hypertensive heart disease with heart failure: Secondary | ICD-10-CM | POA: Diagnosis not present

## 2022-07-13 DIAGNOSIS — Z8679 Personal history of other diseases of the circulatory system: Secondary | ICD-10-CM | POA: Diagnosis not present

## 2022-07-13 DIAGNOSIS — I5020 Unspecified systolic (congestive) heart failure: Secondary | ICD-10-CM | POA: Diagnosis not present

## 2022-07-25 DIAGNOSIS — K7469 Other cirrhosis of liver: Secondary | ICD-10-CM | POA: Diagnosis not present

## 2022-07-25 DIAGNOSIS — Z85528 Personal history of other malignant neoplasm of kidney: Secondary | ICD-10-CM | POA: Diagnosis not present

## 2022-07-25 DIAGNOSIS — Z08 Encounter for follow-up examination after completed treatment for malignant neoplasm: Secondary | ICD-10-CM | POA: Diagnosis not present

## 2022-07-25 DIAGNOSIS — C641 Malignant neoplasm of right kidney, except renal pelvis: Secondary | ICD-10-CM | POA: Diagnosis not present

## 2022-07-25 DIAGNOSIS — Z905 Acquired absence of kidney: Secondary | ICD-10-CM | POA: Diagnosis not present

## 2022-07-25 DIAGNOSIS — K7460 Unspecified cirrhosis of liver: Secondary | ICD-10-CM | POA: Diagnosis not present

## 2022-08-10 DIAGNOSIS — I480 Paroxysmal atrial fibrillation: Secondary | ICD-10-CM | POA: Diagnosis not present

## 2022-08-10 DIAGNOSIS — Z9889 Other specified postprocedural states: Secondary | ICD-10-CM | POA: Diagnosis not present

## 2022-08-10 DIAGNOSIS — I4891 Unspecified atrial fibrillation: Secondary | ICD-10-CM | POA: Diagnosis not present

## 2022-08-10 DIAGNOSIS — I5020 Unspecified systolic (congestive) heart failure: Secondary | ICD-10-CM | POA: Diagnosis not present

## 2022-08-10 DIAGNOSIS — Z8679 Personal history of other diseases of the circulatory system: Secondary | ICD-10-CM | POA: Diagnosis not present

## 2022-08-10 DIAGNOSIS — R0602 Shortness of breath: Secondary | ICD-10-CM | POA: Diagnosis not present

## 2022-08-10 DIAGNOSIS — Z79899 Other long term (current) drug therapy: Secondary | ICD-10-CM | POA: Diagnosis not present

## 2022-08-10 DIAGNOSIS — R42 Dizziness and giddiness: Secondary | ICD-10-CM | POA: Diagnosis not present

## 2022-08-10 DIAGNOSIS — I10 Essential (primary) hypertension: Secondary | ICD-10-CM | POA: Diagnosis not present

## 2022-08-25 DIAGNOSIS — I4819 Other persistent atrial fibrillation: Secondary | ICD-10-CM | POA: Diagnosis not present

## 2022-08-25 DIAGNOSIS — Z8679 Personal history of other diseases of the circulatory system: Secondary | ICD-10-CM | POA: Diagnosis not present

## 2022-08-25 DIAGNOSIS — Z9889 Other specified postprocedural states: Secondary | ICD-10-CM | POA: Diagnosis not present

## 2022-08-31 DIAGNOSIS — M1711 Unilateral primary osteoarthritis, right knee: Secondary | ICD-10-CM | POA: Diagnosis not present

## 2022-09-06 ENCOUNTER — Encounter: Payer: Self-pay | Admitting: Urology

## 2022-09-06 ENCOUNTER — Ambulatory Visit: Payer: Medicare PPO | Admitting: Urology

## 2022-09-06 VITALS — BP 126/62 | HR 64 | Ht 66.0 in | Wt 163.0 lb

## 2022-09-06 DIAGNOSIS — N529 Male erectile dysfunction, unspecified: Secondary | ICD-10-CM

## 2022-09-06 DIAGNOSIS — N138 Other obstructive and reflux uropathy: Secondary | ICD-10-CM | POA: Diagnosis not present

## 2022-09-06 DIAGNOSIS — C641 Malignant neoplasm of right kidney, except renal pelvis: Secondary | ICD-10-CM

## 2022-09-06 DIAGNOSIS — N401 Enlarged prostate with lower urinary tract symptoms: Secondary | ICD-10-CM

## 2022-09-06 DIAGNOSIS — Z85528 Personal history of other malignant neoplasm of kidney: Secondary | ICD-10-CM | POA: Diagnosis not present

## 2022-09-06 MED ORDER — TADALAFIL 10 MG PO TABS: 10.0000 mg | ORAL_TABLET | Freq: Every day | ORAL | 11 refills | Status: DC | PRN

## 2022-09-06 NOTE — Patient Instructions (Signed)
Your Cialis prescription was sent to [email or web address].  Go to the website and set up a free account.  This is the least expensive place to get Cialis, and most medications.  If you have worsening urinary symptoms or problems, please call the clinic.  Otherwise we will see you on a yearly basis.

## 2022-09-06 NOTE — Progress Notes (Signed)
09/06/22 3:32 PM   Ernest Haggis Jr. 12-May-1946 [id number]  CC: History of RCC, BPH, history of urethral stricture, ED  HPI: 77 year old male with history of 4 cm right renal mass who ultimately underwent a right radical laparoscopic nephrectomy at [facility] in November 2022, and has had no evidence of recurrence thus far.  He follows with urology at [facility] for surveillance.  He also is followed by Dr. Eliberto Ivory for BPH, and sounds like he has a history of a microwave prostate procedure in 2017, as well as a greenlight laser procedure in February 2023 for urinary retention.  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 Eliberto Ivory in July 2023 with Optilume balloon dilation and laser incision of bladder neck contracture.  Most recent PSA was normal at 0.33 from August 2023.  He also has cirrhosis managed at [facility].  He denies any urinary symptoms today, specifically any gross hematuria, weak stream, or incontinence.  He has problems with ED, and previously had improvement on Cialis prior and is interested in resuming that medication.   PMH: Past Medical History:  Diagnosis Date   Actinic keratosis    Atrial fib/flutter, transient 04/28/2010   typical appearing atrial flutter dx 04/28/10 s/p CTI ablation 10/11   Basal cell carcinoma (BCC) of neck 1975   BPH (benign prostatic hyperplasia)    PSA stable, followed by Dr. 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 [name] (like due to dysautonomia)   Urinary retention     Surgical History: Past Surgical History:  Procedure Laterality Date   ATRIAL FIBRILLATION ABLATION  09/15/2010   Cryo at [place], [name] ablation at [facility] 04/28/10   CARDIAC CATHETERIZATION     CARDIOVERSION N/A 04/21/2022   Procedure: CARDIOVERSION;  Surgeon: Skeet Latch, MD;  Location: [facility] ENDOSCOPY;  Service: Cardiovascular;  Laterality: N/A;   CARPAL TUNNEL RELEASE Right 1996   CARPOMETACARPAL JOINT ARTHRODESIS Right 1996   thumb   CATARACT EXTRACTION Bilateral 2017   CHOLECYSTECTOMY  1995   COLONOSCOPY  02/2000   2 hyperplastic polyps, [name]   CYSTOSCOPY WITH DIRECT VISION INTERNAL URETHROTOMY N/A 03/17/2022   Procedure: CYSTOSCOPY WITH DIRECT VISION INTERNAL URETHROTOMY  OPTULUME;  Surgeon: Royston Cowper, MD;  Location: [facility] ORS;  Service: Urology;  Laterality: N/A;   ESOPHAGOGASTRODUODENOSCOPY  02/2000   HH ([place])   GREEN LIGHT LASER TURP (TRANSURETHRAL RESECTION OF PROSTATE N/A 10/07/2021   urinary retention due to enlarged prostate after nephrectomy Royston Cowper, MD)   HEMORROIDECTOMY  2007   KNEE ARTHROSCOPY Right 2016   torn meniscus   KNEE SURGERY  2007   he  had osteoarthritis with left knee surgery   NEPHRECTOMY RADICAL Right 07/19/2021   Renal Mass on Right Kidney, surgery at [place]   at [place]  2017   Family History: Family History  Problem Relation Age of Onset   Hypertension Mother    Alcohol abuse Father        smoker   Heart disease Father        MI (smoker)    Social History:  reports that he has never smoked. He has been exposed to tobacco smoke. He has never used smokeless tobacco. He reports that he does not drink alcohol and  does not use drugs.  Physical Exam: BP 126/62 (BP Location: Left Arm, Patient Position: Sitting, Cuff Size: Normal)   Pulse 64   Ht '5\' 6"'$  (1.676 m)   Wt 163 lb (73.9 kg)   BMI 26.31 kg/m    Constitutional:  Alert and oriented, No acute distress. Cardiovascular: No clubbing, cyanosis, or edema. Respiratory: Normal respiratory effort, no increased work of breathing. GI: Abdomen is soft, nontender, nondistended, no abdominal masses  Assessment & Plan:   77 year old male with history of RCC treated with radical right nephrectomy at [facility] in November 2022, no evidence of recurrence thus far, as well as history of BPH and retention treated with greenlight PVP by Dr. Eliberto Ivory in February 2023.  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 Eliberto Ivory in July 2023.  Denies any urinary symptoms today.  PSA normal at 0.33, and can discontinue screening per guideline recommendations.  Also has ED, previously responsive to Cialis.  -Trial of Cialis 10 to 20 mg as needed for ED -Okay to discontinue Flomax-but can resume if recurrent urinary symptoms -Continue follow-up with [facility] urologic oncology for history of RCC and routine imaging -RTC 1 year PVR and symptom check  Nickolas Madrid, MD 09/06/2022  [place] [address], [place], [place] [address] [phone]

## 2022-09-15 DIAGNOSIS — K7460 Unspecified cirrhosis of liver: Secondary | ICD-10-CM | POA: Diagnosis not present

## 2022-09-15 DIAGNOSIS — R0602 Shortness of breath: Secondary | ICD-10-CM | POA: Diagnosis not present

## 2022-09-15 DIAGNOSIS — R160 Hepatomegaly, not elsewhere classified: Secondary | ICD-10-CM | POA: Diagnosis not present

## 2022-09-15 DIAGNOSIS — Z1159 Encounter for screening for other viral diseases: Secondary | ICD-10-CM | POA: Diagnosis not present

## 2022-09-26 DIAGNOSIS — R202 Paresthesia of skin: Secondary | ICD-10-CM | POA: Diagnosis not present

## 2022-09-26 DIAGNOSIS — R208 Other disturbances of skin sensation: Secondary | ICD-10-CM | POA: Diagnosis not present

## 2022-09-26 DIAGNOSIS — G629 Polyneuropathy, unspecified: Secondary | ICD-10-CM | POA: Diagnosis not present

## 2022-09-26 DIAGNOSIS — M79675 Pain in left toe(s): Secondary | ICD-10-CM | POA: Diagnosis not present

## 2022-09-26 DIAGNOSIS — Z85528 Personal history of other malignant neoplasm of kidney: Secondary | ICD-10-CM | POA: Diagnosis not present

## 2022-10-10 ENCOUNTER — Encounter: Payer: Self-pay | Admitting: Dermatology

## 2022-10-10 ENCOUNTER — Ambulatory Visit: Payer: Medicare PPO | Admitting: Dermatology

## 2022-10-10 VITALS — BP 129/68 | HR 56

## 2022-10-10 DIAGNOSIS — D1801 Hemangioma of skin and subcutaneous tissue: Secondary | ICD-10-CM | POA: Diagnosis not present

## 2022-10-10 DIAGNOSIS — L821 Other seborrheic keratosis: Secondary | ICD-10-CM | POA: Diagnosis not present

## 2022-10-10 DIAGNOSIS — L578 Other skin changes due to chronic exposure to nonionizing radiation: Secondary | ICD-10-CM

## 2022-10-10 DIAGNOSIS — Z1283 Encounter for screening for malignant neoplasm of skin: Secondary | ICD-10-CM

## 2022-10-10 DIAGNOSIS — L570 Actinic keratosis: Secondary | ICD-10-CM | POA: Diagnosis not present

## 2022-10-10 DIAGNOSIS — Z85828 Personal history of other malignant neoplasm of skin: Secondary | ICD-10-CM

## 2022-10-10 DIAGNOSIS — L814 Other melanin hyperpigmentation: Secondary | ICD-10-CM

## 2022-10-10 NOTE — Patient Instructions (Addendum)
Cryotherapy Aftercare  Wash gently with soap and water everyday.   Apply Vaseline and Band-Aid daily until healed.    Recommend starting moisturizer with exfoliant (Urea, Salicylic acid, or Lactic acid) one to two times daily to help smooth rough and bumpy skin.  OTC options include Cetaphil Rough and Bumpy lotion (Urea), Eucerin Roughness Relief lotion or spot treatment cream (Urea), CeraVe SA lotion/cream for Rough and Bumpy skin (Sal Acid), Gold Bond Rough and Bumpy cream (Sal Acid), and AmLactin 12% lotion/cream (Lactic Acid).  If applying in morning, also apply sunscreen to sun-exposed areas, since these exfoliating moisturizers can increase sensitivity to sun.   Due to recent changes in healthcare laws, you may see results of your pathology and/or laboratory studies on MyChart before the doctors have had a chance to review them. We understand that in some cases there may be results that are confusing or concerning to you. Please understand that not all results are received at the same time and often the doctors may need to interpret multiple results in order to provide you with the best plan of care or course of treatment. Therefore, we ask that you please give [place] 2 business days to thoroughly review all your results before contacting the office for clarification. Should we see a critical lab result, you will be contacted sooner.   If You Need Anything After Your Visit  If you have any questions or concerns for your doctor, please call our main line at [phone] and press option 4 to reach your doctor's medical assistant. If no one answers, please leave a voicemail as directed and we will return your call as soon as possible. Messages left after 4 pm will be answered the following business day.   You may also send [place] a message via [place]. We typically respond to MyChart messages within 1-2 business days.  For prescription refills, please ask your pharmacy to contact our office. Our fax  number is [phone].  If you have an urgent issue when the clinic is closed that cannot wait until the next business day, you can page your doctor at the number below.    Please note that while we do our best to be available for urgent issues outside of office hours, we are not available 24/7.   If you have an urgent issue and are unable to reach [place], you may choose to seek medical care at your doctor's office, retail clinic, urgent care center, or emergency room.  If you have a medical emergency, please immediately call 911 or go to the emergency department.  Pager Numbers  - Dr. Nehemiah Massed: [phone]  - Dr. Laurence Ferrari: [phone]  - Dr. Nicole Kindred: [phone]  In the event of inclement weather, please call our main line at [phone] for an update on the status of any delays or closures.  Dermatology Medication Tips: Please keep the boxes that topical medications come in in order to help keep track of the instructions about where and how to use these. Pharmacies typically print the medication instructions only on the boxes and not directly on the medication tubes.   If your medication is too expensive, please contact our office at [phone] option 4 or send [place] a message through [place].   We are unable to tell what your co-pay for medications will be in advance as this is different depending on your insurance coverage. However, we may be able to find a substitute medication at lower cost or fill out paperwork to get insurance to cover  a needed medication.   If a prior authorization is required to get your medication covered by your insurance company, please allow [place] 1-2 business days to complete this process.  Drug prices often vary depending on where the prescription is filled and some pharmacies may offer cheaper prices.  The website www.goodrx.com contains coupons for medications through different pharmacies. The prices here do not account for what the cost may be with help  from insurance (it may be cheaper with your insurance), but the website can give you the price if you did not use any insurance.  - You can print the associated coupon and take it with your prescription to the pharmacy.  - You may also stop by our office during regular business hours and pick up a GoodRx coupon card.  - If you need your prescription sent electronically to a different pharmacy, notify our office through [facility] or by phone at [phone] option 4.     Si Usted Necesita Algo Despus de Su Visita  Tambin puede enviarnos un mensaje a travs de Pharmacist, community. Por lo general respondemos a los mensajes de MyChart en el transcurso de 1 a 2 das hbiles.  Para renovar recetas, por favor pida a su farmacia que se ponga en contacto con nuestra oficina. Harland Dingwall de fax es [place] [phone].  Si tiene un asunto urgente cuando la clnica est cerrada y que no puede esperar hasta el siguiente da hbil, puede llamar/localizar a su doctor(a) al nmero que aparece a continuacin.   Por favor, tenga en cuenta que aunque hacemos todo lo posible para estar disponibles para asuntos urgentes fuera del horario de [place], no estamos disponibles las 24 horas del da, los 7 das de la [place].   Si tiene un problema urgente y no puede comunicarse con nosotros, puede optar por buscar atencin mdica  en el consultorio de su doctor(a), en una clnica privada, en un centro de atencin urgente o en una sala de emergencias.  Si tiene Engineering geologist, por favor llame inmediatamente al 911 o vaya a la sala de emergencias.  Nmeros de bper  - Dr. Nehemiah Massed: [phone]  - Dra. Moye: [phone]  - Dra. Nicole Kindred: [phone]  En caso de inclemencias del [place], por favor llame a [place]ie Kindred principal al [phone] para una actualizacin sobre el [place] de cualquier retraso o cierre.  Consejos para la medicacin en dermatologa: Por favor, guarde las cajas en las que vienen los  medicamentos de uso tpico para ayudarle a seguir las instrucciones sobre dnde y cmo usarlos. Las farmacias generalmente imprimen las instrucciones del medicamento slo en las cajas y no directamente en los tubos del [place].   Si su medicamento es muy caro, por favor, pngase en contacto con Zigmund Daniel llamando al [phone] y presione la opcin 4 o envenos un mensaje a travs de Pharmacist, community.   No podemos decirle cul ser su copago por los medicamentos por adelantado ya que esto es diferente dependiendo de la cobertura de su seguro. Sin embargo, es posible que podamos encontrar un medicamento sustituto a Electrical engineer un formulario para que el seguro cubra el medicamento que se considera necesario.   Si se requiere una autorizacin previa para que su compaa de seguros [place] su medicamento, por favor permtanos de 1 a 2 das hbiles para completar este proceso.  Los precios de los medicamentos varan con frecuencia dependiendo del Environmental consultant de dnde se surte la receta y alguna farmacias pueden ofrecer precios ms baratos.  El sitio web www.goodrx.com tiene cupones para medicamentos de diferentes farmacias. Los precios aqu no tienen en cuenta lo que podra costar con la ayuda del seguro (puede ser ms barato con su seguro), pero el sitio web puede darle el precio si no utiliz ningn seguro.  - Puede imprimir el cupn correspondiente y llevarlo con su receta a la farmacia.  - Tambin puede pasar por nuestra oficina durante el horario de atencin regular y recoger una tarjeta de cupones de GoodRx.  - Si necesita que su receta se enve electrnicamente a una farmacia diferente, informe a nuestra oficina a travs de MyChart de [place] o por telfono llamando al [phone] y presione la opcin 4.  

## 2022-10-10 NOTE — Progress Notes (Signed)
Follow-Up Visit   Subjective  Ernest Bell. is a 77 y.o. male who presents for the following: Upper body skin exam (Hx of BCC neck, hx of AKs) and check scaly spots (Face, 41m irritating).  The patient presents for Upper Body Skin Exam (UBSE) for skin cancer screening and mole check.  The patient has spots, moles and lesions to be evaluated, some may be new or changing and the patient has concerns that these could be cancer.   The following portions of the chart were reviewed this encounter and updated as appropriate:       Review of Systems:  No other skin or systemic complaints except as noted in HPI or Assessment and Plan.  Objective  Well appearing patient in no apparent distress; mood and affect are within normal limits.  All skin waist up examined.  R lower cheek at sideburn beard area x 1 Hyperkeratotic scaly thin papule  L temple x 2 (2) Pink scaly macules    Assessment & Plan  Hypertrophic actinic keratosis R lower cheek at sideburn beard area x 1  Vs ISK  Destruction of lesion - R lower cheek at sideburn beard area x 1  Destruction method: cryotherapy   Informed consent: discussed and consent obtained   Lesion destroyed using liquid nitrogen: Yes   Region frozen until ice ball extended beyond lesion: Yes   Outcome: patient tolerated procedure well with no complications   Post-procedure details: wound care instructions given   Additional details:  Prior to procedure, discussed risks of blister formation, small wound, skin dyspigmentation, or rare scar following cryotherapy. Recommend Vaseline ointment to treated areas while healing.   AK (actinic keratosis) (2) L temple x 2  Destruction of lesion - L temple x 2  Destruction method: cryotherapy   Informed consent: discussed and consent obtained   Lesion destroyed using liquid nitrogen: Yes   Region frozen until ice ball extended beyond lesion: Yes   Outcome: patient tolerated procedure well with no  complications   Post-procedure details: wound care instructions given   Additional details:  Prior to procedure, discussed risks of blister formation, small wound, skin dyspigmentation, or rare scar following cryotherapy. Recommend Vaseline ointment to treated areas while healing.     Lentigines - Scattered tan macules - Due to sun exposure - Benign-appearing, observe - Recommend daily broad spectrum sunscreen SPF 30+ to sun-exposed areas, reapply every 2 hours as needed. - Call for any changes - upper back  Seborrheic Keratoses - Stuck-on, waxy, tan-brown papules and/or plaques  - Benign-appearing - Discussed benign etiology and prognosis. - Observe - Call for any changes - face, back  Hemangiomas - Red papules - Discussed benign nature - Observe - Call for any changes - trunk  Actinic Damage - Chronic condition, secondary to cumulative UV/sun exposure - diffuse scaly erythematous macules with underlying dyspigmentation - Recommend daily broad spectrum sunscreen SPF 30+ to sun-exposed areas, reapply every 2 hours as needed.  - Staying in the shade or wearing long sleeves, sun glasses (UVA+UVB protection) and wide brim hats (4-inch brim around the entire circumference of the hat) are also recommended for sun protection.  - Call for new or changing lesions. - face  Skin cancer screening performed today.   History of Basal Cell Carcinoma of the Skin - No evidence of recurrence today - Recommend regular full body skin exams - Recommend daily broad spectrum sunscreen SPF 30+ to sun-exposed areas, reapply every 2 hours as needed.  -  Call if any new or changing lesions are noted between office visits  - R ant neck  Return in about 2 months (around 12/09/2022) for recheck Hypertrophic AK vs ISK R lower cheek at sideburn, 1 yr TBSE.  I, Othelia Pulling, RMA, am acting as scribe for Brendolyn Patty, MD .  Documentation: I have reviewed the above documentation for accuracy and  completeness, and I agree with the above.  Brendolyn Patty MD

## 2022-10-13 ENCOUNTER — Encounter (HOSPITAL_COMMUNITY): Payer: Self-pay | Admitting: *Deleted

## 2022-10-27 ENCOUNTER — Telehealth: Payer: Self-pay | Admitting: Family Medicine

## 2022-10-27 NOTE — Telephone Encounter (Signed)
Contacted [name] to schedule their annual wellness visit. Appointment made for 11/29/2022.  [place] Direct Dial: [phone]

## 2022-11-29 ENCOUNTER — Ambulatory Visit (INDEPENDENT_AMBULATORY_CARE_PROVIDER_SITE_OTHER): Payer: Medicare PPO

## 2022-11-29 VITALS — Ht 66.0 in | Wt 148.0 lb

## 2022-11-29 DIAGNOSIS — Z0000 Encounter for general adult medical examination without abnormal findings: Secondary | ICD-10-CM

## 2022-11-29 NOTE — Patient Instructions (Signed)
Ernest Bell , Thank you for taking time to come for your Medicare Wellness Visit. I appreciate your ongoing commitment to your health goals. Please review the following plan we discussed and let me know if I can assist you in the future.   These are the goals we discussed:  Goals      Increase physical activity     Starting 07/16/2018, I will continue to exercise for at least 60 minutes daily.      Patient Stated     07/31/2019, I will maintain and continue medications as prescribed.      Patient Stated     11/25/2021 - I will continue to play tennis 3 times a week for 2-3 hours.     Patient Stated     Maintain weight.        This is a list of the screening recommended for you and due dates:  Health Maintenance  Topic Date Due   COVID-19 Vaccine (1) Never done   Zoster (Shingles) Vaccine (1 of 2) Never done   DTaP/Tdap/Td vaccine (3 - Tdap) 03/26/2023   Medicare Annual Wellness Visit  11/29/2023   Pneumonia Vaccine  Completed   Flu Shot  Completed   Hepatitis C Screening: USPSTF Recommendation to screen - Ages 18-79 yo.  Completed   HPV Vaccine  Aged Out   Colon Cancer Screening  Discontinued   Cologuard (Stool DNA test)  Discontinued    Advanced directives: Please bring a copy of your health care power of attorney and living will to the office to be added to your chart at your convenience.   Conditions/risks identified: none  Next appointment: Follow up in one year for your annual wellness visit. 12/04/23 @ 10:00 telephone visit.  Preventive Care 35 Years and Older, Male  Preventive care refers to lifestyle choices and visits with your health care provider that can promote health and wellness. What does preventive care include? A yearly physical exam. This is also called an annual well check. Dental exams once or twice a year. Routine eye exams. Ask your health care provider how often you should have your eyes checked. Personal lifestyle choices, including: Daily care  of your teeth and gums. Regular physical activity. Eating a healthy diet. Avoiding tobacco and drug use. Limiting alcohol use. Practicing safe sex. Taking low doses of aspirin every day. Taking vitamin and mineral supplements as recommended by your health care provider. What happens during an annual well check? The services and screenings done by your health care provider during your annual well check will depend on your age, overall health, lifestyle risk factors, and family history of disease. Counseling  Your health care provider may ask you questions about your: Alcohol use. Tobacco use. Drug use. Emotional well-being. Home and relationship well-being. Sexual activity. Eating habits. History of falls. Memory and ability to understand (cognition). Work and work Statistician. Screening  You may have the following tests or measurements: Height, weight, and BMI. Blood pressure. Lipid and cholesterol levels. These may be checked every 5 years, or more frequently if you are over 60 years old. Skin check. Lung cancer screening. You may have this screening every year starting at age 20 if you have a 30-pack-year history of smoking and currently smoke or have quit within the past 15 years. Fecal occult blood test (FOBT) of the stool. You may have this test every year starting at age 35. Flexible sigmoidoscopy or colonoscopy. You may have a sigmoidoscopy every 5 years or a colonoscopy  every 10 years starting at age 27. Prostate cancer screening. Recommendations will vary depending on your family history and other risks. Hepatitis C blood test. Hepatitis B blood test. Sexually transmitted disease (STD) testing. Diabetes screening. This is done by checking your blood sugar (glucose) after you have not eaten for a while (fasting). You may have this done every 1-3 years. Abdominal aortic aneurysm (AAA) screening. You may need this if you are a current or former smoker. Osteoporosis. You may  be screened starting at age 65 if you are at high risk. Talk with your health care provider about your test results, treatment options, and if necessary, the need for more tests. Vaccines  Your health care provider may recommend certain vaccines, such as: Influenza vaccine. This is recommended every year. Tetanus, diphtheria, and acellular pertussis (Tdap, Td) vaccine. You may need a Td booster every 10 years. Zoster vaccine. You may need this after age 71. Pneumococcal 13-valent conjugate (PCV13) vaccine. One dose is recommended after age 85. Pneumococcal polysaccharide (PPSV23) vaccine. One dose is recommended after age 32. Talk to your health care provider about which screenings and vaccines you need and how often you need them. This information is not intended to replace advice given to you by your health care provider. Make sure you discuss any questions you have with your health care provider. Document Released: 09/18/2015 Document Revised: 05/11/2016 Document Reviewed: 06/23/2015 Elsevier Interactive Patient Education  2017 Ernest Bell Prevention in the Home Falls can cause injuries. They can happen to people of all ages. There are many things you can do to make your home safe and to help prevent falls. What can I do on the outside of my home? Regularly fix the edges of walkways and driveways and fix any cracks. Remove anything that might make you trip as you walk through a door, such as a raised step or threshold. Trim any bushes or trees on the path to your home. Use bright outdoor lighting. Clear any walking paths of anything that might make someone trip, such as rocks or tools. Regularly check to see if handrails are loose or broken. Make sure that both sides of any steps have handrails. Any raised decks and porches should have guardrails on the edges. Have any leaves, snow, or ice cleared regularly. Use sand or salt on walking paths during winter. Clean up any spills in  your garage right away. This includes oil or grease spills. What can I do in the bathroom? Use night lights. Install grab bars by the toilet and in the tub and shower. Do not use towel bars as grab bars. Use non-skid mats or decals in the tub or shower. If you need to sit down in the shower, use a plastic, non-slip stool. Keep the floor dry. Clean up any water that spills on the floor as soon as it happens. Remove soap buildup in the tub or shower regularly. Attach bath mats securely with double-sided non-slip rug tape. Do not have throw rugs and other things on the floor that can make you trip. What can I do in the bedroom? Use night lights. Make sure that you have a light by your bed that is easy to reach. Do not use any sheets or blankets that are too big for your bed. They should not hang down onto the floor. Have a firm chair that has side arms. You can use this for support while you get dressed. Do not have throw rugs and other things on  the floor that can make you trip. What can I do in the kitchen? Clean up any spills right away. Avoid walking on wet floors. Keep items that you use a lot in easy-to-reach places. If you need to reach something above you, use a strong step stool that has a grab bar. Keep electrical cords out of the way. Do not use floor polish or wax that makes floors slippery. If you must use wax, use non-skid floor wax. Do not have throw rugs and other things on the floor that can make you trip. What can I do with my stairs? Do not leave any items on the stairs. Make sure that there are handrails on both sides of the stairs and use them. Fix handrails that are broken or loose. Make sure that handrails are as long as the stairways. Check any carpeting to make sure that it is firmly attached to the stairs. Fix any carpet that is loose or worn. Avoid having throw rugs at the top or bottom of the stairs. If you do have throw rugs, attach them to the floor with carpet  tape. Make sure that you have a light switch at the top of the stairs and the bottom of the stairs. If you do not have them, ask someone to add them for you. What else can I do to help prevent falls? Wear shoes that: Do not have high heels. Have rubber bottoms. Are comfortable and fit you well. Are closed at the toe. Do not wear sandals. If you use a stepladder: Make sure that it is fully opened. Do not climb a closed stepladder. Make sure that both sides of the stepladder are locked into place. Ask someone to hold it for you, if possible. Clearly mark and make sure that you can see: Any grab bars or handrails. First and last steps. Where the edge of each step is. Use tools that help you move around (mobility aids) if they are needed. These include: Canes. Walkers. Scooters. Crutches. Turn on the lights when you go into a dark area. Replace any light bulbs as soon as they burn out. Set up your furniture so you have a clear path. Avoid moving your furniture around. If any of your floors are uneven, fix them. If there are any pets around you, be aware of where they are. Review your medicines with your doctor. Some medicines can make you feel dizzy. This can increase your chance of falling. Ask your doctor what other things that you can do to help prevent falls. This information is not intended to replace advice given to you by your health care provider. Make sure you discuss any questions you have with your health care provider. Document Released: 06/18/2009 Document Revised: 01/28/2016 Document Reviewed: 09/26/2014 Elsevier Interactive Patient Education  2017 [employer].

## 2022-11-29 NOTE — Progress Notes (Signed)
I connected with  [name] on 11/29/22 by a audio enabled telemedicine application and verified that I am speaking with the correct person using two identifiers.  Patient Location: Home  Provider Location: Office/Clinic  I discussed the limitations of evaluation and management by telemedicine. The patient expressed understanding and agreed to proceed.  Subjective:   Fon Rijo. is a 77 y.o. male who presents for Medicare Annual/Subsequent preventive examination.  Review of Systems      Cardiac Risk Factors include: advanced age (>49men, >72 women);dyslipidemia;male gender     Objective:    Today's Vitals   11/29/22 1332  Weight: 148 lb (67.1 kg)  Height: 5\' 6"  (1.676 m)   Body mass index is 23.89 kg/m.     11/29/2022    1:38 PM 04/21/2022    9:11 AM 03/17/2022    7:32 AM 11/25/2021   12:04 PM 10/07/2021    6:28 AM 09/29/2021   11:35 AM 11/24/2020    8:14 AM  Advanced Directives  Does Patient Have a Medical Advance Directive? Yes No No No No No Yes  Type of Paramedic of [place];Living will      [place];Living will  Copy of [place] in Chart? No - copy requested      No - copy requested  Would patient like information on creating a medical advance directive?  No - Patient declined No - Patient declined No - Patient declined No - Patient declined No - Patient declined     Current Medications (verified) Outpatient Encounter Medications as of 11/29/2022  Medication Sig   apixaban (ELIQUIS) 5 MG TABS tablet TAKE 1 TABLET(5 MG) BY MOUTH TWICE DAILY   empagliflozin (JARDIANCE) 10 MG TABS tablet Take 1 tablet by mouth daily.   gabapentin (NEURONTIN) 300 MG capsule Take 300 mg by mouth 3 (three) times daily.   metoprolol succinate (TOPROL-XL) 50 MG 24 hr tablet Take 1 tablet by mouth daily.   rosuvastatin (CRESTOR) 10 MG tablet Take 10 mg by mouth daily.   sacubitril-valsartan (ENTRESTO) 49-51 MG Take  1 tablet by mouth 2 (two) times daily.   tadalafil (CIALIS) 10 MG tablet Take 1-2 tablets (10-20 mg total) by mouth daily as needed for erectile dysfunction.   valsartan (DIOVAN) 40 MG tablet Take 1 tablet by mouth daily at 2 PM. (Patient not taking: Reported on 11/29/2022)   No facility-administered encounter medications on file as of 11/29/2022.    Allergies (verified) Patient has no known allergies.   History: Past Medical History:  Diagnosis Date   Actinic keratosis    Advanced directives, counseling/discussion 08/12/2019   Atrial fib/flutter, transient 04/28/2010   typical appearing atrial flutter dx 04/28/10 s/p CTI ablation 10/11   Basal cell carcinoma (BCC) of neck 1975   R ant neck   BPH (benign prostatic hyperplasia)    PSA stable, followed by Dr. Eliberto Ivory   [facility]cer [facility])    Cataract    Removed   CHF (congestive heart failure) ([place])    Chronic kidney disease    Constrictive pericarditis 1969   unknown cause s/p pericardectomy   Coronary artery disease    Depression    on sertraline   DJD (degenerative joint disease)    Dyslipidemia 03/03/2014   Gout    Hx of kidney removal    right kidney was removed due to cancer   Liver cirrhosis secondary to NASH (nonalcoholic steatohepatitis) ([place]) 2001   ?by liver  biopsy   Liver lesion    Osteoarthritis of right knee    Pericarditis, constrictive    Persistent atrial fibrillation ([place]) 09/15/2010   S/P PVI ([place]) at [facility]   Pneumonia    Renal cell carcinoma ([place])    Syncope 1997   recurrent S/P tilt study by Dr. [name] (like due to dysautonomia)   Urinary retention    Past Surgical History:  Procedure Laterality Date   ATRIAL FIBRILLATION ABLATION  09/15/2010   [place] at [place], [name] ablation at [facility] 04/28/10   [place] N/A 04/21/2022   Procedure: CARDIOVERSION;  Surgeon: Skeet Latch, MD;  Location: [facility] ENDOSCOPY;  Service: Cardiovascular;  Laterality: N/A;   CARPAL TUNNEL RELEASE  Right 1996   CARPOMETACARPAL JOINT ARTHRODESIS Right 1996   thumb   CATARACT EXTRACTION Bilateral 2017   CHOLECYSTECTOMY  1995   COLONOSCOPY  02/2000   2 hyperplastic polyps, [name]   CYSTOSCOPY WITH DIRECT VISION INTERNAL URETHROTOMY N/A 03/17/2022   Procedure: CYSTOSCOPY WITH DIRECT VISION INTERNAL URETHROTOMY  OPTULUME;  Surgeon: Royston Cowper, MD;  Location: [facility] ORS;  Service: Urology;  Laterality: N/A;   ESOPHAGOGASTRODUODENOSCOPY  02/2000   HH ([place])   GREEN LIGHT LASER TURP (TRANSURETHRAL RESECTION OF PROSTATE N/A 10/07/2021   urinary retention due to enlarged prostate after nephrectomy Royston Cowper, MD)   HEMORROIDECTOMY  2007   KNEE ARTHROSCOPY Right 2016   torn meniscus   KNEE SURGERY  2007   he had osteoarthritis with left knee surgery   NEPHRECTOMY RADICAL Right 07/19/2021   Renal Mass on Right Kidney, surgery at [place]   at [place]  2017   Family History  Problem Relation Age of Onset   Hypertension Mother    Alcohol abuse Father        smoker   Heart disease Father        MI (smoker)   Social History   Socioeconomic History   Marital status: Married    Spouse name: Not on file   Number of children: Not on file   Years of education: Not on file   Highest education level: Not on file  Occupational History   Occupation: Disabled Games developer: UNEMPLOYED    Comment: previously worked as a Garment/textile technologist minor  Tobacco Use   Smoking status: Never    Passive exposure: Past   Smokeless tobacco: Never  Vaping Use   Vaping Use: Never used  Substance and Sexual Activity   Alcohol use: No   Drug use: No   Sexual activity: Yes    Birth control/protection: None  Other Topics Concern   Not on file  Social History Narrative      Occupation: retired, 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   Diet: good water, fruits/vegetables daily   Social Determinants of Health   Financial Resource Strain: Low Risk  (11/29/2022)   Overall Financial Resource Strain (CARDIA)    Difficulty of Paying Living Expenses: Not hard at all  Food Insecurity: No Food Insecurity (11/29/2022)   Hunger Vital Sign    Worried About Running Out of Food in the Last Year: Never true    Ran Out of Food in the Last Year: Never true  Transportation Needs: No Transportation Needs (11/29/2022)   PRAPARE - Transportation  Lack of Transportation (Medical): No    Lack of Transportation (Non-Medical): No  Physical Activity: Sufficiently Active (11/29/2022)   Exercise Vital Sign    Days of Exercise per Week: 7 days    Minutes of Exercise per Session: 120 min  Stress: No Stress Concern Present (11/29/2022)   [place]    Feeling of Stress : Not at all  Social Connections: [place] (11/29/2022)   Social Connection and Isolation Panel [NHANES]    Frequency of Communication with Friends and Family: More than three times a week    Frequency of Social Gatherings with Friends and Family: More than three times a week    Attends Religious Services: More than 4 times per year    Active Member of [employer] or Organizations: Yes    Attends Music therapist: More than 4 times per year    Marital Status: Married    Tobacco Counseling Counseling given: Not Answered   Clinical Intake:  Pre-visit preparation completed: Yes  Pain : No/denies pain     Nutritional Risks: None Diabetes: No  How often do you need to have someone help you when you read instructions, pamphlets, or other written materials from your doctor or pharmacy?: 1 - Never  Diabetic? no  Interpreter Needed?: No  Information entered by :: C.Trinidee Schrag LPN   Activities of Daily Living    11/29/2022    1:39 PM 03/10/2022    4:48 PM  In your present state of  health, do you have any difficulty performing the following activities:  Hearing? 0   Vision? 0   Difficulty concentrating or making decisions? 0   Walking or climbing stairs? 0   Dressing or bathing? 0   Doing errands, shopping? 0 0  Preparing Food and eating ? N   Using the Toilet? N   In the past six months, have you accidently leaked urine? N   Do you have problems with loss of bowel control? N   Managing your Medications? N   Managing your Finances? N   Housekeeping or managing your Housekeeping? N     Patient Care Team: Ria Bush, MD as PCP - General (Family Medicine) [name], MD (Inactive) as Consulting Physician (Cardiology) Royston Cowper, MD as Consulting Physician (Urology) Janalyn Harder, MD (Urology)  Indicate any recent Medical Services you may have received from other than [facility] providers in the past year (date may be approximate).     Assessment:   This is a routine wellness examination for Dusty.  Hearing/Vision screen Hearing Screening - Comments:: No aids Vision Screening - Comments:: No glasses  Dietary issues and exercise activities discussed: Current Exercise Habits: Home exercise routine, Type of exercise: walking (Plays tennis), Time (Minutes): > 60, Frequency (Times/Week): 7, Weekly Exercise (Minutes/Week): 0, Intensity: Mild, Exercise limited by: None identified   Goals Addressed             This Visit's Progress    Patient Stated       Maintain weight.       Depression Screen    11/29/2022    1:37 PM 11/25/2021   12:01 PM 11/24/2020    8:15 AM 07/31/2019    9:54 AM 07/16/2018    9:45 AM 07/07/2017    1:09 PM 04/15/2016   10:39 AM  PHQ 2/9 Scores  PHQ - 2 Score 0 0 0 0 0 0 0  PHQ- 9 Score   0 0  0 0     Fall Risk    11/29/2022    1:33 PM 11/25/2021   11:57 AM 11/24/2020    8:15 AM 07/31/2019    9:53 AM 07/16/2018    9:45 AM  Fall Risk   Falls in the past year? 0 0 0 0 0  Number falls in past yr: 0 0 0 0   Injury  with Fall? 0 0 0 0   Risk for fall due to : No Fall Risks Other (Comment) No Fall Risks Medication side effect   Risk for fall due to: Comment  neuropathy     Follow up Falls prevention discussed;Falls evaluation completed Falls prevention discussed Falls evaluation completed;Falls prevention discussed Falls evaluation completed;Falls prevention discussed     FALL RISK PREVENTION PERTAINING TO THE HOME:  Any stairs in or around the home? No  If so, are there any without handrails? No  Home free of loose throw rugs in walkways, pet beds, electrical cords, etc? Yes  Adequate lighting in your home to reduce risk of falls? Yes   ASSISTIVE DEVICES UTILIZED TO PREVENT FALLS:  Life alert? No  Use of a cane, walker or w/c? No  Grab bars in the bathroom? Yes  Shower chair or bench in shower? Yes  Elevated toilet seat or a handicapped toilet? No    Cognitive Function:    11/24/2020    8:17 AM 07/31/2019    9:55 AM 07/16/2018    9:47 AM 07/07/2017    1:09 PM 04/15/2016   10:37 AM  MMSE - Mini Mental State Exam  Orientation to time 5 5 5 5 5   Orientation to Place 5 5 5 5 5   Registration 3 3 3 3 3   Attention/ Calculation 5 5 0 0 0  Recall 3 3 3 3 3   Language- name 2 objects   0 0 0  Language- repeat 1 1 1 1 1   Language- follow 3 step command   3 3 3   Language- read & follow direction   0 0 0  Write a sentence   0 0 0  Copy design   0 0 0  Total score   20 20 20         11/29/2022    1:40 PM  6CIT Screen  What Year? 0 points  What month? 0 points  What time? 0 points  Count back from 20 0 points  Months in reverse 0 points  Repeat phrase 2 points  Total Score 2 points    Immunizations Immunization History  Administered Date(s) Administered   Fluad Quad(high Dose 65+) 08/12/2019, 07/20/2021   Influenza Whole 05/07/2011   Influenza, Seasonal, Injecte, Preservative Fre 08/18/2015   Influenza,inj,Quad PF,6+ Mos 06/27/2016, 08/21/2018   Influenza-Unspecified 06/23/2017,  06/05/2020, 06/05/2022   Pneumococcal Conjugate-13 03/03/2014   Pneumococcal Polysaccharide-23 07/26/2011   Td 01/26/2010, 03/25/2013   Zoster, Live 08/18/2015    TDAP status: Up to date  Flu Vaccine status: Up to date  Pneumococcal vaccine status: Up to date  Covid-19 vaccine status: Information provided on how to obtain vaccines.   Qualifies for Shingles Vaccine? Yes   Zostavax completed  unknown   Shingrix Completed?: No.    Education has been provided regarding the importance of this vaccine. Patient has been advised to call insurance company to determine out of pocket expense if they have not yet received this vaccine. Advised may also receive vaccine at local pharmacy or Health Dept. Verbalized acceptance and understanding.  Screening  Tests Health Maintenance  Topic Date Due   COVID-19 Vaccine (1) Never done   Zoster Vaccines- Shingrix (1 of 2) Never done   DTaP/Tdap/Td (3 - Tdap) 03/26/2023   Medicare Annual Wellness (AWV)  11/29/2023   Pneumonia Vaccine 17+ Years old  Completed   INFLUENZA VACCINE  Completed   Hepatitis C Screening  Completed   HPV VACCINES  Aged Out   COLONOSCOPY (Pts 45-70yrs Insurance coverage will need to be confirmed)  Discontinued   Fecal DNA (Cologuard)  Discontinued    Health Maintenance  Health Maintenance Due  Topic Date Due   COVID-19 Vaccine (1) Never done   Zoster Vaccines- Shingrix (1 of 2) Never done    Colorectal cancer screening: No longer required.   Lung Cancer Screening: (Low Dose CT Chest recommended if Age 80-80 years, 30 pack-year currently smoking OR have quit w/in 15years.) does not qualify.   Lung Cancer Screening Referral: no  Additional Screening:  Hepatitis C Screening: does qualify; Completed 04/15/16  Vision Screening: Recommended annual ophthalmology exams for early detection of glaucoma and other disorders of the eye. Is the patient up to date with their annual eye exam?  No  Who is the provider or what is  the name of the office in which the patient attends annual eye exams? [facility] If pt is not established with a provider, would they like to be referred to a provider to establish care? No .   Dental Screening: Recommended annual dental exams for proper oral hygiene  Community Resource Referral / Chronic Care Management: CRR required this visit?  No   CCM required this visit?  No      Plan:     I have personally reviewed and noted the following in the patient's chart:   Medical and social history Use of alcohol, tobacco or illicit drugs  Current medications and supplements including opioid prescriptions. Patient is not currently taking opioid prescriptions. Functional ability and status Nutritional status Physical activity Advanced directives List of other physicians Hospitalizations, surgeries, and ER visits in previous 12 months Vitals Screenings to include cognitive, depression, and falls Referrals and appointments  In addition, I have reviewed and discussed with patient certain preventive protocols, quality metrics, and best practice recommendations. A written personalized care plan for preventive services as well as general preventive health recommendations were provided to patient.     Lebron Conners, LPN   [id number]   Nurse Notes: none

## 2022-12-19 ENCOUNTER — Ambulatory Visit: Payer: Medicare PPO | Admitting: Dermatology

## 2022-12-26 DIAGNOSIS — I088 Other rheumatic multiple valve diseases: Secondary | ICD-10-CM | POA: Diagnosis not present

## 2022-12-26 DIAGNOSIS — I5020 Unspecified systolic (congestive) heart failure: Secondary | ICD-10-CM | POA: Diagnosis not present

## 2022-12-28 DIAGNOSIS — Z9889 Other specified postprocedural states: Secondary | ICD-10-CM | POA: Diagnosis not present

## 2022-12-28 DIAGNOSIS — I509 Heart failure, unspecified: Secondary | ICD-10-CM | POA: Diagnosis not present

## 2022-12-28 DIAGNOSIS — I480 Paroxysmal atrial fibrillation: Secondary | ICD-10-CM | POA: Diagnosis not present

## 2022-12-28 DIAGNOSIS — I110 Hypertensive heart disease with heart failure: Secondary | ICD-10-CM | POA: Diagnosis not present

## 2022-12-28 DIAGNOSIS — I5020 Unspecified systolic (congestive) heart failure: Secondary | ICD-10-CM | POA: Diagnosis not present

## 2022-12-28 DIAGNOSIS — I5022 Chronic systolic (congestive) heart failure: Secondary | ICD-10-CM | POA: Diagnosis not present

## 2022-12-28 DIAGNOSIS — I428 Other cardiomyopathies: Secondary | ICD-10-CM | POA: Diagnosis not present

## 2022-12-28 DIAGNOSIS — Z8679 Personal history of other diseases of the circulatory system: Secondary | ICD-10-CM | POA: Diagnosis not present

## 2022-12-28 DIAGNOSIS — I10 Essential (primary) hypertension: Secondary | ICD-10-CM | POA: Diagnosis not present

## 2023-01-05 DIAGNOSIS — I5022 Chronic systolic (congestive) heart failure: Secondary | ICD-10-CM | POA: Diagnosis not present

## 2023-01-25 DIAGNOSIS — K7460 Unspecified cirrhosis of liver: Secondary | ICD-10-CM | POA: Diagnosis not present

## 2023-01-25 DIAGNOSIS — Z85528 Personal history of other malignant neoplasm of kidney: Secondary | ICD-10-CM | POA: Diagnosis not present

## 2023-01-25 DIAGNOSIS — C641 Malignant neoplasm of right kidney, except renal pelvis: Secondary | ICD-10-CM | POA: Diagnosis not present

## 2023-01-25 DIAGNOSIS — Z08 Encounter for follow-up examination after completed treatment for malignant neoplasm: Secondary | ICD-10-CM | POA: Diagnosis not present

## 2023-02-14 ENCOUNTER — Ambulatory Visit: Payer: Medicare PPO | Admitting: Dermatology

## 2023-03-02 DIAGNOSIS — I440 Atrioventricular block, first degree: Secondary | ICD-10-CM | POA: Diagnosis not present

## 2023-03-02 DIAGNOSIS — R001 Bradycardia, unspecified: Secondary | ICD-10-CM | POA: Diagnosis not present

## 2023-03-02 DIAGNOSIS — I4819 Other persistent atrial fibrillation: Secondary | ICD-10-CM | POA: Diagnosis not present

## 2023-03-13 DIAGNOSIS — I4819 Other persistent atrial fibrillation: Secondary | ICD-10-CM | POA: Diagnosis not present

## 2023-03-13 DIAGNOSIS — K769 Liver disease, unspecified: Secondary | ICD-10-CM | POA: Diagnosis not present

## 2023-03-13 DIAGNOSIS — K7689 Other specified diseases of liver: Secondary | ICD-10-CM | POA: Diagnosis not present

## 2023-03-13 DIAGNOSIS — K7460 Unspecified cirrhosis of liver: Secondary | ICD-10-CM | POA: Diagnosis not present

## 2023-03-15 DIAGNOSIS — M170 Bilateral primary osteoarthritis of knee: Secondary | ICD-10-CM | POA: Diagnosis not present

## 2023-03-22 DIAGNOSIS — I4819 Other persistent atrial fibrillation: Secondary | ICD-10-CM | POA: Diagnosis not present

## 2023-04-05 ENCOUNTER — Encounter (INDEPENDENT_AMBULATORY_CARE_PROVIDER_SITE_OTHER): Payer: Self-pay

## 2023-04-05 DIAGNOSIS — Z9889 Other specified postprocedural states: Secondary | ICD-10-CM | POA: Diagnosis not present

## 2023-04-05 DIAGNOSIS — I259 Chronic ischemic heart disease, unspecified: Secondary | ICD-10-CM | POA: Diagnosis not present

## 2023-04-05 DIAGNOSIS — Z8679 Personal history of other diseases of the circulatory system: Secondary | ICD-10-CM | POA: Diagnosis not present

## 2023-04-05 DIAGNOSIS — I5022 Chronic systolic (congestive) heart failure: Secondary | ICD-10-CM | POA: Diagnosis not present

## 2023-04-05 DIAGNOSIS — I951 Orthostatic hypotension: Secondary | ICD-10-CM | POA: Diagnosis not present

## 2023-04-05 DIAGNOSIS — I480 Paroxysmal atrial fibrillation: Secondary | ICD-10-CM | POA: Diagnosis not present

## 2023-04-05 DIAGNOSIS — I509 Heart failure, unspecified: Secondary | ICD-10-CM | POA: Diagnosis not present

## 2023-04-05 DIAGNOSIS — I5020 Unspecified systolic (congestive) heart failure: Secondary | ICD-10-CM | POA: Diagnosis not present

## 2023-04-05 DIAGNOSIS — I110 Hypertensive heart disease with heart failure: Secondary | ICD-10-CM | POA: Diagnosis not present

## 2023-04-22 ENCOUNTER — Other Ambulatory Visit: Payer: Self-pay | Admitting: Family Medicine

## 2023-04-22 DIAGNOSIS — I480 Paroxysmal atrial fibrillation: Secondary | ICD-10-CM

## 2023-04-22 DIAGNOSIS — N1831 Chronic kidney disease, stage 3a: Secondary | ICD-10-CM

## 2023-04-22 DIAGNOSIS — K7460 Unspecified cirrhosis of liver: Secondary | ICD-10-CM

## 2023-04-22 DIAGNOSIS — R7303 Prediabetes: Secondary | ICD-10-CM

## 2023-04-22 DIAGNOSIS — E785 Hyperlipidemia, unspecified: Secondary | ICD-10-CM

## 2023-04-25 ENCOUNTER — Other Ambulatory Visit (INDEPENDENT_AMBULATORY_CARE_PROVIDER_SITE_OTHER): Payer: Medicare PPO

## 2023-04-25 DIAGNOSIS — E785 Hyperlipidemia, unspecified: Secondary | ICD-10-CM | POA: Diagnosis not present

## 2023-04-25 DIAGNOSIS — K7460 Unspecified cirrhosis of liver: Secondary | ICD-10-CM | POA: Diagnosis not present

## 2023-04-25 DIAGNOSIS — K7581 Nonalcoholic steatohepatitis (NASH): Secondary | ICD-10-CM | POA: Diagnosis not present

## 2023-04-25 DIAGNOSIS — N1831 Chronic kidney disease, stage 3a: Secondary | ICD-10-CM

## 2023-04-25 DIAGNOSIS — R7303 Prediabetes: Secondary | ICD-10-CM | POA: Diagnosis not present

## 2023-04-25 LAB — LIPID PANEL
Cholesterol: 141 mg/dL (ref 0–200)
HDL: 40.6 mg/dL (ref >39.00)
LDL Cholesterol: 73 mg/dL (ref 0–99)
NonHDL: 100.68
Total CHOL/HDL Ratio: 3
Triglycerides: 137 mg/dL (ref 0.0–149.0)
VLDL: 27.4 mg/dL (ref 0.0–40.0)

## 2023-04-25 LAB — COMPREHENSIVE METABOLIC PANEL
ALT: 21 U/L (ref 0–53)
AST: 22 U/L (ref 0–37)
Albumin: 4.4 g/dL (ref 3.5–5.2)
Alkaline Phosphatase: 43 U/L (ref 39–117)
BUN: 28 mg/dL — ABNORMAL HIGH (ref 6–23)
CO2: 24 mEq/L (ref 19–32)
Calcium: 8.8 mg/dL (ref 8.4–10.5)
Chloride: 105 mEq/L (ref 96–112)
Creatinine, Ser: 1.42 mg/dL (ref 0.40–1.50)
GFR: 47.85 mL/min — ABNORMAL LOW (ref >60.00)
Glucose, Bld: 111 mg/dL — ABNORMAL HIGH (ref 70–99)
Potassium: 4.8 mEq/L (ref 3.5–5.1)
Sodium: 136 mEq/L (ref 135–145)
Total Bilirubin: 1.8 mg/dL — ABNORMAL HIGH (ref 0.2–1.2)
Total Protein: 6.4 g/dL (ref 6.0–8.3)

## 2023-04-25 LAB — VITAMIN D 25 HYDROXY (VIT D DEFICIENCY, FRACTURES): VITD: 42.67 ng/mL (ref 30.00–100.00)

## 2023-04-25 LAB — MICROALBUMIN / CREATININE URINE RATIO
Creatinine,U: 84.6 mg/dL
Microalb Creat Ratio: 1.1 mg/g (ref 0.0–30.0)
Microalb, Ur: 0.9 mg/dL (ref 0.0–1.9)

## 2023-04-25 LAB — HEMOGLOBIN A1C: Hgb A1c MFr Bld: 5.7 % (ref 4.6–6.5)

## 2023-04-26 DIAGNOSIS — M9901 Segmental and somatic dysfunction of cervical region: Secondary | ICD-10-CM | POA: Diagnosis not present

## 2023-04-26 DIAGNOSIS — M9903 Segmental and somatic dysfunction of lumbar region: Secondary | ICD-10-CM | POA: Diagnosis not present

## 2023-04-26 DIAGNOSIS — M9902 Segmental and somatic dysfunction of thoracic region: Secondary | ICD-10-CM | POA: Diagnosis not present

## 2023-04-26 DIAGNOSIS — M545 Low back pain, unspecified: Secondary | ICD-10-CM | POA: Diagnosis not present

## 2023-04-26 LAB — PARATHYROID HORMONE, INTACT (NO CA): PTH: 57 pg/mL (ref 16–77)

## 2023-04-28 DIAGNOSIS — M9901 Segmental and somatic dysfunction of cervical region: Secondary | ICD-10-CM | POA: Diagnosis not present

## 2023-04-28 DIAGNOSIS — M9903 Segmental and somatic dysfunction of lumbar region: Secondary | ICD-10-CM | POA: Diagnosis not present

## 2023-04-28 DIAGNOSIS — M545 Low back pain, unspecified: Secondary | ICD-10-CM | POA: Diagnosis not present

## 2023-04-28 DIAGNOSIS — M9902 Segmental and somatic dysfunction of thoracic region: Secondary | ICD-10-CM | POA: Diagnosis not present

## 2023-05-01 DIAGNOSIS — M545 Low back pain, unspecified: Secondary | ICD-10-CM | POA: Diagnosis not present

## 2023-05-01 DIAGNOSIS — M9902 Segmental and somatic dysfunction of thoracic region: Secondary | ICD-10-CM | POA: Diagnosis not present

## 2023-05-01 DIAGNOSIS — M9903 Segmental and somatic dysfunction of lumbar region: Secondary | ICD-10-CM | POA: Diagnosis not present

## 2023-05-01 DIAGNOSIS — M9901 Segmental and somatic dysfunction of cervical region: Secondary | ICD-10-CM | POA: Diagnosis not present

## 2023-05-02 ENCOUNTER — Encounter: Payer: Medicare PPO | Admitting: Family Medicine

## 2023-05-05 DIAGNOSIS — M9901 Segmental and somatic dysfunction of cervical region: Secondary | ICD-10-CM | POA: Diagnosis not present

## 2023-05-05 DIAGNOSIS — M9903 Segmental and somatic dysfunction of lumbar region: Secondary | ICD-10-CM | POA: Diagnosis not present

## 2023-05-05 DIAGNOSIS — M9902 Segmental and somatic dysfunction of thoracic region: Secondary | ICD-10-CM | POA: Diagnosis not present

## 2023-05-05 DIAGNOSIS — M545 Low back pain, unspecified: Secondary | ICD-10-CM | POA: Diagnosis not present

## 2023-05-17 ENCOUNTER — Encounter: Payer: Self-pay | Admitting: Family Medicine

## 2023-05-17 ENCOUNTER — Ambulatory Visit (INDEPENDENT_AMBULATORY_CARE_PROVIDER_SITE_OTHER): Payer: Medicare PPO | Admitting: Family Medicine

## 2023-05-17 VITALS — BP 132/64 | HR 60 | Temp 97.8°F | Ht 64.5 in | Wt 154.2 lb

## 2023-05-17 DIAGNOSIS — G629 Polyneuropathy, unspecified: Secondary | ICD-10-CM | POA: Diagnosis not present

## 2023-05-17 DIAGNOSIS — I480 Paroxysmal atrial fibrillation: Secondary | ICD-10-CM

## 2023-05-17 DIAGNOSIS — Z0000 Encounter for general adult medical examination without abnormal findings: Secondary | ICD-10-CM | POA: Diagnosis not present

## 2023-05-17 DIAGNOSIS — Z23 Encounter for immunization: Secondary | ICD-10-CM | POA: Diagnosis not present

## 2023-05-17 DIAGNOSIS — Z7189 Other specified counseling: Secondary | ICD-10-CM

## 2023-05-17 DIAGNOSIS — M170 Bilateral primary osteoarthritis of knee: Secondary | ICD-10-CM

## 2023-05-17 DIAGNOSIS — N1831 Chronic kidney disease, stage 3a: Secondary | ICD-10-CM

## 2023-05-17 DIAGNOSIS — Z1211 Encounter for screening for malignant neoplasm of colon: Secondary | ICD-10-CM

## 2023-05-17 DIAGNOSIS — E785 Hyperlipidemia, unspecified: Secondary | ICD-10-CM

## 2023-05-17 DIAGNOSIS — K7581 Nonalcoholic steatohepatitis (NASH): Secondary | ICD-10-CM

## 2023-05-17 DIAGNOSIS — I484 Atypical atrial flutter: Secondary | ICD-10-CM

## 2023-05-17 DIAGNOSIS — R7303 Prediabetes: Secondary | ICD-10-CM

## 2023-05-17 DIAGNOSIS — N400 Enlarged prostate without lower urinary tract symptoms: Secondary | ICD-10-CM

## 2023-05-17 DIAGNOSIS — C641 Malignant neoplasm of right kidney, except renal pelvis: Secondary | ICD-10-CM

## 2023-05-17 DIAGNOSIS — I429 Cardiomyopathy, unspecified: Secondary | ICD-10-CM

## 2023-05-17 DIAGNOSIS — K7460 Unspecified cirrhosis of liver: Secondary | ICD-10-CM

## 2023-05-17 NOTE — Assessment & Plan Note (Addendum)
R>L knees, s/p steroid injection over summer, followed by ortho and discussing R knee replacement 09/2023.

## 2023-05-17 NOTE — Assessment & Plan Note (Signed)
Chronic, present for several years, followed by neurology now on gabapentin 300/900mg  daily.

## 2023-05-17 NOTE — Assessment & Plan Note (Signed)
Chronic, good control on crestor. The 10-year ASCVD risk score (Arnett DK, et al., 2019) is: 32.3%   Values used to calculate the score:     Age: 77 years     Sex: Male     Is Non-Hispanic African American: No     Diabetic: No     Tobacco smoker: No     Systolic Blood Pressure: 132 mmHg     Is BP treated: Yes     HDL Cholesterol: 40.6 mg/dL     Total Cholesterol: 141 mg/dL

## 2023-05-17 NOTE — Addendum Note (Signed)
Addended by: Eustaquio Boyden on: 05/17/2023 01:29 PM   Modules accepted: Level of Service

## 2023-05-17 NOTE — Assessment & Plan Note (Signed)
Advanced directives: has this at home. HCPOA is wife. Full code, but would not want prolonged life support if terminal condition. Asked to bring me copy.

## 2023-05-17 NOTE — Assessment & Plan Note (Signed)
Regularly sees [facility], now on entresto, toprol XL, jardiance and spironolactone.

## 2023-05-17 NOTE — Assessment & Plan Note (Addendum)
Regularly sees [facility] s/p cardioversion and ablation, now on eliquis and low dose metoprolol succinate.

## 2023-05-17 NOTE — Assessment & Plan Note (Signed)
Latest GFR 48 in setting of decreased nephron mass (s/p R nephrectomy)

## 2023-05-17 NOTE — Patient Instructions (Addendum)
Happy birthday!  Flu shot today  We will sign you up for repeat cologuard.  If interested, check with pharmacy about new 2 shot shingles series (shingrix).  Return as needed or in 1 year for next physical.

## 2023-05-17 NOTE — Assessment & Plan Note (Signed)
Reviewed limiting added sugar/simple carbs.

## 2023-05-17 NOTE — Progress Notes (Addendum)
Ph: [phone] Fax: [phone]   Patient ID: Ernest Bell., male    DOB: 05/30/1946, 77 y.o.   MRN: [id number]  This visit was conducted in person.  BP 132/64   Pulse 60 Comment: on manual check  Temp 97.8 F (36.6 C) (Temporal)   Ht 5' 4.5" (1.638 m)   Wt 154 lb 4 oz (70 kg)   SpO2 100%   BMI 26.07 kg/m    CC: CPE Subjective:   HPI: Ernest Bell. is a 77 y.o. male presenting on 05/17/2023 for Annual Exam (MCR prt 2 [AWV- 11/29/22]. )   Saw health advisor 11/2022 for medicare wellness visit. Note reviewed.   No results found.  Flowsheet Row Clinical Support from 11/29/2022 in [facility] [place]  PHQ-2 Total Score 0          11/29/2022    1:33 PM 11/25/2021   11:57 AM 11/24/2020    8:15 AM 07/31/2019    9:53 AM 07/16/2018    9:45 AM  Fall Risk   Falls in the past year? 0 0 0 0 0  Number falls in past yr: 0 0 0 0   Injury with Fall? 0 0 0 0   Risk for fall due to : No Fall Risks Other (Comment) No Fall Risks Medication side effect   Risk for fall due to: Comment  neuropathy     Follow up Falls prevention discussed;Falls evaluation completed Falls prevention discussed Falls evaluation completed;Falls prevention discussed Falls evaluation completed;Falls prevention discussed     Continues playing tennis, hunting [place] - playing in senior championship this week [place].   Discussing R knee replacement with Ortho - had steroid injections bilaterally 03/2023.   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 S/p cardioversion followed by catheter ablation at [facility]) 05/2022, continues eliquis 5mg  bid. Subsequent cardiac MRI showed new biventricular dysfunction with reduced EF 28%, mod MR, subendocardial infarct. Previously on flecainide. 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 Latest EF 50%.   Notes pulse runs 40-60s. Dizziness is better after  dropping entresto to once daily on days he plays tennis matches.   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 latest cirrhosis diagnosis reversed. Most recently released from GI care.   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 Sees urology Dr Evelene Croon as well as [facility] regularly.    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    [facility]es [facility] Malvin Johns) for neuropathy on gabapentin 300mg /900mg  daily.   Preventative: COLONOSCOPY 02/2000 - 2 hyperplastic polyps, IBS [name].  Cologuard normal 09/2019.  Prostate cancer screening - h/o BPH s/p TURP now off flomax - sees Dr. Sheppard Penton yearly.  Lung cancer screening - not eligible  Flu shot - yearly  COVID vaccine - Moderna x2, booster x1  Pneumovax-23 2012. Prevnar-13 2015 Td 2011, 03/2013  zostavax - 08/2015  shingrix - discussed. To check with pharmacy.  Advanced directives: has this at home. HCPOA is wife. Full code, but would not want prolonged life support if terminal condition. Asked to bring me copy.  Seat belt use discussed  Sunscreen use discussed. No changing moles on skin.  Non smoker Alcohol - none  Dentist q6 mo Eye exam yearly  Bowel - no constipation Bladder -  no incontinence    Lives in 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  Diet: good water, fruits/vegetables daily.      Relevant past medical, surgical, family and social history reviewed and updated as indicated. Interim medical history since our last visit reviewed. Allergies and medications reviewed and updated. Outpatient Medications Prior to Visit  Medication Sig Dispense Refill   apixaban (ELIQUIS) 5 MG TABS tablet TAKE 1 TABLET(5 MG) BY MOUTH TWICE DAILY 60  tablet 5   empagliflozin (JARDIANCE) 10 MG TABS tablet Take 1 tablet by mouth daily.     furosemide (LASIX) 20 MG tablet Take 1 tablet (20 mg total) by mouth daily as needed for edema.     rosuvastatin (CRESTOR) 10 MG tablet Take 10 mg by mouth daily.     sacubitril-valsartan (ENTRESTO) 49-51 MG Take 1 tablet by mouth 2 (two) times daily.     spironolactone (ALDACTONE) 25 MG tablet Take 0.5 tablets (12.5 mg total) by mouth daily.     tadalafil (CIALIS) 10 MG tablet Take 1-2 tablets (10-20 mg total) by mouth daily as needed for erectile dysfunction. 30 tablet 11   gabapentin (NEURONTIN) 300 MG capsule Take 300 mg by mouth 3 (three) times daily.     metoprolol succinate (TOPROL-XL) 50 MG 24 hr tablet Take 1 tablet by mouth daily.     gabapentin (NEURONTIN) 300 MG capsule Take 1 capsule (300 mg total) by mouth in the morning AND 3 capsules (900 mg total) at bedtime.     metoprolol succinate (TOPROL-XL) 25 MG 24 hr tablet Take 0.5 tablets (12.5 mg total) by mouth daily.     valsartan (DIOVAN) 40 MG tablet Take 1 tablet by mouth daily at 2 PM. (Patient not taking: Reported on 11/29/2022)     No facility-administered medications prior to visit.     Per HPI unless specifically indicated in ROS section below Review of Systems  Constitutional:  Negative for activity change, appetite change, chills, fatigue, fever and unexpected weight change.  HENT:  Negative for hearing loss.   Eyes:  Negative for visual disturbance.  Respiratory:  Negative for cough, chest tightness, shortness of breath and wheezing.   Cardiovascular:  Negative for chest pain, palpitations and leg swelling.  Gastrointestinal:  Negative for abdominal distention, abdominal pain, blood in stool, constipation, diarrhea, nausea and vomiting.  Genitourinary:  Negative for difficulty urinating and hematuria.  Musculoskeletal:  Negative for arthralgias, myalgias and neck pain.  Skin:  Negative for rash.  Neurological:  Negative for  dizziness, seizures, syncope and headaches.  Hematological:  Negative for adenopathy. Bruises/bleeds easily.  Psychiatric/Behavioral:  Negative for dysphoric mood. The patient is not nervous/anxious.     Objective:  BP 132/64   Pulse 60 Comment: on manual check  Temp 97.8 F (36.6 C) (Temporal)   Ht 5' 4.5" (1.638 m)   Wt 154 lb 4 oz (70 kg)   SpO2 100%   BMI 26.07 kg/m   Wt Readings from Last 3 Encounters:  05/17/23 154 lb 4 oz (70 kg)  11/29/22 148 lb (67.1 kg)  09/06/22 163 lb (73.9 kg)      Physical Exam Vitals and nursing note reviewed.  Constitutional:      General: He is not in acute distress.    Appearance: Normal appearance. He is well-developed. He is not ill-appearing.  HENT:  Head: Normocephalic and atraumatic.     Right Ear: Hearing, tympanic membrane, ear canal and external ear normal.     Left Ear: Hearing, tympanic membrane, ear canal and external ear normal.     Nose: Nose normal.     Mouth/Throat:     Mouth: Mucous membranes are moist.     Pharynx: Oropharynx is clear. No oropharyngeal exudate or posterior oropharyngeal erythema.  Eyes:     General: No scleral icterus.    Extraocular Movements: Extraocular movements intact.     Conjunctiva/sclera: Conjunctivae normal.     Pupils: Pupils are equal, round, and reactive to light.  Neck:     Thyroid: No thyroid mass or thyromegaly.     Vascular: No carotid bruit.  Cardiovascular:     Rate and Rhythm: Bradycardia present. Rhythm irregular.     Pulses: Normal pulses.          Radial pulses are 2+ on the right side and 2+ on the left side.     Heart sounds: Normal heart sounds. No murmur heard. Pulmonary:     Effort: Pulmonary effort is normal. No respiratory distress.     Breath sounds: Normal breath sounds. No wheezing, rhonchi or rales.  Abdominal:     General: Bowel sounds are normal. There is no distension.     Palpations: Abdomen is soft. There is no mass.     Tenderness: There is no abdominal  tenderness. There is no guarding or rebound.     Hernia: No hernia is present.  Musculoskeletal:        General: Normal range of motion.     Cervical back: Normal range of motion and neck supple.     Right lower leg: No edema.     Left lower leg: No edema.  Lymphadenopathy:     Cervical: No cervical adenopathy.  Skin:    General: Skin is warm and dry.     Findings: No rash.  Neurological:     General: No focal deficit present.     Mental Status: He is alert and oriented to person, place, and time.  Psychiatric:        Mood and Affect: Mood normal.        Behavior: Behavior normal.        Thought Content: Thought content normal.        Judgment: Judgment normal.       Results for orders placed or performed in visit on 04/25/23  VITAMIN D 25 Hydroxy (Vit-D Deficiency, Fractures)  Result Value Ref Range   VITD 42.67 30.00 - 100.00 ng/mL  Parathyroid hormone, intact (no Ca)  Result Value Ref Range   PTH 57 16 - 77 pg/mL  Microalbumin / creatinine urine ratio  Result Value Ref Range   Microalb, Ur 0.9 0.0 - 1.9 mg/dL   Creatinine,U 78.2 mg/dL   Microalb Creat Ratio 1.1 0.0 - 30.0 mg/g  Hemoglobin A1c  Result Value Ref Range   Hgb A1c MFr Bld 5.7 4.6 - 6.5 %  Comprehensive metabolic panel  Result Value Ref Range   Sodium 136 135 - 145 mEq/L   Potassium 4.8 3.5 - 5.1 mEq/L   Chloride 105 96 - 112 mEq/L   CO2 24 19 - 32 mEq/L   Glucose, Bld 111 (H) 70 - 99 mg/dL   BUN 28 (H) 6 - 23 mg/dL   Creatinine, Ser 9.56 0.40 - 1.50 mg/dL   Total Bilirubin 1.8 (H) 0.2 - 1.2 mg/dL  Alkaline Phosphatase 43 39 - 117 U/L   AST 22 0 - 37 U/L   ALT 21 0 - 53 U/L   Total Protein 6.4 6.0 - 8.3 g/dL   Albumin 4.4 3.5 - 5.2 g/dL   GFR 16.10 (L) >96.04 mL/min   Calcium 8.8 8.4 - 10.5 mg/dL  Lipid panel  Result Value Ref Range   Cholesterol 141 0 - 200 mg/dL   Triglycerides 540.9 0.0 - 149.0 mg/dL   HDL 81.19 >14.78 mg/dL   VLDL 29.5 0.0 - 62.1 mg/dL   LDL Cholesterol 73 0 - 99 mg/dL    Total CHOL/HDL Ratio 3    NonHDL 100.68     Assessment & Plan:   Problem List Items Addressed This Visit     Health maintenance examination - Primary (Chronic)    Preventative protocols reviewed and updated unless pt declined. Discussed healthy diet and lifestyle.  Agrees to repeat Cologuard.      Advanced directives, counseling/discussion (Chronic)    Advanced directives: has this at home. HCPOA is wife. Full code, but would not want prolonged life support if terminal condition. Asked to bring me copy.       Paroxysmal atrial fibrillation (HCC)    Regularly sees [facility] s/p cardioversion and ablation, now on eliquis and low dose metoprolol succinate.       Relevant Medications   metoprolol succinate (TOPROL-XL) 25 MG 24 hr tablet   spironolactone (ALDACTONE) 25 MG tablet   furosemide (LASIX) 20 MG tablet   Dyslipidemia    Chronic, good control on crestor. The 10-year ASCVD risk score (Arnett DK, et al., 2019) is: 32.3%   Values used to calculate the score:     Age: 81 years     Sex: Male     Is Non-Hispanic African American: No     Diabetic: No     Tobacco smoker: No     Systolic Blood Pressure: 132 mmHg     Is BP treated: Yes     HDL Cholesterol: 40.6 mg/dL     Total Cholesterol: 141 mg/dL       Liver cirrhosis secondary to NASH (nonalcoholic steatohepatitis) (HCC)    On latest [facility] liver clinic evaluation, this diagnosis was reversed, released from liver clinic care.       Prediabetes    Reviewed limiting added sugar/simple carbs.       Neuropathy    Chronic, present for several years, followed by neurology now on gabapentin 300/900mg  daily.       Relevant Medications   gabapentin (NEURONTIN) 300 MG capsule   Renal cell carcinoma of right kidney [facility])    Sees [facility] urology and uro-oncology regularly.       BPH (benign prostatic hyperplasia)    S/p TURP. Now off flomax. Sees Dr Evelene Croon yearly.       Chronic kidney disease, stage 3a (HCC)    Latest GFR  48 in setting of decreased nephron mass (s/p R nephrectomy)       Atypical atrial flutter (HCC)   Relevant Medications   metoprolol succinate (TOPROL-XL) 25 MG 24 hr tablet   spironolactone (ALDACTONE) 25 MG tablet   furosemide (LASIX) 20 MG tablet   Cardiomyopathy [facility])    Regularly sees [facility]ology, now on entresto, toprol XL, jardiance and spironolactone.       Relevant Medications   metoprolol succinate (TOPROL-XL) 25 MG 24 hr tablet   spironolactone (ALDACTONE) 25 MG tablet   furosemide (LASIX) 20 MG tablet  Bilateral primary osteoarthritis of knee    R>L knees, s/p steroid injection over summer, followed by ortho and discussing R knee replacement 09/2023.       Other Visit Diagnoses     Encounter for immunization       Relevant Orders   Flu Vaccine Trivalent High Dose (Fluad) (Completed)   Special screening for malignant neoplasms, colon       Relevant Orders   Cologuard        No orders of the defined types were placed in this encounter.   Orders Placed This Encounter  Procedures   Flu Vaccine Trivalent High Dose (Fluad)   Cologuard    Patient Instructions  Happy birthday!  Flu shot today  We will sign you up for repeat cologuard.  If interested, check with pharmacy about new 2 shot shingles series (shingrix).  Return as needed or in 1 year for next physical.   Follow up plan: Return in about 1 year (around 05/16/2024) for medicare wellness visit, annual exam, prior fasting for blood work.  Eustaquio Boyden, MD

## 2023-05-17 NOTE — Assessment & Plan Note (Signed)
S/p TURP. Now off flomax. Sees Dr Evelene Croon yearly.

## 2023-05-17 NOTE — Assessment & Plan Note (Addendum)
Preventative protocols reviewed and updated unless pt declined. Discussed healthy diet and lifestyle.  Agrees to repeat Cologuard.

## 2023-05-17 NOTE — Assessment & Plan Note (Signed)
On latest [facility] liver clinic evaluation, this diagnosis was reversed, released from liver clinic care.

## 2023-05-17 NOTE — Assessment & Plan Note (Signed)
Sees [facility] urology and uro-oncology regularly.

## 2023-06-05 LAB — COLOGUARD

## 2023-06-17 LAB — COLOGUARD: COLOGUARD: POSITIVE — AB

## 2023-06-19 ENCOUNTER — Other Ambulatory Visit: Payer: Self-pay | Admitting: Family Medicine

## 2023-06-19 DIAGNOSIS — R195 Other fecal abnormalities: Secondary | ICD-10-CM

## 2023-06-27 ENCOUNTER — Encounter: Payer: Self-pay | Admitting: Family Medicine

## 2023-08-07 ENCOUNTER — Telehealth: Payer: Self-pay

## 2023-08-07 NOTE — Telephone Encounter (Signed)
Pt dropped off pre-op clearance form from Bridgett Larsson of [facility]. Pt needs R TKA. Surgery is scheduled  for 09/17/22 by Dr Georgena Spurling.   Spoke with pt scheduling pre-op eval on 08/14/23 at 8:00.  (Form in basket on Lisa's desk.)

## 2023-08-14 ENCOUNTER — Ambulatory Visit: Payer: Medicare PPO | Admitting: Family Medicine

## 2023-08-14 ENCOUNTER — Encounter: Payer: Self-pay | Admitting: Family Medicine

## 2023-08-14 VITALS — BP 138/72 | HR 62 | Temp 97.9°F | Ht 64.5 in | Wt 162.4 lb

## 2023-08-14 DIAGNOSIS — N1831 Chronic kidney disease, stage 3a: Secondary | ICD-10-CM

## 2023-08-14 DIAGNOSIS — Z01818 Encounter for other preprocedural examination: Secondary | ICD-10-CM | POA: Diagnosis not present

## 2023-08-14 DIAGNOSIS — C641 Malignant neoplasm of right kidney, except renal pelvis: Secondary | ICD-10-CM

## 2023-08-14 DIAGNOSIS — R7303 Prediabetes: Secondary | ICD-10-CM

## 2023-08-14 DIAGNOSIS — I5032 Chronic diastolic (congestive) heart failure: Secondary | ICD-10-CM | POA: Insufficient documentation

## 2023-08-14 DIAGNOSIS — I480 Paroxysmal atrial fibrillation: Secondary | ICD-10-CM | POA: Diagnosis not present

## 2023-08-14 DIAGNOSIS — I311 Chronic constrictive pericarditis: Secondary | ICD-10-CM

## 2023-08-14 DIAGNOSIS — I429 Cardiomyopathy, unspecified: Secondary | ICD-10-CM

## 2023-08-14 DIAGNOSIS — M170 Bilateral primary osteoarthritis of knee: Secondary | ICD-10-CM

## 2023-08-14 LAB — POCT GLYCOSYLATED HEMOGLOBIN (HGB A1C): Hemoglobin A1C: 5.9 % — AB (ref 4.0–5.6)

## 2023-08-14 NOTE — Assessment & Plan Note (Signed)
Update A1c ?

## 2023-08-14 NOTE — Patient Instructions (Signed)
A1c today.  EKG today We will forward results to Dr Tobin Chad office. Touch base with cardiology regarding cardiac clearance and eliquis hold prior to surgery.  Good to see you today. I hope you have a speedy recovery!

## 2023-08-14 NOTE — Assessment & Plan Note (Signed)
Latest GFR 1.3.

## 2023-08-14 NOTE — Assessment & Plan Note (Signed)
H/o this, no recurrence since cardioversion followed by catheter ablation 05/2022.  Eliquis through [facility] cardiology - will defer perioperative anticoagulatn management to them.

## 2023-08-14 NOTE — Assessment & Plan Note (Signed)
Latest EF 50%.

## 2023-08-14 NOTE — Assessment & Plan Note (Signed)
RCRI = 1 (h/o CHF)  Anticipate adequately low risk to proceed with planned surgical intervention. Medical release provided today. Recommend cardiac clearance come through [facility] cardiologist Dr Michae Kava, with eliquis hold recommendations through their office as well.  He had recent labs and imaging through [facility] (seen 08/01/2023) Update EKG today.  Will forward today's office visit attn Dr Tobin Chad office.

## 2023-08-14 NOTE — Assessment & Plan Note (Signed)
S/p R nephrectomy 07/2021, regularly sees [facility]ology

## 2023-08-14 NOTE — Progress Notes (Signed)
Ph: [phone] Fax: [phone]   Patient ID: Ernest Baptist., male    DOB: [birth year], 77 y.o.   MRN: [id number]  This visit was conducted in person.  BP 138/72   Pulse 62   Temp 97.9 F (36.6 C) (Oral)   Ht 5' 4.5" (1.638 m)   Wt 162 lb 6 oz (73.7 kg)   SpO2 98%   BMI 27.44 kg/m    CC: preop eval  Subjective:   HPI: Ernest Petri. is a 77 y.o. male presenting on 08/14/2023 for Pre-op Exam (Pt needs R TKA. Surgery is scheduled  for 09/17/22 by Dr Georgena Spurling. )   Ernest Baptist.  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  Planned upcoming R total knee replacement under spinal anesthesia by Dr  Sherlean Foot scheduled for 09/18/2023. Recommended labwork prior - CBC, CMP, A1c, UA, CXR, EKG.   Patient has tolerated anesthesia well in the past.  Latest surgical intervention was R radical nephrectomy for renal cell cancer 07/2021 followed by TURP 10/2021.  Denies trouble with post-op nausea/vomiting, or trouble awakening after surgery.   Denies chest pain, dyspnea, palpitations, leg swelling, HA, dizziness.  No fevers/chills, coughing, abd pain, diarrhea, or UTI symptoms.   Lab Results  Component Value Date   HGBA1C 5.9 (A) 08/14/2023   Afib with atypical atrial flutter and constrictive pericarditis sees [facility] Jacquelyne Balint NP  last 06/2023 and [facility] Dr Michae Kava last 03/2023. S/p cardioversion followed by catheter ablation at [facility] (Dr Macon Large) 05/2022 without subsequent afib/aflutter, but continues eliquis 5mg  bid. On Toprol XL as well as Entresto. Subsequent cardiac MRI showed new biventricular dysfunction with reduced EF 28%, mod MR, subendocardial infarct. Previously on flecainide. 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 Latest EF 50%. H/o asymptomatic PACs.  Will need cardiac clearance through cardiologist.   Neuropathy - continues gabapentin 300/900mg  daily.   Pending colonoscopy through [facility] for positive Cologuard 06/2023.   He did have CT and chest xray through [facility] last week.      Relevant past medical, surgical, family and social history reviewed and updated as indicated. Interim medical history since our last visit reviewed. Allergies and medications reviewed and updated. Outpatient Medications Prior to Visit  Medication Sig Dispense Refill   apixaban (ELIQUIS) 5 MG TABS tablet TAKE 1 TABLET(5 MG) BY MOUTH TWICE DAILY 60 tablet 5   ENTRESTO 49-51 MG Take 1 tablet by mouth every 12 (twelve) hours.     furosemide (LASIX) 20 MG tablet Take 1 tablet (20 mg total) by mouth daily as needed for edema.     gabapentin (NEURONTIN) 300 MG capsule Take 1 capsule (300 mg total) by mouth in the morning AND 3 capsules (900 mg total) at bedtime.     metoprolol succinate (TOPROL-XL) 25 MG 24 hr tablet Take 0.5 tablets (12.5 mg total) by mouth daily.     spironolactone (ALDACTONE) 25 MG tablet Take 0.5 tablets (12.5 mg total) by mouth daily.     tadalafil (CIALIS) 10 MG tablet Take  1-2 tablets (10-20 mg total) by mouth daily as needed for erectile dysfunction. 30 tablet 11   rosuvastatin (CRESTOR) 10 MG tablet Take 10 mg by mouth daily.     empagliflozin (JARDIANCE) 10 MG TABS tablet Take 1 tablet by mouth daily.     No facility-administered medications prior to visit.     Per HPI unless  specifically indicated in ROS section below Review of Systems  Objective:  BP 138/72   Pulse 62   Temp 97.9 F (36.6 C) (Oral)   Ht 5' 4.5" (1.638 m)   Wt 162 lb 6 oz (73.7 kg)   SpO2 98%   BMI 27.44 kg/m   Wt Readings from Last 3 Encounters:  08/14/23 162 lb 6 oz (73.7 kg)  05/17/23 154 lb 4 oz (70 kg)  11/29/22 148 lb (67.1 kg)      Physical Exam Vitals and nursing note reviewed.  Constitutional:      Appearance: Normal appearance. He is not ill-appearing.  HENT:     Head: Normocephalic and atraumatic.     Mouth/Throat:     Mouth: Mucous membranes are moist.     Pharynx: Oropharynx is clear. No oropharyngeal exudate or posterior oropharyngeal erythema.  Eyes:     Extraocular Movements: Extraocular movements intact.     Pupils: Pupils are equal, round, and reactive to light.  Cardiovascular:     Rate and Rhythm: Normal rate and regular rhythm.     Pulses: Normal pulses.     Heart sounds: Normal heart sounds. No murmur heard.    Comments: Sounds regular Pulmonary:     Effort: Pulmonary effort is normal. No respiratory distress.     Breath sounds: Normal breath sounds. No wheezing, rhonchi or rales.  Musculoskeletal:     Right lower leg: No edema.     Left lower leg: No edema.  Skin:    General: Skin is warm and dry.     Findings: No rash.  Neurological:     Mental Status: He is alert.  Psychiatric:        Mood and Affect: Mood normal.        Behavior: Behavior normal.       Results for orders placed or performed in visit on 08/14/23  POCT glycosylated hemoglobin (Hb A1C)  Result Value Ref Range   Hemoglobin A1C 5.9 (A) 4.0 - 5.6 %   HbA1c POC (<> result, manual entry)     HbA1c, POC (prediabetic range)     HbA1c, POC (controlled diabetic range)     [facility] imaging from 08/01/2023:  X-ray chest PA and lateral  Two view chest  Indication:  C64.1 Malignant neoplasm of right kidney, except renal pelvis  (CMS/HHS-HCC)  Comparisons: 01/25/2023  Findings and  Impression:  PA and lateral views of the chest.  1. Stable cardiac and mediastinal contours status post sternotomy.  2. Minimal chronic reticular opacities throughout the lung bases. No  abnormal pulmonary opacity visualized. No pleural effusion.  3. Mild lower thoracic spine degenerative change.  4. Right upper quadrant surgical clips.  Electronically Signed by:  Beckie Salts, MD, [facility]  Electronically Signed on:  08/01/2023 10:51 AM   [facility] labs from 08/01/2023: Comprehensive Metabolic Panel (CMP)  Sodium 135 - 145 mmol/L 134 Low    Potassium 3.5 - 5.0 mmol/L 4.8   Chloride 98 - 108 mmol/L 104   Carbon Dioxide (CO2) 21 - 30 mmol/L 24   Urea Nitrogen (BUN) 7 - 20 mg/dL 25 High  Creatinine 0.6 - 1.3 mg/dL 1.3   Glucose 70 - 409 mg/dL 93 Interpretive Data: Above is the NONFASTING reference range.  Below are the FASTING reference ranges: NORMAL:      70-99 mg/dL PREDIABETES: 811-914 mg/dL DIABETES:    > 782 mg/dL  Calcium 8.7 - 95.6 mg/dL 9.3   AST (Aspartate Aminotransferase) 15 - 41 U/L 22   ALT (Alanine Aminotransferase) 15 - 50 U/L 21   Bilirubin, Total 0.4 - 1.5 mg/dL 1.9 High    Alk Phos (Alkaline Phosphatase) 24 - 110 U/L 52   Albumin 3.5 - 4.8 g/dL 4.3   Protein, Total 6.2 - 8.1 g/dL 6.9   Anion Gap 3 - 12 mmol/L 6   BUN/CREA Ratio 6 - 27 19   Glomerular Filtration Rate (eGFR) mL/min/1.73sq m 57    Complete Blood Count (CBC)  WBC (White Blood Cell Count) 3.2 - 9.8 x10^9/L 9.2  Hemoglobin 13.7 - 17.3 g/dL 21.3  Hematocrit 08.6 - 49.0 % 45.4  Platelets 150 - 450 x10^9/L 235  MCV (Mean Corpuscular Volume) 80 - 98 fL 91  MCH (Mean Corpuscular Hemoglobin) 26.5 - 34.0 pg 30.9  MCHC (Mean Corpuscular Hemoglobin Concentration) 31.5 - 36.3 % 33.9  RBC (Red Blood Cell Count) 4.37 - 5.74 x10^12/L 4.99  RDW-CV (Red Cell Distribution Width) 11.5 - 14.5 % 13  NRBC (Nucleated Red Blood Cell Count) 0 x10^9/L 0  NRBC % (Nucleated Red  Blood Cell %) % 0  MPV (Mean Platelet Volume) 7.2 - 11.7 fL 10.1   Lab Results  Component Value Date   NA 136 04/25/2023   CL 105 04/25/2023   K 4.8 04/25/2023   CO2 24 04/25/2023   BUN 28 (H) 04/25/2023   CREATININE 1.42 04/25/2023   GFR 47.85 (L) 04/25/2023   CALCIUM 8.8 04/25/2023   ALBUMIN 4.4 04/25/2023   GLUCOSE 111 (H) 04/25/2023   EKG today - sinus bradycardia rate 50s, frequent PACs, LAD, normal intervals, no hypertrophy or acute ST/T changes   Assessment & Plan:   Problem List Items Addressed This Visit     Constrictive pericarditis   Relevant Medications   ENTRESTO 49-51 MG   Paroxysmal atrial fibrillation (HCC)    H/o this, no recurrence since cardioversion followed by catheter ablation 05/2022.  Eliquis through [facility] - will defer perioperative anticoagulatn management to them.       Relevant Medications   ENTRESTO 49-51 MG   Prediabetes    Update A1c.       Renal cell carcinoma of right kidney [facility])    S/p R nephrectomy 07/2021, regularly sees [facility]      Chronic kidney disease, stage 3a (HCC)    Latest GFR 1.3.       Cardiomyopathy (HCC)   Relevant Medications   ENTRESTO 49-51 MG   Bilateral primary osteoarthritis of knee   Pre-op exam - Primary    RCRI = 1 (h/o CHF)  Anticipate adequately low risk to proceed with planned surgical intervention. Medical release provided today. Recommend cardiac clearance come through [facility] cardiologist Dr Michae Kava, with eliquis hold recommendations through their office as well.  He had recent labs and imaging through [facility] (seen 08/01/2023) Update EKG today.  Will forward today's office visit attn Dr Tobin Chad office.       Relevant Orders   EKG 12-Lead (Completed)   POCT glycosylated hemoglobin (Hb A1C) (Completed)   Chronic heart failure with preserved ejection fraction (HFpEF) (HCC)    Latest EF 50%.  Relevant Medications   ENTRESTO 49-51 MG     No orders of the defined  types were placed in this encounter.   Orders Placed This Encounter  Procedures   POCT glycosylated hemoglobin (Hb A1C)   EKG 12-Lead    Patient Instructions  A1c today.  EKG today We will forward results to Dr Tobin Chad office. Touch base with cardiology regarding cardiac clearance and eliquis hold prior to surgery.  Good to see you today. I hope you have a speedy recovery!   Follow up plan: Return if symptoms worsen or fail to improve.  Eustaquio Boyden, MD

## 2023-08-15 NOTE — Telephone Encounter (Signed)
Faxed form, OV notes and all results to [facility] at [phone] and [phone].

## 2023-09-06 DIAGNOSIS — Z96651 Presence of right artificial knee joint: Secondary | ICD-10-CM | POA: Insufficient documentation

## 2023-09-12 ENCOUNTER — Ambulatory Visit: Payer: Medicare PPO | Admitting: Urology

## 2023-09-12 VITALS — BP 134/68 | Ht 64.5 in | Wt 155.0 lb

## 2023-09-12 DIAGNOSIS — C641 Malignant neoplasm of right kidney, except renal pelvis: Secondary | ICD-10-CM

## 2023-09-12 DIAGNOSIS — N138 Other obstructive and reflux uropathy: Secondary | ICD-10-CM

## 2023-09-12 DIAGNOSIS — N529 Male erectile dysfunction, unspecified: Secondary | ICD-10-CM

## 2023-09-12 DIAGNOSIS — Z85528 Personal history of other malignant neoplasm of kidney: Secondary | ICD-10-CM | POA: Diagnosis not present

## 2023-09-12 DIAGNOSIS — N401 Enlarged prostate with lower urinary tract symptoms: Secondary | ICD-10-CM | POA: Diagnosis not present

## 2023-09-12 LAB — BLADDER SCAN AMB NON-IMAGING

## 2023-09-12 MED ORDER — TADALAFIL 10 MG PO TABS: 10.0000 mg | ORAL_TABLET | Freq: Every day | ORAL | 11 refills | Status: DC | PRN

## 2023-09-12 NOTE — Progress Notes (Signed)
   09/12/2023 1:54 PM   Ernest FORBES Links Jr. 11/17/1945 [id number]  Reason for visit: Follow up hx of RCC, BPH/LUTS, history of bladder neck contracture/urethral stricture, ED  HPI: 78 year old male previously followed by Dr. Gala as well as [facility] for the above issues.  He underwent a right radical laparoscopic nephrectomy at [facility] in November 2022 for a 4 cm right renal mass, no evidence of recurrence and continues to get routine surveillance imaging with [facility], most recently in November 2024 which was benign.  He previously was followed by Dr. Gala for BPH and underwent a microwave prostate procedure in 2017, as well as a greenlight laser in February 2023 for urinary retention.  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 Gala in July 2023 with laser incision and Optilume balloon dilation.  PSA has been normal, most recently at 0.33 in August 2023, no further screening needed per guideline recommendations.  His past medical history is also notable for cirrhosis which is managed at [facility].  He denies any change in urinary symptoms over the last year, no specific urinary complaints today, PVR normal at 69ml.  At our visit in January 2024 we had trialed Cialis  for ED, and he had no significant improvement on that medication.  It sounds like he was only taking a 5 mg dose.  I recommended trying the Cialis  10 mg daily with a 10 mg boost dose as needed, or taking 20 mg as needed prior to sexual activity.  Risk and benefits were discussed.  He previously had bothersome side effects from Viagra.  Could consider testosterone value in the future.  -Continue follow-up surveillance with [facility] for history of RCC treated with right radical nephrectomy in 2022 -Trial of Cialis  10 mg daily with 10 mg boost dose as needed, consider checking testosterone if persistent ED -RTC 1 year PVR, symptom check   Ernest JAYSON Burnet, MD  [facility] [address] [place], [place] [address] [phone]

## 2023-10-16 ENCOUNTER — Ambulatory Visit (INDEPENDENT_AMBULATORY_CARE_PROVIDER_SITE_OTHER): Payer: Medicare PPO | Admitting: Dermatology

## 2023-10-16 DIAGNOSIS — L820 Inflamed seborrheic keratosis: Secondary | ICD-10-CM

## 2023-10-16 DIAGNOSIS — Z1283 Encounter for screening for malignant neoplasm of skin: Secondary | ICD-10-CM | POA: Diagnosis not present

## 2023-10-16 DIAGNOSIS — L570 Actinic keratosis: Secondary | ICD-10-CM

## 2023-10-16 DIAGNOSIS — W908XXA Exposure to other nonionizing radiation, initial encounter: Secondary | ICD-10-CM

## 2023-10-16 DIAGNOSIS — D229 Melanocytic nevi, unspecified: Secondary | ICD-10-CM

## 2023-10-16 DIAGNOSIS — L814 Other melanin hyperpigmentation: Secondary | ICD-10-CM | POA: Diagnosis not present

## 2023-10-16 DIAGNOSIS — L578 Other skin changes due to chronic exposure to nonionizing radiation: Secondary | ICD-10-CM | POA: Diagnosis not present

## 2023-10-16 DIAGNOSIS — Z85828 Personal history of other malignant neoplasm of skin: Secondary | ICD-10-CM

## 2023-10-16 DIAGNOSIS — D1801 Hemangioma of skin and subcutaneous tissue: Secondary | ICD-10-CM

## 2023-10-16 DIAGNOSIS — L821 Other seborrheic keratosis: Secondary | ICD-10-CM

## 2023-10-16 NOTE — Patient Instructions (Addendum)
Actinic keratoses are precancerous spots that appear secondary to cumulative UV radiation exposure/sun exposure over time. They are chronic with expected duration over 1 year. A portion of actinic keratoses will progress to squamous cell carcinoma of the skin. It is not possible to reliably predict which spots will progress to skin cancer and so treatment is recommended to prevent development of skin cancer.  Recommend daily broad spectrum sunscreen SPF 30+ to sun-exposed areas, reapply every 2 hours as needed.  Recommend staying in the shade or wearing long sleeves, sun glasses (UVA+UVB protection) and wide brim hats (4-inch brim around the entire circumference of the hat). Call for new or changing lesions.      Seborrheic Keratosis  What causes seborrheic keratoses? Seborrheic keratoses are harmless, common skin growths that first appear during adult life.  As time goes by, more growths appear.  Some people may develop a large number of them.  Seborrheic keratoses appear on both covered and uncovered body parts.  They are not caused by sunlight.  The tendency to develop seborrheic keratoses can be inherited.  They vary in color from skin-colored to gray, brown, or even black.  They can be either smooth or have a rough, warty surface.   Seborrheic keratoses are superficial and look as if they were stuck on the skin.  Under the microscope this type of keratosis looks like layers upon layers of skin.  That is why at times the top layer may seem to fall off, but the rest of the growth remains and re-grows.    Treatment Seborrheic keratoses do not need to be treated, but can easily be removed in the office.  Seborrheic keratoses often cause symptoms when they rub on clothing or jewelry.  Lesions can be in the way of shaving.  If they become inflamed, they can cause itching, soreness, or burning.  Removal of a seborrheic keratosis can be accomplished by freezing, burning, or surgery. If any spot bleeds,  scabs, or grows rapidly, please return to have it checked, as these can be an indication of a skin cancer.   Cryotherapy Aftercare  Wash gently with soap and water everyday.   Apply Vaseline and Band-Aid daily until healed.       Melanoma ABCDEs  Melanoma is the most dangerous type of skin cancer, and is the leading cause of death from skin disease.  You are more likely to develop melanoma if you: Have light-colored skin, light-colored eyes, or red or blond hair Spend a lot of time in the sun Tan regularly, either outdoors or in a tanning bed Have had blistering sunburns, especially during childhood Have a close family member who has had a melanoma Have atypical moles or large birthmarks  Early detection of melanoma is key since treatment is typically straightforward and cure rates are extremely high if we catch it early.   The first sign of melanoma is often a change in a mole or a new dark spot.  The ABCDE system is a way of remembering the signs of melanoma.  A for asymmetry:  The two halves do not match. B for border:  The edges of the growth are irregular. C for color:  A mixture of colors are present instead of an even brown color. D for diameter:  Melanomas are usually (but not always) greater than 6mm - the size of a pencil eraser. E for evolution:  The spot keeps changing in size, shape, and color.  Please check your skin once per month  between visits. You can use a small mirror in front and a large mirror behind you to keep an eye on the back side or your body.   If you see any new or changing lesions before your next follow-up, please call to schedule a visit.  Please continue daily skin protection including broad spectrum sunscreen SPF 30+ to sun-exposed areas, reapplying every 2 hours as needed when you're outdoors.   Staying in the shade or wearing long sleeves, sun glasses (UVA+UVB protection) and wide brim hats (4-inch brim around the entire circumference of the  hat) are also recommended for sun protection.    Due to recent changes in healthcare laws, you may see results of your pathology and/or laboratory studies on MyChart before the doctors have had a chance to review them. We understand that in some cases there may be results that are confusing or concerning to you. Please understand that not all results are received at the same time and often the doctors may need to interpret multiple results in order to provide you with the best plan of care or course of treatment. Therefore, we ask that you please give [place] 2 business days to thoroughly review all your results before contacting the office for clarification. Should we see a critical lab result, you will be contacted sooner.   If You Need Anything After Your Visit  If you have any questions or concerns for your doctor, please call our main line at [phone] and press option 4 to reach your doctor's medical assistant. If no one answers, please leave a voicemail as directed and we will return your call as soon as possible. Messages left after 4 pm will be answered the following business day.   You may also send [place] a message via MyChart. We typically respond to MyChart messages within 1-2 business days.  For prescription refills, please ask your pharmacy to contact our office. Our fax number is [phone].  If you have an urgent issue when the clinic is closed that cannot wait until the next business day, you can page your doctor at the number below.    Please note that while we do our best to be available for urgent issues outside of office hours, we are not available 24/7.   If you have an urgent issue and are unable to reach [place], you may choose to seek medical care at your doctor's office, retail clinic, urgent care center, or emergency room.  If you have a medical emergency, please immediately call 911 or go to the emergency department.  Pager Numbers  - Dr. Gwen Pounds: [phone]  - Dr.  Roseanne Reno: [phone]  - Dr. Katrinka Blazing: [phone]   In the event of inclement weather, please call our main line at [phone] for an update on the status of any delays or closures.  Dermatology Medication Tips: Please keep the boxes that topical medications come in in order to help keep track of the instructions about where and how to use these. Pharmacies typically print the medication instructions only on the boxes and not directly on the medication tubes.   If your medication is too expensive, please contact our office at [phone] option 4 or send [place] a message through MyChart.   We are unable to tell what your co-pay for medications will be in advance as this is different depending on your insurance coverage. However, we may be able to find a substitute medication at lower cost or fill out paperwork to get insurance to cover a  needed medication.   If a prior authorization is required to get your medication covered by your insurance company, please allow [place] 1-2 business days to complete this process.  Drug prices often vary depending on where the prescription is filled and some pharmacies may offer cheaper prices.  The website www.goodrx.com contains coupons for medications through different pharmacies. The prices here do not account for what the cost may be with help from insurance (it may be cheaper with your insurance), but the website can give you the price if you did not use any insurance.  - You can print the associated coupon and take it with your prescription to the pharmacy.  - You may also stop by our office during regular business hours and pick up a GoodRx coupon card.  - If you need your prescription sent electronically to a different pharmacy, notify our office through [facility] or by phone at [phone] option 4.     Si Usted Necesita Algo Despus de Su Visita  Tambin puede enviarnos un mensaje a travs de Clinical cytogeneticist. Por lo general respondemos a los  mensajes de MyChart en el transcurso de 1 a 2 das hbiles.  Para renovar recetas, por favor pida a su farmacia que se ponga en contacto con nuestra oficina. Annie Sable de fax es [place] [phone].  Si tiene un asunto urgente cuando la clnica est cerrada y que no puede esperar hasta el siguiente da hbil, puede llamar/localizar a su doctor(a) al nmero que aparece a continuacin.   Por favor, tenga en cuenta que aunque hacemos todo lo posible para estar disponibles para asuntos urgentes fuera del horario de [place], no estamos disponibles las 24 horas del da, los 7 [address]  [address] de la [place].   Si tiene un problema urgente y no puede comunicarse con nosotros, puede optar por buscar atencin mdica  en el consultorio de su doctor(a), en una clnica privada, en un centro de atencin urgente o en una sala de emergencias.  Si tiene Engineer, drilling, por favor llame inmediatamente al 911 o vaya a la sala de emergencias.  Nmeros de bper  - Dr. Gwen Pounds: [phone]  - Dra. Roseanne Reno: [phone]  - Dr. Katrinka Blazing: [phone]   En caso de inclemencias del tiempo, por favor llame a Lacy Duverney principal al [phone] para una actualizacin sobre el [place] de cualquier retraso o cierre.  Consejos para la medicacin en dermatologa: Por favor, guarde las cajas en las que vienen los medicamentos de uso tpico para ayudarle a seguir las instrucciones sobre dnde y cmo usarlos. Las farmacias generalmente imprimen las instrucciones del medicamento slo en las cajas y no directamente en los tubos del [place].   Si su medicamento es muy caro, por favor, pngase en contacto con Rolm Gala llamando al [phone] y presione la opcin 4 o envenos un mensaje a travs de Clinical cytogeneticist.   No podemos decirle cul ser su copago por los medicamentos por adelantado ya que esto es diferente dependiendo de la cobertura de su seguro. Sin embargo, es posible que podamos encontrar un medicamento sustituto a  Audiological scientist un formulario para que el seguro cubra el medicamento que se considera necesario.   Si se requiere una autorizacin previa para que su compaa de seguros [place] su medicamento, por favor permtanos de 1 a 2 das hbiles para completar [address]  Los precios de los medicamentos varan con frecuencia dependiendo del Environmental consultant de dnde se surte la receta y alguna farmacias pueden ofrecer precios ms baratos.  El sitio web www.goodrx.com tiene cupones para medicamentos de Health and safety inspector. Los precios aqu no tienen en cuenta lo que podra costar con la ayuda del seguro (puede ser ms barato con su seguro), pero el sitio web puede darle el precio si no utiliz Tourist information centre manager.  - Puede imprimir el cupn correspondiente y llevarlo con su receta a la farmacia.  - Tambin puede pasar por nuestra oficina durante el horario de atencin regular y Education officer, museum una tarjeta de cupones de GoodRx.  - Si necesita que su receta se enve electrnicamente a una farmacia diferente, informe a nuestra oficina a travs de MyChart de  o por telfono llamando al [phone] y presione la opcin 4.

## 2023-10-16 NOTE — Progress Notes (Signed)
 Follow-Up Visit   Subjective  Ernest Bell. is a 78 y.o. male who presents for the following: Skin Cancer Screening and Full Body Skin Exam Places on left side of face, behind left ear, right side burn area, places on back he would like checked.  Patient has hx of bcc.     The patient presents for Total-Body Skin Exam (TBSE) for skin cancer screening and mole check. The patient has spots, moles and lesions to be evaluated, some may be new or changing and the patient may have concern these could be cancer.    The following portions of the chart were reviewed this encounter and updated as appropriate: medications, allergies, medical history  Review of Systems:  No other skin or systemic complaints except as noted in HPI or Assessment and Plan.  Objective  Well appearing patient in no apparent distress; mood and affect are within normal limits.  A full examination was performed including scalp, head, eyes, ears, nose, lips, neck, chest, axillae, abdomen, back, buttocks, bilateral upper extremities, bilateral lower extremities, hands, feet, fingers, toes, fingernails, and toenails. All findings within normal limits unless otherwise noted below.   Relevant physical exam findings are noted in the Assessment and Plan.  Left Postauricular Area x 1, right jaw x 2 (3) Erythematous stuck-on, waxy papule  Residual on right jaw  left temple x 1 Erythematous thin papules/macules with gritty scale.   Assessment & Plan   SKIN CANCER SCREENING PERFORMED TODAY.  ACTINIC DAMAGE - Chronic condition, secondary to cumulative UV/sun exposure - diffuse scaly erythematous macules with underlying dyspigmentation - Recommend daily broad spectrum sunscreen SPF 30+ to sun-exposed areas, reapply every 2 hours as needed.  - Staying in the shade or wearing long sleeves, sun glasses (UVA+UVB protection) and wide brim hats (4-inch brim around the entire circumference of the hat) are also recommended  for sun protection.  - Call for new or changing lesions.  LENTIGINES, SEBORRHEIC KERATOSES, HEMANGIOMAS - Benign normal skin lesions - Benign-appearing - Call for any changes  MELANOCYTIC NEVI - Tan-brown and/or pink-flesh-colored symmetric macules and papules - Benign appearing on exam today - Observation - Call clinic for new or changing moles - Recommend daily use of broad spectrum spf 30+ sunscreen to sun-exposed areas.   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  - Call if any new or changing lesions are noted between office visits   INFLAMED SEBORRHEIC KERATOSIS (3) Left Postauricular Area x 1, right jaw x 2 (3) Symptomatic, irritating, patient would like treated. Destruction of lesion - Left Postauricular Area x 1, right jaw x 2 (3)  Destruction method: cryotherapy   Informed consent: discussed and consent obtained   Lesion destroyed using liquid nitrogen: Yes   Region frozen until ice ball extended beyond lesion: Yes   Outcome: patient tolerated procedure well with no complications   Post-procedure details: wound care instructions given   Additional details:  Prior to procedure, discussed risks of blister formation, small wound, skin dyspigmentation, or rare scar following cryotherapy. Recommend Vaseline ointment to treated areas while healing.  ACTINIC KERATOSIS left temple x 1 Actinic keratoses are precancerous spots that appear secondary to cumulative UV radiation exposure/sun exposure over time. They are chronic with expected duration over 1 year. A portion of actinic keratoses  will progress to squamous cell carcinoma of the skin. It is not possible to reliably predict which spots will progress to skin cancer and so treatment is recommended to prevent development  of skin cancer.  Recommend daily broad spectrum sunscreen SPF 30+ to sun-exposed areas, reapply every 2 hours as needed.  Recommend staying in the shade or wearing long sleeves, sun glasses (UVA+UVB protection) and wide brim hats (4-inch brim around the entire circumference of the hat). Call for new or changing lesions. Destruction of lesion - left temple x 1  Destruction method: cryotherapy   Informed consent: discussed and consent obtained   Lesion destroyed using liquid nitrogen: Yes   Region frozen until ice ball extended beyond lesion: Yes   Outcome: patient tolerated procedure well with no complications   Post-procedure details: wound care instructions given   Additional details:  Prior to procedure, discussed risks of blister formation, small wound, skin dyspigmentation, or rare scar following cryotherapy. Recommend Vaseline ointment to treated areas while healing.  Return in about 1 year (around 10/15/2024) for TBSE.  I, Randee Busing, CMA, am acting as scribe for Artemio Larry, MD.   Documentation: I have reviewed the above documentation for accuracy and completeness, and I agree with the above.  Artemio Larry, MD

## 2023-11-06 DIAGNOSIS — Z96651 Presence of right artificial knee joint: Secondary | ICD-10-CM | POA: Diagnosis not present

## 2023-11-06 DIAGNOSIS — R2689 Other abnormalities of gait and mobility: Secondary | ICD-10-CM | POA: Diagnosis not present

## 2023-11-06 DIAGNOSIS — R200 Anesthesia of skin: Secondary | ICD-10-CM | POA: Diagnosis not present

## 2023-11-06 DIAGNOSIS — R202 Paresthesia of skin: Secondary | ICD-10-CM | POA: Diagnosis not present

## 2023-11-06 DIAGNOSIS — R208 Other disturbances of skin sensation: Secondary | ICD-10-CM | POA: Diagnosis not present

## 2023-11-06 DIAGNOSIS — M25561 Pain in right knee: Secondary | ICD-10-CM | POA: Diagnosis not present

## 2023-11-06 DIAGNOSIS — M79675 Pain in left toe(s): Secondary | ICD-10-CM | POA: Diagnosis not present

## 2023-11-06 DIAGNOSIS — Z85528 Personal history of other malignant neoplasm of kidney: Secondary | ICD-10-CM | POA: Diagnosis not present

## 2023-11-06 DIAGNOSIS — G629 Polyneuropathy, unspecified: Secondary | ICD-10-CM | POA: Diagnosis not present

## 2023-11-10 DIAGNOSIS — Z96651 Presence of right artificial knee joint: Secondary | ICD-10-CM | POA: Diagnosis not present

## 2023-11-10 DIAGNOSIS — R2689 Other abnormalities of gait and mobility: Secondary | ICD-10-CM | POA: Diagnosis not present

## 2023-11-10 DIAGNOSIS — M25561 Pain in right knee: Secondary | ICD-10-CM | POA: Diagnosis not present

## 2023-11-14 DIAGNOSIS — M25561 Pain in right knee: Secondary | ICD-10-CM | POA: Diagnosis not present

## 2023-11-14 DIAGNOSIS — Z96651 Presence of right artificial knee joint: Secondary | ICD-10-CM | POA: Diagnosis not present

## 2023-11-14 DIAGNOSIS — R2689 Other abnormalities of gait and mobility: Secondary | ICD-10-CM | POA: Diagnosis not present

## 2023-11-16 DIAGNOSIS — M25561 Pain in right knee: Secondary | ICD-10-CM | POA: Diagnosis not present

## 2023-11-16 DIAGNOSIS — Z96651 Presence of right artificial knee joint: Secondary | ICD-10-CM | POA: Diagnosis not present

## 2023-11-16 DIAGNOSIS — R2689 Other abnormalities of gait and mobility: Secondary | ICD-10-CM | POA: Diagnosis not present

## 2023-11-20 DIAGNOSIS — E785 Hyperlipidemia, unspecified: Secondary | ICD-10-CM | POA: Diagnosis not present

## 2023-11-20 DIAGNOSIS — I2510 Atherosclerotic heart disease of native coronary artery without angina pectoris: Secondary | ICD-10-CM | POA: Diagnosis not present

## 2023-11-20 DIAGNOSIS — F325 Major depressive disorder, single episode, in full remission: Secondary | ICD-10-CM | POA: Diagnosis not present

## 2023-11-20 DIAGNOSIS — E1122 Type 2 diabetes mellitus with diabetic chronic kidney disease: Secondary | ICD-10-CM | POA: Diagnosis not present

## 2023-11-20 DIAGNOSIS — I4891 Unspecified atrial fibrillation: Secondary | ICD-10-CM | POA: Diagnosis not present

## 2023-11-20 DIAGNOSIS — I130 Hypertensive heart and chronic kidney disease with heart failure and stage 1 through stage 4 chronic kidney disease, or unspecified chronic kidney disease: Secondary | ICD-10-CM | POA: Diagnosis not present

## 2023-11-20 DIAGNOSIS — D6869 Other thrombophilia: Secondary | ICD-10-CM | POA: Diagnosis not present

## 2023-11-20 DIAGNOSIS — I509 Heart failure, unspecified: Secondary | ICD-10-CM | POA: Diagnosis not present

## 2023-11-20 DIAGNOSIS — E1142 Type 2 diabetes mellitus with diabetic polyneuropathy: Secondary | ICD-10-CM | POA: Diagnosis not present

## 2023-12-06 DIAGNOSIS — I440 Atrioventricular block, first degree: Secondary | ICD-10-CM | POA: Diagnosis not present

## 2023-12-06 DIAGNOSIS — Z7901 Long term (current) use of anticoagulants: Secondary | ICD-10-CM | POA: Diagnosis not present

## 2023-12-06 DIAGNOSIS — Z8679 Personal history of other diseases of the circulatory system: Secondary | ICD-10-CM | POA: Diagnosis not present

## 2023-12-06 DIAGNOSIS — I491 Atrial premature depolarization: Secondary | ICD-10-CM | POA: Diagnosis not present

## 2023-12-15 ENCOUNTER — Ambulatory Visit: Admitting: Family Medicine

## 2024-01-10 DIAGNOSIS — Z961 Presence of intraocular lens: Secondary | ICD-10-CM | POA: Diagnosis not present

## 2024-01-10 DIAGNOSIS — H35712 Central serous chorioretinopathy, left eye: Secondary | ICD-10-CM | POA: Diagnosis not present

## 2024-01-10 DIAGNOSIS — H35722 Serous detachment of retinal pigment epithelium, left eye: Secondary | ICD-10-CM | POA: Diagnosis not present

## 2024-01-26 ENCOUNTER — Ambulatory Visit (INDEPENDENT_AMBULATORY_CARE_PROVIDER_SITE_OTHER)

## 2024-01-26 VITALS — Ht 64.5 in | Wt 155.0 lb

## 2024-01-26 DIAGNOSIS — Z0000 Encounter for general adult medical examination without abnormal findings: Secondary | ICD-10-CM | POA: Diagnosis not present

## 2024-01-26 NOTE — Patient Instructions (Addendum)
 Ernest Bell , Thank you for taking time out of your busy schedule to complete your Annual Wellness Visit with me. I enjoyed our conversation and look forward to speaking with you again next year. I, as well as your care team,  appreciate your ongoing commitment to your health goals. Please review the following plan we discussed and let me know if I can assist you in the future. Your Game plan/ To Do List     Follow up Visits: Next Medicare AWV with our clinical staff: 01/30/24 @ 10:10am televsit   Have you seen your provider in the last 6 months (3 months if uncontrolled diabetes)? Yes Next Office Visit with your provider: 05/17/24  Clinician Recommendations:  Aim for 30 minutes of exercise or brisk walking, 6-8 glasses of water , and 5 servings of fruits and vegetables each day.       This is a list of the screening recommended for you and due dates:  Health Maintenance  Topic Date Due   COVID-19 Vaccine (1) Never done   Zoster (Shingles) Vaccine (1 of 2) 05/16/1965   DTaP/Tdap/Td vaccine (3 - Tdap) 03/26/2023   Flu Shot  04/05/2024   Medicare Annual Wellness Visit  01/25/2025   Pneumonia Vaccine  Completed   Hepatitis C Screening  Completed   HPV Vaccine  Aged Out   Meningitis B Vaccine  Aged Out   Colon Cancer Screening  Discontinued   Cologuard (Stool DNA test)  Discontinued    Advanced directives: (Copy Requested) Please bring a copy of your health care power of attorney and living will to the office to be added to your chart at your convenience. You can mail to [facility] [address] [place], [place] [address] or email to [email or web address] Advance Care Planning is important because it:  [x]  Makes sure you receive the medical care that is consistent with your values, goals, and preferences  [x]  It provides guidance to your family and loved ones and reduces their decisional burden about whether or not they are making the right decisions based on your  wishes.  Follow the link provided in your after visit summary or read over the paperwork we have mailed to you to help you started getting your Advance Directives in place. If you need assistance in completing these, please reach out to us  so that we can help you!

## 2024-01-26 NOTE — Progress Notes (Signed)
 Subjective:   Ernest Downie. is a 78 y.o. who presents for a Medicare Wellness preventive visit.  As a reminder, Annual Wellness Visits don't include a physical exam, and some assessments may be limited, especially if this visit is performed virtually. We may recommend an in-person follow-up visit with your provider if needed.  Visit Complete: Virtual I connected with  Ernest Bell. on 01/26/24 by a audio enabled telemedicine application and verified that I am speaking with the correct person using two identifiers.  Patient Location: Home  Provider Location: Office/Clinic  I discussed the limitations of evaluation and management by telemedicine. The patient expressed understanding and agreed to proceed.  Vital Signs: Because this visit was a virtual/telehealth visit, some criteria may be missing or patient reported. Any vitals not documented were not able to be obtained and vitals that have been documented are patient reported.  VideoDeclined- This patient declined Librarian, academic. Therefore the visit was completed with audio only.  Persons Participating in Visit: Patient.  AWV Questionnaire: No: Patient Medicare AWV questionnaire was not completed prior to this visit.  Cardiac Risk Factors include: advanced age (>78men, >44 women);dyslipidemia;male gender     Objective:     Today's Vitals   01/26/24 1018  Weight: 155 lb (70.3 kg)  Height: 5' 4.5" (1.638 m)   Body mass index is 26.19 kg/m.     01/26/2024   10:25 AM 11/29/2022    1:38 PM 04/21/2022    9:11 AM 03/17/2022    7:32 AM 11/25/2021   12:04 PM 10/07/2021    6:28 AM 09/29/2021   11:35 AM  Advanced Directives  Does Patient Have a Medical Advance Directive? Yes Yes No No No No No  Type of Estate agent of [place];Living will Healthcare Power of [place];Living will       Copy of Healthcare Power of Attorney in Chart? No - copy requested No - copy requested        Would patient like information on creating a medical advance directive?   No - Patient declined No - Patient declined No - Patient declined No - Patient declined No - Patient declined    Current Medications (verified) Outpatient Encounter Medications as of 01/26/2024  Medication Sig   apixaban  (ELIQUIS ) 5 MG TABS tablet TAKE 1 TABLET(5 MG) BY MOUTH TWICE DAILY   ENTRESTO 49-51 MG Take 1 tablet by mouth every 12 (twelve) hours.   gabapentin (NEURONTIN) 300 MG capsule Take 1 capsule (300 mg total) by mouth in the morning AND 3 capsules (900 mg total) at bedtime.   metoprolol succinate (TOPROL-XL) 25 MG 24 hr tablet Take 0.5 tablets (12.5 mg total) by mouth daily.   rosuvastatin (CRESTOR) 10 MG tablet Take 10 mg by mouth daily.   spironolactone (ALDACTONE) 25 MG tablet Take 0.5 tablets (12.5 mg total) by mouth daily.   tadalafil  (CIALIS ) 10 MG tablet Take 1-2 tablets (10-20 mg total) by mouth daily as needed for erectile dysfunction.   No facility-administered encounter medications on file as of 01/26/2024.    Allergies (verified) Patient has no known allergies.   History: Past Medical History:  Diagnosis Date   Actinic keratosis    Atrial fib/flutter, transient (HCC) 04/28/2010   typical appearing atrial flutter dx 04/28/10 s/p CTI ablation 10/11   Basal cell carcinoma (BCC) of neck 1975   R ant neck   BPH (benign prostatic hyperplasia)    PSA stable, followed by Dr. 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 Rodolfo Clan (like due to dysautonomia)   Urinary retention    Past Surgical History:  Procedure Laterality Date   ATRIAL FIBRILLATION ABLATION  09/15/2010   [place]o at [place] ablation at [facility] 04/28/10   CARDIAC CATHETERIZATION     CARDIOVERSION N/A 04/21/2022   Procedure: CARDIOVERSION;  Surgeon: Maudine Sos, MD;  Location: [facility] ENDOSCOPY;  Service: Cardiovascular;  Laterality: N/A;   CARPAL TUNNEL RELEASE Right 1996   CARPOMETACARPAL JOINT ARTHRODESIS Right 1996   thumb   CATARACT EXTRACTION Bilateral 2017   CHOLECYSTECTOMY  1995   COLONOSCOPY  02/2000   2 hyperplastic polyps, IBS Adan Holms)   CYSTOSCOPY WITH DIRECT VISION INTERNAL URETHROTOMY N/A 03/17/2022   Procedure: CYSTOSCOPY WITH DIRECT VISION INTERNAL URETHROTOMY  OPTULUME;  Surgeon: Rea Cambridge, MD;  Location: [facility] ORS;  Service: Urology;  Laterality: N/A;   ESOPHAGOGASTRODUODENOSCOPY  02/2000   HH ([place])   GREEN LIGHT LASER TURP (TRANSURETHRAL RESECTION OF PROSTATE N/A 10/07/2021   urinary retention due to enlarged prostate after nephrectomy Rea Cambridge, MD)   HEMORROIDECTOMY  2007   KNEE ARTHROSCOPY Right 2016   torn meniscus   KNEE SURGERY  2007   he had osteoarthritis with left knee surgery   NEPHRECTOMY RADICAL Right 07/19/2021   Renal Mass on Right Kidney, surgery at [facility]   PERICARDIECTOMY  1969   at [facility]   TRANSURETHRAL MICROWAVE THERAPY  2017   Family History  Problem Relation Age of Onset   Hypertension Mother    Alcohol abuse Father        smoker   Heart disease Father        MI (smoker)   Social History   Socioeconomic History   Marital status: Married    Spouse name: Not on file   Number of children: Not on file   Years of education: Not on file   Highest  education level: Bachelor's degree (e.g., BA, AB, BS)  Occupational History   Occupation: Disabled Advertising account executive: UNEMPLOYED    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  Diet: good water , fruits/vegetables daily   Social Drivers of Health   Financial Resource Strain: Low Risk  (01/26/2024)   Overall Financial Resource Strain (CARDIA)    Difficulty of Paying Living Expenses: Not hard at all  Food Insecurity: No Food Insecurity (01/26/2024)   Hunger Vital Sign    Worried About Running Out of Food in the Last Year: Never true    Ran Out of Food in the Last Year: Never true  Transportation Needs: No Transportation Needs (01/26/2024)   PRAPARE - Administrator, Civil Service (Medical): No    Lack of Transportation (Non-Medical): No  Physical Activity: Sufficiently Active (01/26/2024)   Exercise Vital Sign    Days of Exercise per Week: 6 days    Minutes of Exercise per Session: 60 min  Stress: No Stress Concern Present (01/26/2024)   [employer] of Occupational Health - Occupational Stress Questionnaire    Feeling of Stress : Not at all  Social Connections: Socially Integrated (01/26/2024)   Social Connection and Isolation Panel [NHANES]    Frequency of Communication with Friends and Family: More than three times a week    Frequency of Social Gatherings with Friends and Family: More than three times a week    Attends Religious Services: More than 4 times per year    Active Member of [employer] or  Organizations: Yes    Attends Engineer, structural: More than 4 times per year    Marital Status: Married    Tobacco Counseling Counseling given: Not Answered    Clinical Intake:  Pre-visit preparation completed: Yes  Pain : No/denies pain     BMI - recorded: 26.19 Nutritional Status: BMI 25 -29 Overweight Nutritional Risks: None Diabetes: No  Lab Results  Component Value Date   HGBA1C 5.9 (A) 08/14/2023   HGBA1C 5.7 04/25/2023   HGBA1C 5.8 04/26/2022     How often do you need to have someone help you when you read instructions, pamphlets, or other written materials from your doctor or pharmacy?: 1 - Never  Interpreter Needed?: No  Comments: lives with wife Information entered by :: B.Immaculate Crutcher,LPN   Activities of Daily Living     01/26/2024   10:26 AM  In your present state of health, do you have any difficulty performing the following activities:  Hearing? 0  Vision? 1  Difficulty concentrating or making decisions? 0  Walking or climbing stairs? 0  Dressing or bathing? 0  Doing errands, shopping? 0  Preparing Food and eating ? N  Using the Toilet? N  In the past six months, have you accidently leaked urine? N  Do you have problems with loss of bowel control? N  Managing your Medications? N  Managing your Finances? N  Housekeeping or managing your Housekeeping? N    Patient Care Team: Claire Crick, MD as PCP - General (Family Medicine) Jolly Needle, MD (Inactive) as Consulting Physician (Cardiology) Rea Cambridge, MD as Consulting Physician (Urology) Tilden Foil, MD (Urology)  Indicate any recent Medical Services you may have received from other than [facility] providers in the past year (date may be approximate).     Assessment:    This is a routine wellness examination for Sabir.  Hearing/Vision screen Hearing Screening - Comments:: Pt says his hearing is ok for now Vision Screening - Comments:: Pt says his lft eye is little  blurry:appt at [facility] eye   Goals Addressed  This Visit's Progress    Patient Stated   On track    01/26/24- I will maintain and continue medications as prescribed.      Patient Stated   On track    01/26/24- - I will continue to play tennis 3 times a week for 2-3 hours.     Patient Stated   On track    Maintain weight.       Depression Screen     01/26/2024   10:22 AM 08/14/2023    8:35 AM 11/29/2022    1:37 PM 11/25/2021   12:01 PM 11/24/2020    8:15 AM 07/31/2019    9:54 AM 07/16/2018    9:45 AM  PHQ 2/9 Scores  PHQ - 2 Score 0 0 0 0 0 0 0  PHQ- 9 Score     0 0 0    Fall Risk     01/26/2024   10:19 AM 08/14/2023    8:35 AM 11/29/2022    1:33 PM 11/25/2021   11:57 AM 11/24/2020    8:15 AM  Fall Risk   Falls in the past year? 0 0 0 0 0  Number falls in past yr: 0  0 0 0  Injury with Fall? 0  0 0 0  Risk for fall due to : No Fall Risks  No Fall Risks Other (Comment) No Fall Risks  Risk for fall due to: Comment    neuropathy   Follow up Falls prevention discussed;Education provided  Falls prevention discussed;Falls evaluation completed Falls prevention discussed Falls evaluation completed;Falls prevention discussed    MEDICARE RISK AT HOME:  Medicare Risk at Home Any stairs in or around the home?: No If so, are there any without handrails?: No Adequate lighting in your home to reduce risk of falls?: Yes Life alert?: No Use of a cane, walker or w/c?: No Grab bars in the bathroom?: Yes Shower chair or bench in shower?: Yes Elevated toilet seat or a handicapped toilet?: No  TIMED UP AND GO:  Was the test performed?  No  Cognitive Function: 6CIT completed    11/24/2020    8:17 AM 07/31/2019    9:55 AM 07/16/2018    9:47 AM 07/07/2017    1:09 PM 04/15/2016   10:37 AM  MMSE - Mini Mental State Exam  Orientation to time 5 5 5 5 5   Orientation to Place 5 5 5 5 5   Registration 3 3 3 3 3   Attention/ Calculation 5 5 0 0 0  Recall 3 3 3 3 3   Language-  name 2 objects   0 0 0  Language- repeat 1 1 1 1 1   Language- follow 3 step command   3 3 3   Language- read & follow direction   0 0 0  Write a sentence   0 0 0  Copy design   0 0 0  Total score   20 20 20         01/26/2024   10:26 AM 11/29/2022    1:40 PM  6CIT Screen  What Year? 0 points 0 points  What month? 0 points 0 points  What time? 0 points 0 points  Count back from 20 0 points 0 points  Months in reverse 0 points 0 points  Repeat phrase 0 points 2 points  Total Score 0 points 2 points    Immunizations Immunization History  Administered Date(s) Administered   Fluad Quad(high Dose 65+) 08/12/2019, 07/20/2021   Fluad Trivalent(High Dose  65+) 05/17/2023   Influenza Whole 05/07/2011   Influenza, Seasonal, Injecte, Preservative Fre 08/18/2015   Influenza,inj,Quad PF,6+ Mos 06/27/2016, 08/21/2018   Influenza-Unspecified 06/23/2017, 06/05/2020, 06/05/2022   Pneumococcal Conjugate-13 03/03/2014   Pneumococcal Polysaccharide-23 07/26/2011   Td 01/26/2010, 03/25/2013   Zoster, Live 08/18/2015    Screening Tests Health Maintenance  Topic Date Due   COVID-19 Vaccine (1) Never done   Zoster Vaccines- Shingrix (1 of 2) 05/16/1965   DTaP/Tdap/Td (3 - Tdap) 03/26/2023   INFLUENZA VACCINE  04/05/2024   Medicare Annual Wellness (AWV)  01/25/2025   Pneumonia Vaccine 53+ Years old  Completed   Hepatitis C Screening  Completed   HPV VACCINES  Aged Out   Meningococcal B Vaccine  Aged Out   Colonoscopy  Discontinued   Fecal DNA (Cologuard)  Discontinued    Health Maintenance  Health Maintenance Due  Topic Date Due   COVID-19 Vaccine (1) Never done   Zoster Vaccines- Shingrix (1 of 2) 05/16/1965   DTaP/Tdap/Td (3 - Tdap) 03/26/2023   Health Maintenance Items Addressed: None needed at this time  Additional Screening:  Vision Screening: Recommended annual ophthalmology exams for early detection of glaucoma and other disorders of the eye.  Dental Screening:  Recommended annual dental exams for proper oral hygiene  Community Resource Referral / Chronic Care Management: CRR required this visit?  No   CCM required this visit?  No   Plan:    I have personally reviewed and noted the following in the patient's chart:   Medical and social history Use of alcohol, tobacco or illicit drugs  Current medications and supplements including opioid prescriptions. Patient is not currently taking opioid prescriptions. Functional ability and status Nutritional status Physical activity Advanced directives List of other physicians Hospitalizations, surgeries, and ER visits in previous 12 months Vitals Screenings to include cognitive, depression, and falls Referrals and appointments  In addition, I have reviewed and discussed with patient certain preventive protocols, quality metrics, and best practice recommendations. A written personalized care plan for preventive services as well as general preventive health recommendations were provided to patient.   Nerissa Bannister, LPN   05/03/5620   After Visit Summary: (MyChart) Due to this being a telephonic visit, the after visit summary with patients personalized plan was offered to patient via MyChart   Notes: Nothing significant to report at this time.

## 2024-01-30 DIAGNOSIS — Z85528 Personal history of other malignant neoplasm of kidney: Secondary | ICD-10-CM | POA: Diagnosis not present

## 2024-01-30 DIAGNOSIS — C641 Malignant neoplasm of right kidney, except renal pelvis: Secondary | ICD-10-CM | POA: Diagnosis not present

## 2024-01-30 DIAGNOSIS — Z08 Encounter for follow-up examination after completed treatment for malignant neoplasm: Secondary | ICD-10-CM | POA: Diagnosis not present

## 2024-02-02 DIAGNOSIS — H353113 Nonexudative age-related macular degeneration, right eye, advanced atrophic without subfoveal involvement: Secondary | ICD-10-CM | POA: Diagnosis not present

## 2024-02-02 DIAGNOSIS — H353221 Exudative age-related macular degeneration, left eye, with active choroidal neovascularization: Secondary | ICD-10-CM | POA: Diagnosis not present

## 2024-02-16 DIAGNOSIS — H353221 Exudative age-related macular degeneration, left eye, with active choroidal neovascularization: Secondary | ICD-10-CM | POA: Diagnosis not present

## 2024-03-06 DIAGNOSIS — I110 Hypertensive heart disease with heart failure: Secondary | ICD-10-CM | POA: Diagnosis not present

## 2024-03-06 DIAGNOSIS — I493 Ventricular premature depolarization: Secondary | ICD-10-CM | POA: Diagnosis not present

## 2024-03-06 DIAGNOSIS — I340 Nonrheumatic mitral (valve) insufficiency: Secondary | ICD-10-CM | POA: Diagnosis not present

## 2024-03-06 DIAGNOSIS — I259 Chronic ischemic heart disease, unspecified: Secondary | ICD-10-CM | POA: Diagnosis not present

## 2024-03-06 DIAGNOSIS — Z7901 Long term (current) use of anticoagulants: Secondary | ICD-10-CM | POA: Diagnosis not present

## 2024-03-06 DIAGNOSIS — I4891 Unspecified atrial fibrillation: Secondary | ICD-10-CM | POA: Diagnosis not present

## 2024-03-06 DIAGNOSIS — I5022 Chronic systolic (congestive) heart failure: Secondary | ICD-10-CM | POA: Diagnosis not present

## 2024-03-06 DIAGNOSIS — I5020 Unspecified systolic (congestive) heart failure: Secondary | ICD-10-CM | POA: Diagnosis not present

## 2024-03-06 DIAGNOSIS — I484 Atypical atrial flutter: Secondary | ICD-10-CM | POA: Diagnosis not present

## 2024-03-06 DIAGNOSIS — Z9889 Other specified postprocedural states: Secondary | ICD-10-CM | POA: Diagnosis not present

## 2024-03-06 DIAGNOSIS — C641 Malignant neoplasm of right kidney, except renal pelvis: Secondary | ICD-10-CM | POA: Diagnosis not present

## 2024-03-14 ENCOUNTER — Telehealth: Payer: Self-pay | Admitting: Family Medicine

## 2024-03-14 NOTE — Telephone Encounter (Signed)
 Copied from CRM [id number]. Topic: Referral - Request for Referral >> Mar 14, 2024  9:06 AM Geroldine GRADE wrote: Did the patient discuss referral with their provider in the last year? Yes (If No - schedule appointment) (If Yes - send message)  Appointment offered? Yes  Type of order/referral and detailed reason for visit: Patient would like a referral for a sleep study evaluation   Preference of office, provider, location: [facility] at [facility] [address] [place],  [place]  [address]  If referral order, have you been seen by this specialty before? No (If Yes, this issue or another issue? When? Where?  Can we respond through MyChart? No

## 2024-03-14 NOTE — Telephone Encounter (Signed)
 Lvm asking to call back. Needs OV to discuss sleep eval.

## 2024-03-14 NOTE — Telephone Encounter (Signed)
 Looks like pt called back and scheduled OV on 03/26/24 at 10:30.

## 2024-03-20 ENCOUNTER — Ambulatory Visit: Admitting: Family Medicine

## 2024-03-25 DIAGNOSIS — H353221 Exudative age-related macular degeneration, left eye, with active choroidal neovascularization: Secondary | ICD-10-CM | POA: Diagnosis not present

## 2024-03-26 ENCOUNTER — Ambulatory Visit: Admitting: Family Medicine

## 2024-03-26 ENCOUNTER — Encounter: Payer: Self-pay | Admitting: Family Medicine

## 2024-03-26 VITALS — BP 128/68 | HR 53 | Temp 98.0°F | Ht 64.5 in | Wt 163.0 lb

## 2024-03-26 DIAGNOSIS — I5032 Chronic diastolic (congestive) heart failure: Secondary | ICD-10-CM

## 2024-03-26 DIAGNOSIS — N1831 Chronic kidney disease, stage 3a: Secondary | ICD-10-CM | POA: Diagnosis not present

## 2024-03-26 DIAGNOSIS — Z96651 Presence of right artificial knee joint: Secondary | ICD-10-CM

## 2024-03-26 DIAGNOSIS — G4733 Obstructive sleep apnea (adult) (pediatric): Secondary | ICD-10-CM

## 2024-03-26 DIAGNOSIS — I480 Paroxysmal atrial fibrillation: Secondary | ICD-10-CM | POA: Diagnosis not present

## 2024-03-26 NOTE — Assessment & Plan Note (Addendum)
 Remote h/o mild OSA on sleep study 2011.  CPAP intolerance.  He notes worsening symptoms in the interim, h/o atrial fibrillation. Desires eval for Inspire device at [facility] E[facility] - referral placed.  No h/o central sleep apneas. Will order updated HST through [facility].  ESS today = 11

## 2024-03-26 NOTE — Assessment & Plan Note (Signed)
 Followed by [facility] cardiology on Eliquis  5mg  BID and toprol XL 12.5mg  daily.

## 2024-03-26 NOTE — Assessment & Plan Note (Signed)
 Recovering well after R knee replacement

## 2024-03-26 NOTE — Patient Instructions (Addendum)
 I will order home sleep test through [facility] diagnostics We will refer you to [facility] ENT Dr Allene for [facility] device evaluation.  Good to see you today

## 2024-03-26 NOTE — Progress Notes (Signed)
 Ph: [phone] Fax: [phone]   Patient ID: Lamar FORBES Farris Mickey., male    DOB: [birth year], 78 y.o.   MRN: [id number]  This visit was conducted in person.  BP 128/68   Pulse (!) 53   Temp 98 F (36.7 C) (Oral)   Ht 5' 4.5 (1.638 m)   Wt 163 lb (73.9 kg)   SpO2 99%   BMI 27.55 kg/m    CC: discuss sleep apnea Subjective:   HPI: Jayant Kriz. is a 78 y.o. male presenting on 03/26/2024 for Medical Management of Chronic Issues (Pt here for referral for sleep study.)   Requests referral to [facility] ENT Dr Ashok Crumble at [facility] [phone].   H/o OSA years ago, had trouble tolerating CPAP machine, felt he had recurrent sinus infections.  He is interested in evaluation for Inspire device. Requests referrals as per above. He has an aunt who really responded well to this.   Wife notes he snores, with witnessed apnea. + PNdyspnea, nonrestorative sleep. No daytime sleepiness, no morning headaches.   Underwent R total knee replacement by Dr Rubie on 09/2023 - he is back to playing tennis. Wants to fully recover prior to considering L knee replacement.   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 S/p cardioversion followed by catheter ablation at [facility] (Dr Britta) 05/2022 without subsequent afib/aflutter, but continues eliquis  5mg  bid. On Toprol XL as well as Entresto. Subsequent cardiac MRI showed new biventricular dysfunction with reduced EF 28%, mod MR, subendocardial infarct. Previously on flecainide . 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 H/o asymptomatic PACs.      Relevant past medical, surgical, family and social history reviewed and updated as indicated. Interim medical history since our last visit reviewed. Allergies and medications reviewed and updated. Outpatient Medications Prior to Visit  Medication Sig Dispense Refill   apixaban  (ELIQUIS ) 5 MG TABS tablet  TAKE 1 TABLET(5 MG) BY MOUTH TWICE DAILY 60 tablet 5   ENTRESTO 49-51 MG Take 1 tablet by mouth every 12 (twelve) hours.     gabapentin (NEURONTIN) 300 MG capsule Take 1 capsule (300 mg total) by mouth in the morning AND 3 capsules (900 mg total) at bedtime.     metoprolol succinate (TOPROL-XL) 25 MG 24 hr tablet Take 0.5 tablets (12.5 mg total) by mouth daily.     rosuvastatin (CRESTOR) 10 MG tablet Take 10 mg by mouth daily.     spironolactone (ALDACTONE) 25 MG tablet Take 0.5 tablets (12.5 mg total) by mouth daily.     tadalafil  (CIALIS ) 10 MG tablet Take 1-2 tablets (10-20 mg total) by mouth daily as needed for erectile dysfunction. 30 tablet 11   No facility-administered medications prior to visit.    Past Medical History:  Diagnosis Date   Actinic keratosis    Atrial fib/flutter, transient (HCC) 04/28/2010   typical appearing atrial flutter dx 04/28/10 s/p CTI ablation 10/11   Basal cell carcinoma (BCC) of neck 1975   R ant neck   BPH (benign prostatic hyperplasia)    PSA stable, followed by Dr. Gala   Cataract    Removed   CHF (congestive heart failure) (HCC)    Chronic kidney disease    Constrictive pericarditis 1969   unknown cause s/p pericardectomy   Coronary artery disease    Depression    on sertraline    DJD (degenerative joint disease)    Dyslipidemia 03/03/2014  Gout    Hx of kidney removal    right kidney was removed due to cancer   Liver cirrhosis secondary to NASH (nonalcoholic steatohepatitis) (HCC) 2001   ?by liver biopsy   Liver lesion    Osteoarthritis of right knee    Pericarditis, constrictive    Persistent atrial fibrillation (HCC) 09/15/2010   S/P PVI (Cryo) at [facility]   Pneumonia    Renal cell carcinoma (HCC)    Syncope 1997   recurrent S/P tilt study by Dr. Fernande (like due to dysautonomia)   Urinary retention     Past Surgical History:  Procedure Laterality Date   ATRIAL FIBRILLATION ABLATION  09/15/2010   [place] ablation at [facility] 04/28/10   CARDIAC CATHETERIZATION     CARDIOVERSION N/A 04/21/2022   Procedure: CARDIOVERSION;  Surgeon: Raford Riggs, MD;  Location: [facility] ENDOSCOPY;  Service: Cardiovascular;  Laterality: N/A;   CARPAL TUNNEL RELEASE Right 1996   CARPOMETACARPAL JOINT ARTHRODESIS Right 1996   thumb   CATARACT EXTRACTION Bilateral 2017   CHOLECYSTECTOMY  1995   COLONOSCOPY  02/2000   2 hyperplastic polyps, IBS [name])   CYSTOSCOPY WITH DIRECT VISION INTERNAL URETHROTOMY N/A 03/17/2022   Procedure: CYSTOSCOPY WITH DIRECT VISION INTERNAL URETHROTOMY  OPTULUME;  Surgeon: [name] Ozell SAUNDERS, MD;  Location: [facility] ORS;  Service: Urology;  Laterality: N/A;   ESOPHAGOGASTRODUODENOSCOPY  02/2000   HH ([place])   GREEN LIGHT LASER TURP (TRANSURETHRAL RESECTION OF PROSTATE N/A 10/07/2021   urinary retention due to enlarged prostate after nephrectomy Dallas Ozell SAUNDERS, MD)   HEMORROIDECTOMY  2007   KNEE ARTHROSCOPY Right 2016   torn meniscus   KNEE SURGERY  2007   he had osteoarthritis with left knee surgery   NEPHRECTOMY RADICAL Right 07/19/2021   Renal Mass on Right Kidney, surgery at [facility]   PERICARDIECTOMY  1969   at [facility]   TRANSURETHRAL MICROWAVE THERAPY  2017    Social History   Tobacco Use   Smoking status: Never    Passive exposure: Past   Smokeless tobacco: Never  Vaping Use   Vaping status: Never Used  Substance Use Topics   Alcohol use: No   Drug use: No    Per HPI unless specifically indicated in ROS section below Review of Systems  Objective:  BP 128/68   Pulse (!) 53   Temp 98 F (36.7 C) (Oral)   Ht 5' 4.5 (1.638 m)   Wt 163 lb (73.9 kg)   SpO2 99%   BMI 27.55 kg/m   Wt Readings from Last 3 Encounters:  03/26/24 163 lb (73.9 kg)  01/26/24 155 lb (70.3 kg)  09/12/23 155 lb (70.3 kg)      Physical Exam Vitals and nursing note reviewed.  Constitutional:      Appearance: Normal appearance. He is not ill-appearing.  HENT:     Head:  Normocephalic and atraumatic.     Mouth/Throat:     Mouth: Mucous membranes are moist.     Pharynx: Oropharynx is clear. No oropharyngeal exudate or posterior oropharyngeal erythema.  Eyes:     Extraocular Movements: Extraocular movements intact.     Pupils: Pupils are equal, round, and reactive to light.  Neck:     Thyroid : No thyroid  mass or thyromegaly.     Comments: 40cm neck circ Cardiovascular:     Rate and Rhythm: Normal rate. Rhythm irregular.     Pulses: Normal pulses.     Heart sounds: Normal heart  sounds. No murmur heard. Pulmonary:     Effort: Pulmonary effort is normal. No respiratory distress.     Breath sounds: Normal breath sounds. No wheezing, rhonchi or rales.  Musculoskeletal:     Cervical back: Normal range of motion and neck supple.     Right lower leg: No edema.     Left lower leg: No edema.  Skin:    General: Skin is warm and dry.     Findings: No rash.  Neurological:     Mental Status: He is alert.  Psychiatric:        Mood and Affect: Mood normal.        Behavior: Behavior normal.       Results for orders placed or performed in visit on 09/12/23  Bladder Scan (Post Void Residual) in office   Collection Time: 09/12/23  1:42 PM  Result Value Ref Range   Scan Result 69ml    Lab Results  Component Value Date   TSH 2.66 05/06/2021   Lab Results  Component Value Date   NA 136 04/25/2023   CL 105 04/25/2023   K 4.8 04/25/2023   CO2 24 04/25/2023   BUN 28 (H) 04/25/2023   CREATININE 1.42 04/25/2023   GFR 47.85 (L) 04/25/2023   CALCIUM 8.8 04/25/2023   ALBUMIN 4.4 04/25/2023   GLUCOSE 111 (H) 04/25/2023    Assessment & Plan:   Problem List Items Addressed This Visit     OSA (obstructive sleep apnea) - Primary   Remote h/o mild OSA on sleep study 2011.  CPAP intolerance.  He notes worsening symptoms in the interim, h/o atrial fibrillation. Desires eval for Inspire device at [facility] ENT - referral placed.  No h/o central sleep apneas. Will  order updated HST through [facility].  ESS today = 11      Relevant Orders   Ambulatory referral to ENT   Paroxysmal atrial fibrillation (HCC)   Followed by [facility] cardiology on Eliquis  5mg  BID and toprol XL 12.5mg  daily.       Chronic kidney disease, stage 3a (HCC)   Chronic heart failure with preserved ejection fraction (HFpEF) (HCC)   Status post right knee replacement   Recovering well after R knee replacement        No orders of the defined types were placed in this encounter.   Orders Placed This Encounter  Procedures   Ambulatory referral to ENT    Referral Priority:   Routine    Referral Type:   Consultation    Referral Reason:   Specialty Services Required    Requested Specialty:   Otolaryngology    Number of Visits Requested:   1    Patient Instructions  I will order home sleep test through [facility] diagnostics We will refer you to [facility] ENT Dr Allene for [facility] device evaluation.  Good to see you today   Follow up plan: No follow-ups on file.  Anton Blas, MD

## 2024-04-02 ENCOUNTER — Encounter: Payer: Self-pay | Admitting: Urology

## 2024-04-10 DIAGNOSIS — G4730 Sleep apnea, unspecified: Secondary | ICD-10-CM | POA: Diagnosis not present

## 2024-04-23 ENCOUNTER — Telehealth: Payer: Self-pay | Admitting: Family Medicine

## 2024-04-23 NOTE — Telephone Encounter (Signed)
 Plz notify home sleep study returned showing moderate sleep apnea with both obstructive and central apneas present. I will forward results to Dr Allene at [facility] ENT to have for appt next week.  See CMA box.

## 2024-04-23 NOTE — Telephone Encounter (Signed)
 Called patient reviewed all information and repeated back to me. Will call if any questions.  I have faxed to Ent as requested at number [phone].

## 2024-04-25 DIAGNOSIS — H353221 Exudative age-related macular degeneration, left eye, with active choroidal neovascularization: Secondary | ICD-10-CM | POA: Diagnosis not present

## 2024-04-25 DIAGNOSIS — H353111 Nonexudative age-related macular degeneration, right eye, early dry stage: Secondary | ICD-10-CM | POA: Diagnosis not present

## 2024-04-26 ENCOUNTER — Encounter: Payer: Self-pay | Admitting: Family Medicine

## 2024-05-02 DIAGNOSIS — Z6825 Body mass index (BMI) 25.0-25.9, adult: Secondary | ICD-10-CM | POA: Diagnosis not present

## 2024-05-02 DIAGNOSIS — G4733 Obstructive sleep apnea (adult) (pediatric): Secondary | ICD-10-CM | POA: Diagnosis not present

## 2024-05-03 ENCOUNTER — Telehealth: Payer: Self-pay

## 2024-05-03 NOTE — Telephone Encounter (Signed)
 Copied from CRM [id number]. Topic: Clinical - Lab/Test Results >> May 03, 2024 12:17 PM Jayma L wrote: Reason for CRM: patient called in asking the sleep study results be faxed over to [phone] in [place].

## 2024-05-03 NOTE — Telephone Encounter (Signed)
Patient picked up copy of results

## 2024-05-03 NOTE — Telephone Encounter (Addendum)
 Spoke with pt to get clarification of request. Pt states he is trying to establish with [facility]. Informed pt due to him not being established yet, the results cannot be faxed to them. Notified him I can print the results for him to pick up from our office or I can mail them to him. Pt states he wants to pick up results. Informed him, they will be available in the next few mins. Pt expresses his thanks and will pick up today.   Printed results and placed at front office.

## 2024-05-06 ENCOUNTER — Other Ambulatory Visit: Payer: Self-pay | Admitting: Family Medicine

## 2024-05-06 DIAGNOSIS — E785 Hyperlipidemia, unspecified: Secondary | ICD-10-CM

## 2024-05-06 DIAGNOSIS — R7303 Prediabetes: Secondary | ICD-10-CM

## 2024-05-06 DIAGNOSIS — N1831 Chronic kidney disease, stage 3a: Secondary | ICD-10-CM

## 2024-05-06 DIAGNOSIS — N400 Enlarged prostate without lower urinary tract symptoms: Secondary | ICD-10-CM

## 2024-05-09 ENCOUNTER — Other Ambulatory Visit (INDEPENDENT_AMBULATORY_CARE_PROVIDER_SITE_OTHER): Payer: Medicare PPO

## 2024-05-09 ENCOUNTER — Ambulatory Visit: Payer: Self-pay | Admitting: Family Medicine

## 2024-05-09 DIAGNOSIS — R7303 Prediabetes: Secondary | ICD-10-CM

## 2024-05-09 DIAGNOSIS — N1831 Chronic kidney disease, stage 3a: Secondary | ICD-10-CM

## 2024-05-09 DIAGNOSIS — E785 Hyperlipidemia, unspecified: Secondary | ICD-10-CM

## 2024-05-09 DIAGNOSIS — N400 Enlarged prostate without lower urinary tract symptoms: Secondary | ICD-10-CM

## 2024-05-09 LAB — CBC WITH DIFFERENTIAL/PLATELET
Basophils Absolute: 0 K/uL (ref 0.0–0.1)
Basophils Relative: 0.7 % (ref 0.0–3.0)
Eosinophils Absolute: 0.2 K/uL (ref 0.0–0.7)
Eosinophils Relative: 3.8 % (ref 0.0–5.0)
HCT: 40.3 % (ref 39.0–52.0)
Hemoglobin: 13.9 g/dL (ref 13.0–17.0)
Lymphocytes Relative: 22.3 % (ref 12.0–46.0)
Lymphs Abs: 1.4 K/uL (ref 0.7–4.0)
MCHC: 34.6 g/dL (ref 30.0–36.0)
MCV: 88.9 fl (ref 78.0–100.0)
Monocytes Absolute: 0.6 K/uL (ref 0.1–1.0)
Monocytes Relative: 10.6 % (ref 3.0–12.0)
Neutro Abs: 3.8 K/uL (ref 1.4–7.7)
Neutrophils Relative %: 62.6 % (ref 43.0–77.0)
Platelets: 192 K/uL (ref 150.0–400.0)
RBC: 4.54 Mil/uL (ref 4.22–5.81)
RDW: 13.5 % (ref 11.5–15.5)
WBC: 6.1 K/uL (ref 4.0–10.5)

## 2024-05-09 LAB — LIPID PANEL
Cholesterol: 115 mg/dL (ref 0–200)
HDL: 36.7 mg/dL — ABNORMAL LOW (ref >39.00)
LDL Cholesterol: 54 mg/dL (ref 0–99)
NonHDL: 78.12
Total CHOL/HDL Ratio: 3
Triglycerides: 121 mg/dL (ref 0.0–149.0)
VLDL: 24.2 mg/dL (ref 0.0–40.0)

## 2024-05-09 LAB — COMPREHENSIVE METABOLIC PANEL WITH GFR
ALT: 18 U/L (ref 0–53)
AST: 20 U/L (ref 0–37)
Albumin: 4.5 g/dL (ref 3.5–5.2)
Alkaline Phosphatase: 54 U/L (ref 39–117)
BUN: 21 mg/dL (ref 6–23)
CO2: 25 meq/L (ref 19–32)
Calcium: 9.1 mg/dL (ref 8.4–10.5)
Chloride: 106 meq/L (ref 96–112)
Creatinine, Ser: 1.24 mg/dL (ref 0.40–1.50)
GFR: 55.89 mL/min — ABNORMAL LOW (ref >60.00)
Glucose, Bld: 112 mg/dL — ABNORMAL HIGH (ref 70–99)
Potassium: 4.6 meq/L (ref 3.5–5.1)
Sodium: 140 meq/L (ref 135–145)
Total Bilirubin: 1.5 mg/dL — ABNORMAL HIGH (ref 0.2–1.2)
Total Protein: 6.4 g/dL (ref 6.0–8.3)

## 2024-05-09 LAB — MICROALBUMIN / CREATININE URINE RATIO
Creatinine,U: 79.9 mg/dL
Microalb Creat Ratio: UNDETERMINED mg/g (ref 0.0–30.0)
Microalb, Ur: <0.7 mg/dL

## 2024-05-09 LAB — PHOSPHORUS: Phosphorus: 4 mg/dL (ref 2.3–4.6)

## 2024-05-09 LAB — PSA: PSA: 0.36 ng/mL (ref 0.10–4.00)

## 2024-05-09 LAB — VITAMIN D 25 HYDROXY (VIT D DEFICIENCY, FRACTURES): VITD: 37.16 ng/mL (ref 30.00–100.00)

## 2024-05-09 LAB — HEMOGLOBIN A1C: Hgb A1c MFr Bld: 6.1 % (ref 4.6–6.5)

## 2024-05-10 LAB — PARATHYROID HORMONE, INTACT (NO CA): PTH: 14 pg/mL — ABNORMAL LOW (ref 16–77)

## 2024-05-17 ENCOUNTER — Ambulatory Visit: Payer: Medicare PPO | Admitting: Family Medicine

## 2024-05-17 ENCOUNTER — Encounter: Payer: Self-pay | Admitting: Family Medicine

## 2024-05-17 VITALS — BP 106/60 | HR 60 | Temp 97.5°F | Ht 65.4 in | Wt 158.4 lb

## 2024-05-17 DIAGNOSIS — G629 Polyneuropathy, unspecified: Secondary | ICD-10-CM

## 2024-05-17 DIAGNOSIS — E785 Hyperlipidemia, unspecified: Secondary | ICD-10-CM

## 2024-05-17 DIAGNOSIS — G4733 Obstructive sleep apnea (adult) (pediatric): Secondary | ICD-10-CM | POA: Diagnosis not present

## 2024-05-17 DIAGNOSIS — Z7189 Other specified counseling: Secondary | ICD-10-CM

## 2024-05-17 DIAGNOSIS — I5032 Chronic diastolic (congestive) heart failure: Secondary | ICD-10-CM

## 2024-05-17 DIAGNOSIS — R7303 Prediabetes: Secondary | ICD-10-CM

## 2024-05-17 DIAGNOSIS — C641 Malignant neoplasm of right kidney, except renal pelvis: Secondary | ICD-10-CM

## 2024-05-17 DIAGNOSIS — N1831 Chronic kidney disease, stage 3a: Secondary | ICD-10-CM

## 2024-05-17 DIAGNOSIS — N400 Enlarged prostate without lower urinary tract symptoms: Secondary | ICD-10-CM | POA: Diagnosis not present

## 2024-05-17 DIAGNOSIS — Z0000 Encounter for general adult medical examination without abnormal findings: Secondary | ICD-10-CM

## 2024-05-17 DIAGNOSIS — I429 Cardiomyopathy, unspecified: Secondary | ICD-10-CM

## 2024-05-17 DIAGNOSIS — Z23 Encounter for immunization: Secondary | ICD-10-CM | POA: Diagnosis not present

## 2024-05-17 DIAGNOSIS — I480 Paroxysmal atrial fibrillation: Secondary | ICD-10-CM

## 2024-05-17 DIAGNOSIS — K760 Fatty (change of) liver, not elsewhere classified: Secondary | ICD-10-CM

## 2024-05-17 DIAGNOSIS — Z96651 Presence of right artificial knee joint: Secondary | ICD-10-CM

## 2024-05-17 NOTE — Assessment & Plan Note (Addendum)
 S/p R nephrectomy 07/2021. Stable period followed by [facility] onc.

## 2024-05-17 NOTE — Assessment & Plan Note (Signed)
 Continue to encourage limiting added sugar in diet.

## 2024-05-17 NOTE — Assessment & Plan Note (Signed)
 S/p L nephrectomy. Latest GFR actually improved to 55 - will continue to monitor.

## 2024-05-17 NOTE — Progress Notes (Signed)
 Ph: [phone] Fax: [phone]   Patient ID: Ernest Bell., male    DOB: 1946/06/08, 78 y.o.   MRN: [id number]  This visit was conducted in person.  BP 106/60   Pulse 60   Temp (!) 97.5 F (36.4 C) (Oral)   Ht 5' 5.4 (1.661 m)   Wt 158 lb 6 oz (71.8 kg)   SpO2 98%   BMI 26.03 kg/m   BP Readings from Last 3 Encounters:  05/17/24 106/60  03/26/24 128/68  09/12/23 134/68   CC: CPE Subjective:   HPI: Ernest Bell. is a 78 y.o. male presenting on 05/17/2024 for Annual Exam   Saw health advisor 01/2024 for medicare wellness visit. Note reviewed.   No results found.  Flowsheet Row Office Visit from 05/17/2024 in [facility]  PHQ-2 Total Score 0       05/17/2024   10:48 AM 03/26/2024   10:22 AM 01/26/2024   10:19 AM 08/14/2023    8:35 AM 11/29/2022    1:33 PM  Fall Risk   Falls in the past year? 0 0 0 0 0  Number falls in past yr: 0  0  0  Injury with Fall? 0  0  0  Risk for fall due to : No Fall Risks  No Fall Risks  No Fall Risks  Follow up Falls evaluation completed  Falls prevention discussed;Education provided  Falls prevention discussed;Falls evaluation completed    Avid tennis player, has participated in senior state championships.  Established with [facility] Dr Allene for OSA with CPAP intolerance evaluating for hypoglossal nerve stimulation (HGNS) implant. May not be good candidate given high proportion of central apneas. Planned drug induced sleep endoscopy to identify areas of obstruction as well as sleep medicine consult for CPAP titration study. He may cancel this and requests sleep doctor evaluation in [name].  HST  04/2024 - mod OSA with AHI 25.5 (40% central), RDI 44.5 (23% central), desat to 87% (<0.5% of time).   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 S/p cardioversion followed by catheter ablation at  [facility] (Dr Britta) 05/2022 without subsequent afib/aflutter, but continues eliquis  5mg  bid. On Toprol XL as well as Entresto. Subsequent cardiac MRI showed new biventricular dysfunction with reduced EF 28%, mod MR, subendocardial infarct. Previously on flecainide . 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 H/o asymptomatic PACs.   H/o renal cell carcinoma R kidney s/p R nephrectomy 07/2021 with reassuring subsequent surveillance imaging. Regularly sees [facility] and urologist Dr [name] who retired, last seen 01/2024 by [name].   ?NASH cirrhosis, was followed by [facility] liver clinic. latest cirrhosis diagnosis reversed. Most recently released from GI care. Perfusion abnormality on prior imaging.  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   Sees [facility]) for neuropathy on gabapentin 300mg /900mg  daily.    Preventative: COLONOSCOPY 02/2000 - 2 hyperplastic polyps, IBS [name]).  Cologuard normal 09/2019, positive 06/2023.  Rpt colonoscopy scheduled 06/2024.  Prostate cancer screening - h/o BPH s/p TURP now off flomax - sees Dr. Gala yearly.  Lung cancer screening - not eligible  Flu shot - yearly  COVID vaccine - Moderna x2, booster x1  Pneumovax-23 2012. Prevnar-13 2015, prevnar-20 today Td 2011, 03/2013 - will rpt at pharmacy zostavax - 08/2015  Shingrix - 01/2024,  03/2024 Advanced directives: has this at home. HCPOA is wife. Full code, but would not want prolonged life support if terminal condition. Asked to bring me copy.  Seat belt use discussed  Sunscreen use discussed. No changing moles on skin.  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  Diet: good water , fruits/vegetables daily.      Relevant past medical, surgical, family and social history reviewed and updated as indicated. Interim medical history since our last visit reviewed. Allergies and medications reviewed and updated. Outpatient Medications Prior to Visit  Medication Sig Dispense Refill   apixaban  (ELIQUIS ) 5 MG TABS tablet TAKE 1 TABLET(5 MG) BY MOUTH TWICE DAILY 60 tablet 5   empagliflozin (JARDIANCE) 10 MG TABS tablet Take 10 mg by mouth daily.     ENTRESTO 49-51 MG Take 1 tablet by mouth every 12 (twelve) hours.     gabapentin (NEURONTIN) 300 MG capsule Take 1 capsule (300 mg total) by mouth in the morning AND 3 capsules (900 mg total) at bedtime.     metoprolol succinate (TOPROL-XL) 25 MG 24 hr tablet Take 0.5 tablets (12.5 mg total) by mouth daily.     rosuvastatin (CRESTOR) 10 MG tablet Take 10 mg by mouth daily.     spironolactone (ALDACTONE) 25 MG tablet Take 0.5 tablets (12.5 mg total) by mouth daily.     tadalafil  (CIALIS ) 10 MG tablet Take 1-2 tablets (10-20 mg total) by mouth daily as needed for erectile dysfunction. 30 tablet 11   No facility-administered medications prior to visit.     Per HPI unless specifically indicated in ROS section below Review of Systems  Constitutional:  Negative for activity change, appetite change, chills, fatigue, fever and unexpected weight change.  HENT:  Negative for hearing loss.   Eyes:  Negative for visual disturbance.  Respiratory:  Negative for cough, chest tightness, shortness of breath and wheezing.   Cardiovascular:  Negative for chest pain, palpitations and leg swelling.  Gastrointestinal:  Negative for abdominal distention, abdominal pain, blood in stool, constipation, diarrhea, nausea and vomiting.  Genitourinary:  Negative for difficulty urinating and hematuria.  Musculoskeletal:  Negative for  arthralgias, myalgias and neck pain.  Skin:  Negative for rash.  Neurological:  Negative for dizziness, seizures, syncope and headaches.  Hematological:  Negative for adenopathy. Does not bruise/bleed easily.  Psychiatric/Behavioral:  Negative for dysphoric mood. The patient is not nervous/anxious.     Objective:  BP 106/60   Pulse 60   Temp (!) 97.5 F (36.4 C) (Oral)   Ht 5' 5.4 (1.661 m)   Wt 158 lb 6 oz (71.8 kg)   SpO2 98%   BMI 26.03 kg/m   Wt Readings from Last 3 Encounters:  05/17/24 158 lb 6 oz (71.8 kg)  03/26/24 163 lb (73.9 kg)  01/26/24 155 lb (70.3 kg)      Physical Exam Vitals and nursing note reviewed.  Constitutional:      General: He is not in acute distress.    Appearance: Normal appearance. He is well-developed. He is not ill-appearing.  HENT:     Head: Normocephalic and atraumatic.  Right Ear: Hearing, tympanic membrane, ear canal and external ear normal.     Left Ear: Hearing, tympanic membrane, ear canal and external ear normal.     Mouth/Throat:     Mouth: Mucous membranes are moist.     Pharynx: Oropharynx is clear. No oropharyngeal exudate or posterior oropharyngeal erythema.  Eyes:     General: No scleral icterus.    Extraocular Movements: Extraocular movements intact.     Conjunctiva/sclera: Conjunctivae normal.     Pupils: Pupils are equal, round, and reactive to light.  Neck:     Thyroid : No thyroid  mass or thyromegaly.     Vascular: No carotid bruit.  Cardiovascular:     Rate and Rhythm: Normal rate and regular rhythm.     Pulses: Normal pulses.          Radial pulses are 2+ on the right side and 2+ on the left side.     Heart sounds: Normal heart sounds. No murmur heard. Pulmonary:     Effort: Pulmonary effort is normal. No respiratory distress.     Breath sounds: Normal breath sounds. No wheezing, rhonchi or rales.  Abdominal:     General: Bowel sounds are normal. There is no distension.     Palpations: Abdomen is soft. There  is no mass.     Tenderness: There is no abdominal tenderness. There is no guarding or rebound.     Hernia: No hernia is present.  Musculoskeletal:        General: Normal range of motion.     Cervical back: Normal range of motion and neck supple.     Right lower leg: No edema.     Left lower leg: No edema.  Lymphadenopathy:     Cervical: No cervical adenopathy.  Skin:    General: Skin is warm and dry.     Findings: No rash.  Neurological:     General: No focal deficit present.     Mental Status: He is alert and oriented to person, place, and time.  Psychiatric:        Mood and Affect: Mood normal.        Behavior: Behavior normal.        Thought Content: Thought content normal.        Judgment: Judgment normal.       Results for orders placed or performed in visit on 05/09/24  PSA   Collection Time: 05/09/24  7:39 AM  Result Value Ref Range   PSA 0.36 0.10 - 4.00 ng/mL  Hemoglobin A1c   Collection Time: 05/09/24  7:39 AM  Result Value Ref Range   Hgb A1c MFr Bld 6.1 4.6 - 6.5 %  CBC with Differential/Platelet   Collection Time: 05/09/24  7:39 AM  Result Value Ref Range   WBC 6.1 4.0 - 10.5 K/uL   RBC 4.54 4.22 - 5.81 Mil/uL   Hemoglobin 13.9 13.0 - 17.0 g/dL   HCT 59.6 60.9 - 47.9 %   MCV 88.9 78.0 - 100.0 fl   MCHC 34.6 30.0 - 36.0 g/dL   RDW 86.4 88.4 - 84.4 %   Platelets 192.0 150.0 - 400.0 K/uL   Neutrophils Relative % 62.6 43.0 - 77.0 %   Lymphocytes Relative 22.3 12.0 - 46.0 %   Monocytes Relative 10.6 3.0 - 12.0 %   Eosinophils Relative 3.8 0.0 - 5.0 %   Basophils Relative 0.7 0.0 - 3.0 %   Neutro Abs 3.8 1.4 - 7.7 K/uL   Lymphs  Abs 1.4 0.7 - 4.0 K/uL   Monocytes Absolute 0.6 0.1 - 1.0 K/uL   Eosinophils Absolute 0.2 0.0 - 0.7 K/uL   Basophils Absolute 0.0 0.0 - 0.1 K/uL  Parathyroid  hormone, intact (no Ca)   Collection Time: 05/09/24  7:39 AM  Result Value Ref Range   PTH 14 (L) 16 - 77 pg/mL  Microalbumin / creatinine urine ratio   Collection Time:  05/09/24  7:39 AM  Result Value Ref Range   Microalb, Ur <0.7 mg/dL   Creatinine,U 20.0 mg/dL   Microalb Creat Ratio Unable to calculate 0.0 - 30.0 mg/g  VITAMIN D  25 Hydroxy (Vit-D Deficiency, Fractures)   Collection Time: 05/09/24  7:39 AM  Result Value Ref Range   VITD 37.16 30.00 - 100.00 ng/mL  Phosphorus   Collection Time: 05/09/24  7:39 AM  Result Value Ref Range   Phosphorus 4.0 2.3 - 4.6 mg/dL  Comprehensive metabolic panel with GFR   Collection Time: 05/09/24  7:39 AM  Result Value Ref Range   Sodium 140 135 - 145 mEq/L   Potassium 4.6 3.5 - 5.1 mEq/L   Chloride 106 96 - 112 mEq/L   CO2 25 19 - 32 mEq/L   Glucose, Bld 112 (H) 70 - 99 mg/dL   BUN 21 6 - 23 mg/dL   Creatinine, Ser 8.75 0.40 - 1.50 mg/dL   Total Bilirubin 1.5 (H) 0.2 - 1.2 mg/dL   Alkaline Phosphatase 54 39 - 117 U/L   AST 20 0 - 37 U/L   ALT 18 0 - 53 U/L   Total Protein 6.4 6.0 - 8.3 g/dL   Albumin 4.5 3.5 - 5.2 g/dL   GFR 44.10 (L) >39.99 mL/min   Calcium 9.1 8.4 - 10.5 mg/dL  Lipid panel   Collection Time: 05/09/24  7:39 AM  Result Value Ref Range   Cholesterol 115 0 - 200 mg/dL   Triglycerides 878.9 0.0 - 149.0 mg/dL   HDL 63.29 (L) >60.99 mg/dL   VLDL 75.7 0.0 - 59.9 mg/dL   LDL Cholesterol 54 0 - 99 mg/dL   Total CHOL/HDL Ratio 3    NonHDL 78.12     Assessment & Plan:   Problem List Items Addressed This Visit     Health maintenance examination - Primary (Chronic)   Preventative protocols reviewed and updated unless pt declined. Discussed healthy diet and lifestyle.       Advanced directives, counseling/discussion (Chronic)   Asked to bring us  a copy      OSA (obstructive sleep apnea)   Discussed this. Recent ENT eval - not the best candidate for HGNS implant given central component.  Will refer to Dr Jess in [place].       Relevant Orders   Ambulatory referral to Pulmonology   Paroxysmal atrial fibrillation [facility])   Appreciate [facility] care - continues eliquis ,  toprol XL.       Dyslipidemia   Chronic, stable on Crestor 10mg  daily - continue. The ASCVD Risk score (Arnett DK, et al., 2019) failed to calculate for the following reasons:   The valid total cholesterol range is 130 to 320 mg/dL       NAFLD (nonalcoholic fatty liver disease)   Saw [facility] clinic - cirhosis dx reversed, released from care. Abnormality on liver imaging likely perfusion related      Prediabetes   Continue to encourage limiting added sugar in diet.       Neuropathy   Chronic, sees neurology Dr Lane on  gabapentin       Renal cell carcinoma of right kidney [facility])   S/p R nephrectomy 07/2021. Stable period followed by [facility] onc.       BPH (benign prostatic hyperplasia)   S/p TURP now off flomax. Now sees Dr Francisca      Chronic kidney disease, stage 3a [facility])   S/p L nephrectomy. Latest GFR actually improved to 55 - will continue to monitor.       Cardiomyopathy (HCC)   Followed by [facility] cards on Entresto, Toprol XL, spironolactone, jardiance      Chronic heart failure with preserved ejection fraction (HFpEF) (HCC)   Status post right knee replacement   Other Visit Diagnoses       Encounter for immunization       Relevant Orders   Flu vaccine HIGH DOSE PF(Fluzone Trivalent) (Completed)     Need for vaccination against Streptococcus pneumoniae       Relevant Orders   Pneumococcal conjugate vaccine 20-valent (Prevnar 20) (Completed)        No orders of the defined types were placed in this encounter.   Orders Placed This Encounter  Procedures   Flu vaccine HIGH DOSE PF(Fluzone Trivalent)   Pneumococcal conjugate vaccine 20-valent (Prevnar 20)   Ambulatory referral to Pulmonology    Referral Priority:   Routine    Referral Type:   Consultation    Referral Reason:   Specialty Services Required    Requested Specialty:   Pulmonary Disease    Number of Visits Requested:   1    Patient Instructions  Flu shot and prevnar-20 today  (pneumonia) You may contact [facility] in [place] to schedule an appointment: [phone] - for mixed sleep apnea evaluation.  Bring us  a copy of your advanced directives Good to see you today Return as needed or in 1 year for next physical.   Follow up plan: Return in about 1 year (around 05/17/2025) for annual exam, prior fasting for blood work.  Anton Blas, MD

## 2024-05-17 NOTE — Assessment & Plan Note (Signed)
 Appreciate [facility] care - continues eliquis , toprol XL.

## 2024-05-17 NOTE — Assessment & Plan Note (Signed)
 Chronic, sees neurology Dr Lane on gabapentin

## 2024-05-17 NOTE — Assessment & Plan Note (Signed)
 Asked to bring [place] a copy

## 2024-05-17 NOTE — Assessment & Plan Note (Signed)
 Saw [facility] clinic - cirhosis dx reversed, released from care. Abnormality on liver imaging likely perfusion related

## 2024-05-17 NOTE — Assessment & Plan Note (Signed)
 S/p TURP now off flomax. Now sees Dr Francisca

## 2024-05-17 NOTE — Patient Instructions (Addendum)
 Flu shot and prevnar-20 today (pneumonia) You may contact [facility] in [place] to schedule an appointment: [phone] - for mixed sleep apnea evaluation.  Bring us  a copy of your advanced directives Good to see you today Return as needed or in 1 year for next physical.

## 2024-05-17 NOTE — Assessment & Plan Note (Signed)
 Preventative protocols reviewed and updated unless pt declined. Discussed healthy diet and lifestyle.

## 2024-05-17 NOTE — Assessment & Plan Note (Signed)
 Chronic, stable on Crestor 10mg  daily - continue. The ASCVD Risk score (Arnett DK, et al., 2019) failed to calculate for the following reasons:   The valid total cholesterol range is 130 to 320 mg/dL

## 2024-05-17 NOTE — Assessment & Plan Note (Signed)
 Followed by [facility] cards on Entresto, Toprol XL, spironolactone, jardiance

## 2024-05-17 NOTE — Assessment & Plan Note (Addendum)
 Discussed this. Recent ENT eval - not the best candidate for HGNS implant given central component.  Will refer to Dr Jess in [place].

## 2024-05-30 ENCOUNTER — Encounter: Payer: Self-pay | Admitting: Sleep Medicine

## 2024-05-30 ENCOUNTER — Ambulatory Visit: Admitting: Sleep Medicine

## 2024-05-30 VITALS — BP 110/70 | HR 78 | Temp 97.5°F | Ht 65.4 in | Wt 161.0 lb

## 2024-05-30 DIAGNOSIS — I480 Paroxysmal atrial fibrillation: Secondary | ICD-10-CM

## 2024-05-30 DIAGNOSIS — G4739 Other sleep apnea: Secondary | ICD-10-CM | POA: Diagnosis not present

## 2024-05-30 DIAGNOSIS — I4891 Unspecified atrial fibrillation: Secondary | ICD-10-CM

## 2024-05-30 NOTE — Progress Notes (Signed)
 Name:Ernest Bell. MRN: [id number] DOB: Nov 20, 1945   CHIEF COMPLAINT:  REASSESSMENT OF OSA   HISTORY OF PRESENT ILLNESS: Ernest Bell is a 78 y.o. w/ a h/o OSA, hyperlipidemia and atrial fibrillation who present for c/o loud snoring, witnessed apnea and excessive daytime sleepiness which has been present for several years. States that he was initially diagnosed with OSA several years ago and subsequent started on CPAP therapy. Reports using CPAP therapy for a year and discontinued use due to discomfort. Denies any nocturnal awakenings. Denies any significant weight changes. Admits to dry mouth. Denies morning headaches, RLS symptoms, dream enactment, cataplexy, hypnagogic or hypnapompic hallucinations. Denies a family history of sleep apnea. Denies drowsy driving. Drinks 1/2 cup of coffee daily, denies alcohol, tobacco or illicit drug use.   The patient recently underwent HST which revealed severe mixed central and obstructive sleep apnea (AHI 44, O2 nadir 87%).   Bedtime 10-11 pm Sleep onset 15 mins Rise time 5:30-6 am   EPWORTH SLEEP SCORE 10    05/30/2024    8:41 AM  Results of the Epworth flowsheet  Sitting and reading 2  Watching TV 2  Sitting, inactive in a public place (e.g. a theatre or a meeting) 0  As a passenger in a car for an hour without a break 2  Lying down to rest in the afternoon when circumstances permit 3  Sitting and talking to someone 0  Sitting quietly after a lunch without alcohol 1  In a car, while stopped for a few minutes in traffic 0  Total score 10    PAST MEDICAL HISTORY :   has a past medical history of Actinic keratosis, Atrial fib/flutter, transient (HCC) (04/28/2010), Basal cell carcinoma (BCC) of neck (1975), BPH (benign prostatic hyperplasia), Cataract, CHF (congestive heart failure) (HCC), Chronic kidney disease, Constrictive pericarditis (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  has a past surgical history that includes Knee surgery (2007); Hemorroidectomy (2007); Cholecystectomy (1995); ATRIAL FIBRILLATION ABLATION (09/15/2010); Colonoscopy (02/2000); Esophagogastroduodenoscopy (02/2000); Pericardiectomy (1969); Nephrectomy radical (Right, 07/19/2021); Green light laser turp (transurethral resection of prostate (N/A, 10/07/2021); Carpal tunnel release (Right, 1996); Carpometacarpal joint arthrodesis (Right, 1996); Knee arthroscopy (Right, 2016); Cataract extraction (Bilateral, 2017); Cardiac catheterization; Transurethral microwave therapy (2017); Cystoscopy with direct vision internal urethrotomy (N/A, 03/17/2022); and Cardioversion (N/A, 04/21/2022). Prior to Admission medications   Medication Sig Start Date End Date Taking? Authorizing Provider  apixaban  (ELIQUIS ) 5 MG TABS tablet TAKE 1 TABLET(5 MG) BY MOUTH TWICE DAILY 04/29/22  Yes Allred, Lynwood, MD  empagliflozin (JARDIANCE) 10 MG TABS tablet Take 10 mg by mouth daily. 04/10/24  Yes [provider]  ENTRESTO 49-51 MG Take 1 tablet by mouth every 12 (twelve) hours. 07/17/23  Yes [provider]  gabapentin (NEURONTIN) 300 MG capsule Take 1 capsule (300 mg total) by mouth in the morning AND 3 capsules (900 mg total) at bedtime. 05/17/23  Yes Rilla Baller, MD  metoprolol succinate (TOPROL-XL) 25 MG 24 hr tablet Take 0.5 tablets (12.5 mg total) by mouth daily. 05/17/23  Yes Rilla Baller, MD  rosuvastatin (CRESTOR) 10 MG tablet Take 10 mg by mouth daily. 07/13/22 05/30/24 Yes [provider]  spironolactone (ALDACTONE) 25 MG tablet Take 0.5 tablets (12.5 mg total)  by mouth daily. 05/17/23   Yes Rilla Baller, MD  tadalafil  (CIALIS ) 10 MG tablet Take 1-2 tablets (10-20 mg total) by mouth daily as needed for erectile dysfunction. 09/12/23  Yes Francisca Redell BROCKS, MD   No Known Allergies  FAMILY HISTORY:  family history includes Alcohol abuse in his father; Heart disease in his father; Hypertension in his mother. SOCIAL HISTORY:  reports that he has never smoked. He has been exposed to tobacco smoke. He has never used smokeless tobacco. He reports that he does not drink alcohol and does not use drugs.   Review of Systems:  Gen:  Denies  fever, sweats, chills weight loss  HEENT: Denies blurred vision, double vision, ear pain, eye pain, hearing loss, nose bleeds, sore throat Cardiac:  No dizziness, chest pain or heaviness, chest tightness,edema, No JVD Resp:   No cough, -sputum production, -shortness of breath,-wheezing, -hemoptysis,  Gi: Denies swallowing difficulty, stomach pain, nausea or vomiting, diarrhea, constipation, bowel incontinence Gu:  Denies bladder incontinence, burning urine Ext:   Denies Joint pain, stiffness or swelling Skin: Denies  skin rash, easy bruising or bleeding or hives Endoc:  Denies polyuria, polydipsia , polyphagia or weight change Psych:   Denies depression, insomnia or hallucinations  Other:  All other systems negative  VITAL SIGNS: BP 110/70   Pulse 78   Temp (!) 97.5 F (36.4 C)   Ht 5' 5.4 (1.661 m)   Wt 161 lb (73 kg)   SpO2 97%   BMI 26.47 kg/m    Physical Examination:   General Appearance: No distress  EYES PERRLA, EOM intact.   NECK Supple, No JVD Pulmonary: normal breath sounds, No wheezing.  CardiovascularNormal S1,S2.  No m/r/g.   Abdomen: Benign, Soft, non-tender. Skin:   warm, no rashes, no ecchymosis  Extremities: normal, no cyanosis, clubbing. Neuro:without focal findings,  speech normal  PSYCHIATRIC: Mood, affect within normal limits.   ASSESSMENT AND PLAN  Complex Sleep apnea Will complete in lab CPAP/ASV  titration study and follow up to review results. Discussed the consequences of untreated sleep apnea. Advised not to drive drowsy for safety of patient and others. Will follow up to review results.    Atrial fibrillation Stable, on current management. Following with cardiology.     MEDICATION ADJUSTMENTS/LABS AND TESTS ORDERED: Recommend Sleep Study   Patient  satisfied with Plan of action and management. All questions answered  Follow up to review CPAP/TITRATION STUDY results and treatment plan.   I spent a total of 51 minutes reviewing chart data, face-to-face evaluation with the patient, counseling and coordination of care as detailed above.    Chiante Peden, M.D.  Sleep Medicine  Pulmonary & Critical Care Medicine

## 2024-05-30 NOTE — Patient Instructions (Signed)
 Will complete a CPAP titration study and follow up to review results.

## 2024-06-03 ENCOUNTER — Ambulatory Visit: Attending: Sleep Medicine

## 2024-06-03 DIAGNOSIS — G4739 Other sleep apnea: Secondary | ICD-10-CM

## 2024-06-03 DIAGNOSIS — R0683 Snoring: Secondary | ICD-10-CM | POA: Diagnosis present

## 2024-06-03 DIAGNOSIS — G4733 Obstructive sleep apnea (adult) (pediatric): Secondary | ICD-10-CM | POA: Insufficient documentation

## 2024-06-05 HISTORY — PX: COLONOSCOPY: SHX174

## 2024-06-06 DIAGNOSIS — H353113 Nonexudative age-related macular degeneration, right eye, advanced atrophic without subfoveal involvement: Secondary | ICD-10-CM | POA: Diagnosis not present

## 2024-06-06 DIAGNOSIS — H353221 Exudative age-related macular degeneration, left eye, with active choroidal neovascularization: Secondary | ICD-10-CM | POA: Diagnosis not present

## 2024-06-10 ENCOUNTER — Telehealth: Payer: Self-pay

## 2024-06-10 ENCOUNTER — Other Ambulatory Visit: Payer: Self-pay

## 2024-06-10 ENCOUNTER — Ambulatory Visit (INDEPENDENT_AMBULATORY_CARE_PROVIDER_SITE_OTHER): Payer: Self-pay | Admitting: Sleep Medicine

## 2024-06-10 DIAGNOSIS — G4739 Other sleep apnea: Secondary | ICD-10-CM

## 2024-06-10 NOTE — Telephone Encounter (Signed)
 Noted. Nothing further needed.

## 2024-06-10 NOTE — Telephone Encounter (Signed)
 Copied from CRM [id number]. Topic: Clinical - Lab/Test Results >> Jun 10, 2024  8:19 AM Essie A wrote: Reason for CRM: Patient had a sleep study done on 06/03/24.  He is awaiting a call to get the results.  Please return his call at [phone].  Thanks.

## 2024-06-10 NOTE — Progress Notes (Signed)
 BiPAP Order has been placed.

## 2024-06-11 DIAGNOSIS — H353221 Exudative age-related macular degeneration, left eye, with active choroidal neovascularization: Secondary | ICD-10-CM | POA: Diagnosis not present

## 2024-06-11 DIAGNOSIS — H353113 Nonexudative age-related macular degeneration, right eye, advanced atrophic without subfoveal involvement: Secondary | ICD-10-CM | POA: Diagnosis not present

## 2024-06-21 ENCOUNTER — Telehealth: Payer: Self-pay | Admitting: Family Medicine

## 2024-06-21 NOTE — Telephone Encounter (Signed)
 Ppwk dropped off for provider to complete placed in box at front desk

## 2024-06-24 NOTE — Telephone Encounter (Signed)
 Called patient he has addressed with pulmonology. Does not need from our office.

## 2024-06-24 NOTE — Telephone Encounter (Signed)
 I'm unclear what I need to do with this paperwork. Abnormal home sleep study was completed 04/2024 and I referred him to Dr Jess pulm who has since done in lab CPAP titration study and recommend BiPAP.  I did not fill out forms - plz verify with pt he should follow up with pulmonology for BiPAP machine.

## 2024-07-01 ENCOUNTER — Ambulatory Visit: Admitting: Sleep Medicine

## 2024-07-01 LAB — HM COLONOSCOPY

## 2024-07-02 DIAGNOSIS — K640 First degree hemorrhoids: Secondary | ICD-10-CM | POA: Diagnosis not present

## 2024-07-02 DIAGNOSIS — D122 Benign neoplasm of ascending colon: Secondary | ICD-10-CM | POA: Diagnosis not present

## 2024-07-02 DIAGNOSIS — D125 Benign neoplasm of sigmoid colon: Secondary | ICD-10-CM | POA: Diagnosis not present

## 2024-07-02 DIAGNOSIS — R195 Other fecal abnormalities: Secondary | ICD-10-CM | POA: Diagnosis not present

## 2024-07-02 DIAGNOSIS — Z1211 Encounter for screening for malignant neoplasm of colon: Secondary | ICD-10-CM | POA: Diagnosis not present

## 2024-07-02 DIAGNOSIS — I4819 Other persistent atrial fibrillation: Secondary | ICD-10-CM | POA: Diagnosis not present

## 2024-07-02 DIAGNOSIS — K5730 Diverticulosis of large intestine without perforation or abscess without bleeding: Secondary | ICD-10-CM | POA: Diagnosis not present

## 2024-07-02 DIAGNOSIS — D128 Benign neoplasm of rectum: Secondary | ICD-10-CM | POA: Diagnosis not present

## 2024-07-02 DIAGNOSIS — D127 Benign neoplasm of rectosigmoid junction: Secondary | ICD-10-CM | POA: Diagnosis not present

## 2024-07-02 DIAGNOSIS — G4733 Obstructive sleep apnea (adult) (pediatric): Secondary | ICD-10-CM | POA: Diagnosis not present

## 2024-07-02 DIAGNOSIS — I10 Essential (primary) hypertension: Secondary | ICD-10-CM | POA: Diagnosis not present

## 2024-07-02 DIAGNOSIS — Z1212 Encounter for screening for malignant neoplasm of rectum: Secondary | ICD-10-CM | POA: Diagnosis not present

## 2024-07-02 DIAGNOSIS — I2510 Atherosclerotic heart disease of native coronary artery without angina pectoris: Secondary | ICD-10-CM | POA: Diagnosis not present

## 2024-07-09 ENCOUNTER — Encounter: Payer: Self-pay | Admitting: Family Medicine

## 2024-07-16 ENCOUNTER — Encounter: Payer: Self-pay | Admitting: Pharmacist

## 2024-07-16 NOTE — Progress Notes (Signed)
 Pharmacy Quality Measure Review  This patient is appearing on a report for being at risk of failing the adherence measure for diabetes medications this calendar year.   Medication: Jardiance 10 mg Last fill date: 04/10/24 for 90 day supply  Insurance report was not up to date. No action needed at this time.  Medication has been refilled as of 07/01/24 x90ds.  Next refill due 2026

## 2024-07-23 DIAGNOSIS — H353123 Nonexudative age-related macular degeneration, left eye, advanced atrophic without subfoveal involvement: Secondary | ICD-10-CM | POA: Diagnosis not present

## 2024-08-07 DIAGNOSIS — I10 Essential (primary) hypertension: Secondary | ICD-10-CM | POA: Diagnosis not present

## 2024-08-07 DIAGNOSIS — R001 Bradycardia, unspecified: Secondary | ICD-10-CM | POA: Diagnosis not present

## 2024-08-07 DIAGNOSIS — I259 Chronic ischemic heart disease, unspecified: Secondary | ICD-10-CM | POA: Diagnosis not present

## 2024-08-07 DIAGNOSIS — I110 Hypertensive heart disease with heart failure: Secondary | ICD-10-CM | POA: Diagnosis not present

## 2024-08-07 DIAGNOSIS — I5020 Unspecified systolic (congestive) heart failure: Secondary | ICD-10-CM | POA: Diagnosis not present

## 2024-08-07 DIAGNOSIS — Z9889 Other specified postprocedural states: Secondary | ICD-10-CM | POA: Diagnosis not present

## 2024-08-07 DIAGNOSIS — I088 Other rheumatic multiple valve diseases: Secondary | ICD-10-CM | POA: Diagnosis not present

## 2024-08-07 DIAGNOSIS — I4891 Unspecified atrial fibrillation: Secondary | ICD-10-CM | POA: Diagnosis not present

## 2024-08-07 DIAGNOSIS — Z79899 Other long term (current) drug therapy: Secondary | ICD-10-CM | POA: Diagnosis not present

## 2024-08-19 ENCOUNTER — Ambulatory Visit: Admitting: Sleep Medicine

## 2024-08-26 ENCOUNTER — Encounter: Payer: Self-pay | Admitting: Sleep Medicine

## 2024-08-26 ENCOUNTER — Ambulatory Visit: Admitting: Sleep Medicine

## 2024-08-26 VITALS — BP 110/60 | HR 57 | Temp 97.8°F | Ht 65.0 in | Wt 156.6 lb

## 2024-08-26 DIAGNOSIS — G4739 Other sleep apnea: Secondary | ICD-10-CM | POA: Diagnosis not present

## 2024-08-26 DIAGNOSIS — I4891 Unspecified atrial fibrillation: Secondary | ICD-10-CM | POA: Diagnosis not present

## 2024-08-26 DIAGNOSIS — I480 Paroxysmal atrial fibrillation: Secondary | ICD-10-CM

## 2024-08-26 NOTE — Patient Instructions (Signed)

## 2024-08-26 NOTE — Progress Notes (Signed)
 "      Name:Ernest Bell. MRN: [id number] DOB: 01-18-1946   CHIEF COMPLAINT:  PAP F/U   HISTORY OF PRESENT ILLNESS: Ernest Bell is a 78 y.o. w/ a h/o complex sleep apnea, hyperlipidemia and atrial fibrillation who presents for PAP follow up visit. Reports using BiPAP therapy every night, which is confirmed by compliance data. He is currently using the Airfit F20 FFM, which is comfortable. Denies air leaks or nasal congestion. Reports feeling significantly more refreshed upon awakening with PAP therapy.    EPWORTH SLEEP SCORE 10    05/30/2024    8:41 AM  Results of the Epworth flowsheet  Sitting and reading 2  Watching TV 2  Sitting, inactive in a public place (e.g. a theatre or a meeting) 0  As a passenger in a car for an hour without a break 2  Lying down to rest in the afternoon when circumstances permit 3  Sitting and talking to someone 0  Sitting quietly after a lunch without alcohol 1  In a car, while stopped for a few minutes in traffic 0  Total score 10   PAST MEDICAL HISTORY :   has a past medical history of Actinic keratosis, Atrial fib/flutter, transient (HCC) (04/28/2010), Basal cell carcinoma (BCC) of neck (1975), BPH (benign prostatic hyperplasia), Cataract, CHF (congestive heart failure) (HCC), Chronic kidney disease, Constrictive pericarditis (1969), Coronary artery disease, Depression, DJD (degenerative joint disease), Dyslipidemia (03/03/2014), Gout, kidney removal, Liver cirrhosis secondary to NASH (nonalcoholic steatohepatitis) (HCC) (2001), Liver lesion, Osteoarthritis of right knee, Pericarditis, constrictive, Persistent atrial fibrillation (HCC) (09/15/2010), Pneumonia, Renal cell carcinoma (HCC), Syncope (1997), and Urinary retention.  has a past surgical history that includes Knee surgery (2007); Hemorroidectomy (2007); Cholecystectomy (1995); ATRIAL FIBRILLATION ABLATION (09/15/2010); Colonoscopy (02/2000); Esophagogastroduodenoscopy (02/2000); Pericardiectomy  (1969); Nephrectomy radical (Right, 07/19/2021); Green light laser turp (transurethral resection of prostate (N/A, 10/07/2021); Carpal tunnel release (Right, 1996); Carpometacarpal joint arthrodesis (Right, 1996); Knee arthroscopy (Right, 2016); Cataract extraction (Bilateral, 2017); Cardiac catheterization; Transurethral microwave therapy (2017); Cystoscopy with direct vision internal urethrotomy (N/A, 03/17/2022); Cardioversion (N/A, 04/21/2022); and Colonoscopy (06/2024). Prior to Admission medications   Medication Sig Start Date End Date Taking? Authorizing Provider  apixaban  (ELIQUIS ) 5 MG TABS tablet TAKE 1 TABLET(5 MG) BY MOUTH TWICE DAILY 04/29/22  Yes Bell, Lynwood, MD  empagliflozin (JARDIANCE) 10 MG TABS tablet Take 10 mg by mouth daily. 04/10/24  Yes [provider]  ENTRESTO 49-51 MG Take 1 tablet by mouth every 12 (twelve) hours. 07/17/23  Yes [provider]  gabapentin (NEURONTIN) 300 MG capsule Take 1 capsule (300 mg total) by mouth in the morning AND 3 capsules (900 mg total) at bedtime. 05/17/23  Yes Ernest Baller, MD  metoprolol succinate (TOPROL-XL) 25 MG 24 hr tablet Take 0.5 tablets (12.5 mg total) by mouth daily. 05/17/23  Yes Ernest Baller, MD  rosuvastatin (CRESTOR) 10 MG tablet Take 10 mg by mouth daily. 07/13/22 05/30/24 Yes [provider]  spironolactone (ALDACTONE) 25 MG tablet Take 0.5 tablets (12.5 mg total) by mouth daily. 05/17/23  Yes Ernest Baller, MD  tadalafil  (CIALIS ) 10 MG tablet Take 1-2 tablets (10-20 mg total) by mouth daily as needed for erectile dysfunction. 09/12/23  Yes Ernest Redell BROCKS, MD   No Known Allergies  FAMILY HISTORY:  family history includes Alcohol abuse in his father; Heart disease in his father; Hypertension in his mother. SOCIAL HISTORY:  reports that he has never smoked. He has been exposed to tobacco smoke. He has never  used smokeless tobacco. He reports that he does not drink alcohol and does not use  drugs.   Review of Systems:  Gen:  Denies  fever, sweats, chills weight loss  HEENT: Denies blurred vision, double vision, ear pain, eye pain, hearing loss, nose bleeds, sore throat Cardiac:  No dizziness, chest pain or heaviness, chest tightness,edema, No JVD Resp:   No cough, -sputum production, -shortness of breath,-wheezing, -hemoptysis,  Gi: Denies swallowing difficulty, stomach pain, nausea or vomiting, diarrhea, constipation, bowel incontinence Gu:  Denies bladder incontinence, burning urine Ext:   Denies Joint pain, stiffness or swelling Skin: Denies  skin rash, easy bruising or bleeding or hives Endoc:  Denies polyuria, polydipsia , polyphagia or weight change Psych:   Denies depression, insomnia or hallucinations  Other:  All other systems negative  VITAL SIGNS: BP 110/60   Pulse (!) 57   Temp 97.8 F (36.6 C)   Ht 5' 5 (1.651 m)   Wt 156 lb 9.6 oz (71 kg)   SpO2 98%   BMI 26.06 kg/m     Physical Examination:   General Appearance: No distress  EYES PERRLA, EOM intact.   NECK Supple, No JVD Pulmonary: normal breath sounds, No wheezing.  CardiovascularNormal S1,S2.  No m/r/g.   Abdomen: Benign, Soft, non-tender. Skin:   warm, no rashes, no ecchymosis  Extremities: normal, no cyanosis, clubbing. Neuro:without focal findings,  speech normal  PSYCHIATRIC: Mood, affect within normal limits.   ASSESSMENT AND PLAN  Complex Sleep apnea Due to elevated AHI will change pressure setting to auto PAP min EPAP 4 cm H2O, IPAP 20 cm H2O, pressure support 5 cm H2O. Discussed the consequences of untreated sleep apnea. Advised not to drive drowsy for safety of patient and others. Will follow up in 3 months.    Atrial fibrillation Stable, on current management. Following with cardiology.     Patient  satisfied with Plan of action and management. All questions answered  I spent a total of 21 minutes reviewing chart data, face-to-face evaluation with the patient, counseling  and coordination of care as detailed above.    Ernest Bell, M.D.  Sleep Medicine [facility]        "

## 2024-09-10 ENCOUNTER — Ambulatory Visit: Admitting: Urology

## 2024-09-10 VITALS — BP 113/59 | HR 68 | Ht 65.0 in | Wt 152.0 lb

## 2024-09-10 DIAGNOSIS — N529 Male erectile dysfunction, unspecified: Secondary | ICD-10-CM

## 2024-09-10 DIAGNOSIS — C649 Malignant neoplasm of unspecified kidney, except renal pelvis: Secondary | ICD-10-CM | POA: Diagnosis not present

## 2024-09-10 DIAGNOSIS — N401 Enlarged prostate with lower urinary tract symptoms: Secondary | ICD-10-CM

## 2024-09-10 DIAGNOSIS — N138 Other obstructive and reflux uropathy: Secondary | ICD-10-CM | POA: Diagnosis not present

## 2024-09-10 LAB — BLADDER SCAN AMB NON-IMAGING

## 2024-09-10 MED ORDER — TADALAFIL 10 MG PO TABS: 10.0000 mg | ORAL_TABLET | Freq: Every day | ORAL | 3 refills | Status: AC

## 2024-09-10 NOTE — Patient Instructions (Signed)
 Take cialis  10mg  every day with an additional 10mg  boost dose of cialis  10mg  1 hour prior to sexual activity.   Understanding Erectile Dysfunction (ED)  What is ED? Erectile Dysfunction, or ED, is when a man has trouble getting or keeping an erection enough for sex. It is common and can happen at any age but is more common as men get older.  What Causes ED? ED can happen for many reasons, including:    Stress or feeling anxious Health problems like diabetes, heart disease, or high blood pressure Lifestyle habits like smoking, drinking too much alcohol, drug use, or not enough exercise Certain medicines(some blood pressure medications, antidepressants, sedatives)  Symptoms of ED:   Difficulty getting an erection Trouble keeping an erection during sex Reduced interest in sex  Treatment Options There are different ways to treat ED. Talk to your doctor to find whats best for you.  Lifestyle Changes   Exercise regularly Eat healthy foods Quit smoking and limit alcohol Weight loss Reduce stress  Medications   Oral medicines like Viagra(sildenafil) or Cialis (tadalafil ).  These are generic and affordable, the lowest price is at [email or web address].  These must be taken about an hour before sexual activity, and still require sexual stimulation to get an erection Side effects can include stuffy nose, headache, muscle pain, or changes in vision There is no risk of heart attack or stroke when medications are taken as directed These medications cannot be taken with nitrates for chest pain  Other Treatments    Penile injections-a medicine is injected directly into the penis to give you an immediate erection Devices like pump vacuum systems that help create an erection Surgery: Penile prosthesis for patients with no improvement with medicines or injections who are still interested in sexual activity.  Visit [email or web address] for more details.  Dr. Lovie is a urologist in [facility]  with [facility] who performs penile prosthesis surgeries Counseling or Therapy    If ED is caused by stress, anxiety, or relationship issues, talking to a counselor or therapist can help.

## 2024-09-10 NOTE — Progress Notes (Signed)
" ° °  09/10/2024 1:00 PM   Ernest FORBES Links Jr. 10-04-1945 [id number]  Reason for visit: Follow up BPH/LUTS, history of bladder neck contracture/urethral stricture, ED, history of RCC  History: Previously followed by Dr. Lillette laser PVP February 2023, urethral stricture July 2023 treated with Optilume balloon dilation and bladder neck contracture laser DVIU), and [facility](right HAL nephrectomy November 2022 P T3a RCC, Fuhrman grade 2, 4 cm, renal sinus and renal vein invasion) Medical history notable for cirrhosis, managed at [facility] Initial visit with [facility] January 2024   Physical Exam: BP (!) 113/59 (BP Location: Left Arm, Patient Position: Sitting, Cuff Size: Normal)   Pulse 68   Ht 5' 5 (1.651 m)   Wt 152 lb (68.9 kg)   SpO2 97%   BMI 25.29 kg/m   Imaging/labs: PSA September 2025 normal at 0.36 CT abdomen and pelvis December 2025 NED  Today: Persistent ED despite Cialis  as needed, he never tried the daily dose with boost dose Denies any urinary symptoms, dysuria, or gross hematuria, PVR today normal at 65ml  Plan:   History of RCC: Followed by [facility], now moved to yearly surveillance, NED.  I reviewed the outside notes in Care Everywhere from [facility] urology NP PSA screening: PSA normal, no further screening needed per guideline recommended BPH/urethral stricture, bladder neck contracture/LUTS:  ED: Will trial max dose Cialis  10 mg daily with 10 mg boost dose, check morning testosterone Call with testosterone results, consider PA follow-up if testosterone low   Ernest JAYSON Burnet, MD  [facility] [address], [address] [place], [place] [address] [phone]  "

## 2024-09-11 ENCOUNTER — Ambulatory Visit: Payer: Self-pay | Admitting: Urology

## 2024-10-01 ENCOUNTER — Other Ambulatory Visit

## 2024-10-10 ENCOUNTER — Other Ambulatory Visit

## 2024-10-10 DIAGNOSIS — N138 Other obstructive and reflux uropathy: Secondary | ICD-10-CM

## 2024-10-10 DIAGNOSIS — N529 Male erectile dysfunction, unspecified: Secondary | ICD-10-CM

## 2024-10-11 ENCOUNTER — Ambulatory Visit: Payer: Self-pay | Admitting: Urology

## 2024-10-11 LAB — TESTOSTERONE: Testosterone: 552 ng/dL (ref 264–916)

## 2024-10-21 ENCOUNTER — Ambulatory Visit: Payer: Medicare PPO | Admitting: Dermatology

## 2024-11-27 ENCOUNTER — Ambulatory Visit: Admitting: Sleep Medicine

## 2025-01-29 ENCOUNTER — Ambulatory Visit

## 2025-04-10 ENCOUNTER — Ambulatory Visit

## 2025-05-13 ENCOUNTER — Other Ambulatory Visit

## 2025-05-20 ENCOUNTER — Encounter: Admitting: Family Medicine
